# Patient Record
Sex: Female | Born: 1935
Health system: Southern US, Community
[De-identification: ages and names within clinical notes are randomized; demographics above are authoritative.]

## PROBLEM LIST (undated history)

## (undated) DIAGNOSIS — I350 Nonrheumatic aortic (valve) stenosis: Secondary | ICD-10-CM

## (undated) DIAGNOSIS — Z87442 Personal history of urinary calculi: Secondary | ICD-10-CM

## (undated) DIAGNOSIS — G309 Alzheimer's disease, unspecified: Secondary | ICD-10-CM

## (undated) DIAGNOSIS — D649 Anemia, unspecified: Secondary | ICD-10-CM

## (undated) DIAGNOSIS — R011 Cardiac murmur, unspecified: Secondary | ICD-10-CM

## (undated) DIAGNOSIS — I10 Essential (primary) hypertension: Secondary | ICD-10-CM

## (undated) DIAGNOSIS — T4145XA Adverse effect of unspecified anesthetic, initial encounter: Secondary | ICD-10-CM

## (undated) DIAGNOSIS — R609 Edema, unspecified: Secondary | ICD-10-CM

## (undated) DIAGNOSIS — D696 Thrombocytopenia, unspecified: Secondary | ICD-10-CM

## (undated) DIAGNOSIS — K759 Inflammatory liver disease, unspecified: Secondary | ICD-10-CM

## (undated) DIAGNOSIS — M1990 Unspecified osteoarthritis, unspecified site: Secondary | ICD-10-CM

## (undated) DIAGNOSIS — F0280 Dementia in other diseases classified elsewhere without behavioral disturbance: Secondary | ICD-10-CM

## (undated) DIAGNOSIS — I872 Venous insufficiency (chronic) (peripheral): Secondary | ICD-10-CM

## (undated) DIAGNOSIS — T8859XA Other complications of anesthesia, initial encounter: Secondary | ICD-10-CM

## (undated) DIAGNOSIS — M8580 Other specified disorders of bone density and structure, unspecified site: Secondary | ICD-10-CM

## (undated) HISTORY — PX: TOTAL KNEE ARTHROPLASTY: SHX125

## (undated) HISTORY — PX: JOINT REPLACEMENT: SHX530

## (undated) HISTORY — PX: FRACTURE SURGERY: SHX138

## (undated) HISTORY — PX: ABDOMINAL HYSTERECTOMY: SHX81

---

## 1999-01-30 ENCOUNTER — Other Ambulatory Visit: Admission: RE | Admit: 1999-01-30 | Discharge: 1999-01-30 | Payer: Self-pay | Admitting: *Deleted

## 1999-11-21 ENCOUNTER — Emergency Department (HOSPITAL_COMMUNITY): Admission: EM | Admit: 1999-11-21 | Discharge: 1999-11-21 | Payer: Self-pay | Admitting: Emergency Medicine

## 1999-11-21 ENCOUNTER — Encounter: Payer: Self-pay | Admitting: Emergency Medicine

## 2000-03-25 ENCOUNTER — Other Ambulatory Visit: Admission: RE | Admit: 2000-03-25 | Discharge: 2000-03-25 | Payer: Self-pay | Admitting: *Deleted

## 2001-05-06 ENCOUNTER — Emergency Department (HOSPITAL_COMMUNITY): Admission: EM | Admit: 2001-05-06 | Discharge: 2001-05-07 | Payer: Self-pay | Admitting: Emergency Medicine

## 2001-05-06 ENCOUNTER — Encounter: Payer: Self-pay | Admitting: Emergency Medicine

## 2001-05-07 ENCOUNTER — Encounter: Payer: Self-pay | Admitting: Emergency Medicine

## 2001-05-10 ENCOUNTER — Encounter: Payer: Self-pay | Admitting: Urology

## 2001-05-10 ENCOUNTER — Encounter: Admission: RE | Admit: 2001-05-10 | Discharge: 2001-05-10 | Payer: Self-pay | Admitting: Urology

## 2001-05-14 ENCOUNTER — Ambulatory Visit (HOSPITAL_COMMUNITY): Admission: RE | Admit: 2001-05-14 | Discharge: 2001-05-14 | Payer: Self-pay | Admitting: Urology

## 2001-05-20 ENCOUNTER — Encounter: Payer: Self-pay | Admitting: Urology

## 2001-05-20 ENCOUNTER — Encounter: Admission: RE | Admit: 2001-05-20 | Discharge: 2001-05-20 | Payer: Self-pay | Admitting: Urology

## 2001-06-17 ENCOUNTER — Encounter: Payer: Self-pay | Admitting: Urology

## 2001-06-17 ENCOUNTER — Encounter: Admission: RE | Admit: 2001-06-17 | Discharge: 2001-06-17 | Payer: Self-pay | Admitting: Urology

## 2001-06-18 ENCOUNTER — Encounter: Admission: RE | Admit: 2001-06-18 | Discharge: 2001-06-18 | Payer: Self-pay | Admitting: Urology

## 2001-06-18 ENCOUNTER — Encounter: Payer: Self-pay | Admitting: Urology

## 2001-11-08 ENCOUNTER — Ambulatory Visit (HOSPITAL_BASED_OUTPATIENT_CLINIC_OR_DEPARTMENT_OTHER): Admission: RE | Admit: 2001-11-08 | Discharge: 2001-11-08 | Payer: Self-pay | Admitting: Orthopedic Surgery

## 2002-04-28 HISTORY — PX: LUNG LOBECTOMY: SHX167

## 2002-11-15 ENCOUNTER — Encounter: Admission: RE | Admit: 2002-11-15 | Discharge: 2002-11-15 | Payer: Self-pay | Admitting: Geriatric Medicine

## 2002-11-15 ENCOUNTER — Encounter: Payer: Self-pay | Admitting: Geriatric Medicine

## 2002-11-22 ENCOUNTER — Encounter: Admission: RE | Admit: 2002-11-22 | Discharge: 2002-11-22 | Payer: Self-pay | Admitting: Geriatric Medicine

## 2002-11-22 ENCOUNTER — Encounter: Payer: Self-pay | Admitting: Geriatric Medicine

## 2002-11-25 ENCOUNTER — Encounter: Admission: RE | Admit: 2002-11-25 | Discharge: 2002-11-25 | Payer: Self-pay | Admitting: Internal Medicine

## 2002-11-25 ENCOUNTER — Encounter: Payer: Self-pay | Admitting: Internal Medicine

## 2002-12-18 ENCOUNTER — Ambulatory Visit (HOSPITAL_COMMUNITY): Admission: RE | Admit: 2002-12-18 | Discharge: 2002-12-18 | Payer: Self-pay | Admitting: Geriatric Medicine

## 2002-12-18 ENCOUNTER — Encounter: Payer: Self-pay | Admitting: Geriatric Medicine

## 2003-02-01 ENCOUNTER — Encounter: Payer: Self-pay | Admitting: Orthopedic Surgery

## 2003-02-02 ENCOUNTER — Ambulatory Visit (HOSPITAL_COMMUNITY): Admission: RE | Admit: 2003-02-02 | Discharge: 2003-02-02 | Payer: Self-pay | Admitting: Orthopedic Surgery

## 2003-02-02 ENCOUNTER — Encounter: Payer: Self-pay | Admitting: Orthopedic Surgery

## 2003-02-06 ENCOUNTER — Encounter: Payer: Self-pay | Admitting: Orthopedic Surgery

## 2003-02-06 ENCOUNTER — Inpatient Hospital Stay (HOSPITAL_COMMUNITY): Admission: RE | Admit: 2003-02-06 | Discharge: 2003-02-10 | Payer: Self-pay | Admitting: Orthopedic Surgery

## 2003-03-30 ENCOUNTER — Encounter: Admission: RE | Admit: 2003-03-30 | Discharge: 2003-03-30 | Payer: Self-pay | Admitting: Thoracic Surgery

## 2003-04-25 ENCOUNTER — Encounter (INDEPENDENT_AMBULATORY_CARE_PROVIDER_SITE_OTHER): Payer: Self-pay | Admitting: Specialist

## 2003-04-25 ENCOUNTER — Inpatient Hospital Stay (HOSPITAL_COMMUNITY): Admission: RE | Admit: 2003-04-25 | Discharge: 2003-04-29 | Payer: Self-pay | Admitting: Thoracic Surgery

## 2003-04-29 HISTORY — PX: VASCULAR SURGERY: SHX849

## 2003-04-29 HISTORY — PX: TOTAL SHOULDER ARTHROPLASTY: SHX126

## 2003-05-11 ENCOUNTER — Encounter: Admission: RE | Admit: 2003-05-11 | Discharge: 2003-05-11 | Payer: Self-pay | Admitting: Thoracic Surgery

## 2003-06-01 ENCOUNTER — Encounter: Admission: RE | Admit: 2003-06-01 | Discharge: 2003-06-01 | Payer: Self-pay | Admitting: Thoracic Surgery

## 2003-07-13 ENCOUNTER — Encounter: Admission: RE | Admit: 2003-07-13 | Discharge: 2003-07-13 | Payer: Self-pay | Admitting: Thoracic Surgery

## 2003-10-10 ENCOUNTER — Encounter: Admission: RE | Admit: 2003-10-10 | Discharge: 2003-10-10 | Payer: Self-pay | Admitting: Thoracic Surgery

## 2004-03-09 ENCOUNTER — Encounter: Admission: RE | Admit: 2004-03-09 | Discharge: 2004-03-09 | Payer: Self-pay | Admitting: Orthopedic Surgery

## 2004-04-24 ENCOUNTER — Inpatient Hospital Stay (HOSPITAL_COMMUNITY): Admission: RE | Admit: 2004-04-24 | Discharge: 2004-04-26 | Payer: Self-pay | Admitting: Orthopedic Surgery

## 2006-08-04 ENCOUNTER — Encounter: Admission: RE | Admit: 2006-08-04 | Discharge: 2006-08-04 | Payer: Self-pay | Admitting: Geriatric Medicine

## 2006-09-22 ENCOUNTER — Ambulatory Visit: Payer: Self-pay | Admitting: Vascular Surgery

## 2006-11-11 ENCOUNTER — Ambulatory Visit (HOSPITAL_BASED_OUTPATIENT_CLINIC_OR_DEPARTMENT_OTHER): Admission: RE | Admit: 2006-11-11 | Discharge: 2006-11-11 | Payer: Self-pay | Admitting: Orthopedic Surgery

## 2006-12-22 ENCOUNTER — Ambulatory Visit: Payer: Self-pay | Admitting: Vascular Surgery

## 2007-02-08 ENCOUNTER — Ambulatory Visit: Payer: Self-pay | Admitting: Vascular Surgery

## 2007-02-15 ENCOUNTER — Ambulatory Visit: Payer: Self-pay | Admitting: Vascular Surgery

## 2007-05-10 ENCOUNTER — Ambulatory Visit: Payer: Self-pay | Admitting: Vascular Surgery

## 2007-05-17 ENCOUNTER — Ambulatory Visit: Payer: Self-pay | Admitting: Vascular Surgery

## 2007-11-16 ENCOUNTER — Ambulatory Visit: Payer: Self-pay | Admitting: Vascular Surgery

## 2008-05-25 ENCOUNTER — Encounter: Admission: RE | Admit: 2008-05-25 | Discharge: 2008-05-25 | Payer: Self-pay | Admitting: Geriatric Medicine

## 2009-10-12 ENCOUNTER — Encounter (INDEPENDENT_AMBULATORY_CARE_PROVIDER_SITE_OTHER): Payer: Self-pay | Admitting: Orthopedic Surgery

## 2009-10-12 ENCOUNTER — Ambulatory Visit: Payer: Self-pay | Admitting: Vascular Surgery

## 2009-10-12 ENCOUNTER — Ambulatory Visit: Admission: RE | Admit: 2009-10-12 | Discharge: 2009-10-12 | Payer: Self-pay | Admitting: Orthopedic Surgery

## 2010-05-18 ENCOUNTER — Encounter: Payer: Self-pay | Admitting: Thoracic Surgery

## 2010-09-10 NOTE — Assessment & Plan Note (Signed)
OFFICE VISIT   ANZAL, BARTNICK  DOB:  04/30/1930                                       05/17/2007  [id number]#:[id number]   The patient returns 1 week post laser ablation of her left greater  saphenous vein with multiple stab phlebectomies (greater than 30) for  severe venous insufficiency of the left leg with painful varicosities.  She has had an excellent early result with very mild discomfort in the  proximal thigh over the course of the greater saphenous vein, which has  improved significantly.  She has had no pain associated with the stab  phlebectomy sites and has had no change in distal edema.  She does not  have any throbbing or aching discomfort.  She has worn her elastic  compression stocking as prescribed.  I performed a venous duplex exam  today, and her left greater saphenous vein is totally occluded from just  proximal to the saphenofemoral junction down to the mid thigh at the  entrance site.  The deep venous flow is normal with no evidence of deep  venous obstruction.   EXAM:  she has no distal edemas in the leg and the stab phlebectomy  sites are healing nicely.  I have reassured her regarding these  findings.  She will continue to wear elastic compression stockings.  Return to see me in 6 months for final followup with bilateral venous  duplex exams.   [name], M.D.  Electronically Signed   JDL/MEDQ  D:  05/17/2007  T:  05/18/2007  Job:  [id number]

## 2010-09-10 NOTE — Assessment & Plan Note (Signed)
OFFICE VISIT   GENOA, FREYRE  DOB:  [birth year]                                       02/15/2007  WUXLK#:[id number]   Patient returns one week status post laser ablation of the right greater  saphenous vein with multiple stab phlebectomies in the thigh and calf.  She also required cannulation of the greater saphenous vein in two  separate areas because of some chronic mild obstruction in the mid  thigh.  She has had an excellent early result with no discomfort  associated with stab phlebectomies.  She has had some mild aching in the  thigh related to the laser ablation of the greater saphenous vein.  There is also some moderate bruising.  The biggest problem she had was  the elastic compression stocking which caused her pain in the thigh  because of the size of her thigh.  This required her cutting the  stocking down to the knee level and wrapping it with an Ace.   PHYSICAL EXAMINATION:  On exam today, there is no distal edema.  The  stab phlebectomy wounds are all healing nicely in the calf and thigh.  There is moderate bruising in the right thigh distally.   I performed a venous duplex exam in the office today, and the right  greater saphenous vein is totally occluded from near the saphenofemoral  junction to the knee.  Widely patent deep venous system.  No evidence of  venous obstruction.  Normal flow into the deep venous system.  She was  reassured regarding these findings and will return soon to have a  similar procedure performed on the contralateral left leg.   [name], M.D.  Electronically Signed   JDL/MEDQ  D:  02/15/2007  T:  02/16/2007  Job:  [id number]

## 2010-09-10 NOTE — Procedures (Signed)
DUPLEX DEEP VENOUS EXAM - LOWER EXTREMITY   INDICATION:  Follow up bilateral greater saphenous vein ablations.   HISTORY:  Edema:  No.  Trauma/Surgery:  Right greater saphenous vein ablation, 02/08/07.  Left  greater saphenous vein ablation, 05/10/07.  Both by Dr. Hart Rochester.  Pain:  No.  PE:  No.  Previous DVT:  No.  Anticoagulants:  No.  Other:   DUPLEX EXAM:                CFV   SFV   PopV  PTV    GSV                R  L  R  L  R  L  R   L  R  L  Thrombosis    o  o  o  o  o  o  o   o  +  +  Spontaneous   +  +  +  +  +  +  +   +  D  0  Phasic        +  +  +  +  +  +  +   +  D  0  Augmentation  +  +  +  +  +  +  +   +  D  0  Compressible  +  +  +  +  +  +  +   +  P  0  Competent     +  +  +  +  +  +  +   +  0  0   Legend:  + - yes  o - no  p - partial  D - decreased   IMPRESSION:  1. No evidence of deep venous thrombosis in bilateral lower      extremities.  2. Right greater saphenous vein status post ablation, appears to be      recannulized in a dual-branch system with intermittent chronic      thrombus throughout greater saphenous vein.  3. Left greater saphenous vein, status post ablation, shows no      evidence of flow from groin to distal insertion site.  4. Right short saphenous vein shows evidence of chronic thrombus.      Left shows evidence of reflux; however, the diameter is small.       _____________________________  Quita Skye. Hart Rochester, M.D.   AS/MEDQ  D:  11/16/2007  T:  11/16/2007  Job:  [id number]

## 2010-09-10 NOTE — Assessment & Plan Note (Signed)
OFFICE VISIT   TA, FAIR  DOB:  July 29, 1930                                       12/22/2006  [id number]#:[id number]   [name] returns now 3 months' post evaluation for severe venous  insufficiency of both lower extremities.  She has gross reflux in the  saphenous femoral junction bilaterally with reflux throughout the  greater saphenous veins and has had severe burning and heaviness in both  lower extremities as well as some itching discomfort.  The elastic  compression stockings are very difficult for her to wear and she has had  no improvement in her symptoms from these.  She has not been able to  obtain relief from pain medication or elevation of the legs and these  are definitely effecting her daily living.  She has had history of  thrombophlebitis in the right leg which has now resolved.   PHYSICAL EXAMINATION:  Blood pressure 195/96, heart rate 79,  respirations are 18.  Her severe varicosities involved with this both  thigh and calf bilaterally with the left thigh being worse than the  right and the right calf being worse than the left. There are also  severe spider veins and reticular veins near the lower third of the leg  and in the ankle with 1+ edema bilaterally.  She has 3+ dorsalis pedis  pulse.   We will proceed with precertification for bilateral laser ablation of  the great saphenous veins with stat phlebectomies beginning with the  right side.   [name], M.D.  Electronically Signed   JDL/MEDQ  D:  12/22/2006  T:  12/23/2006  Job:  [id number]

## 2010-09-10 NOTE — Op Note (Signed)
NAMEMARYLYN, [name]            ACCOUNT NO.:  [id number]   MEDICAL RECORD NO.:  [id number]          PATIENT TYPE:  AMB   LOCATION:  DSC                          FACILITY:  [facility]   PHYSICIAN:  Artist Pais. Weingold, M.D.DATE OF BIRTH:  03-16-1931   DATE OF PROCEDURE:  11/11/2006  DATE OF DISCHARGE:                               OPERATIVE REPORT   PREOPERATIVE DIAGNOSIS:  Displaced intra-articular fracture distal  radius left with carpal tunnel symptoms.   POSTOPERATIVE DIAGNOSES:  Displaced intra-articular fracture distal  radius left with carpal tunnel symptoms.   PROCEDURE:  Open reduction internal fixation of above with DVR plate and  screw, and left carpal tunnel release through separate incision.   SURGEON:  Artist Pais. Mina Marble, M.D.   ASSISTANT:  None.   ANESTHESIA:  General.   TOURNIQUET TIME:  50 minutes.   COMPLICATIONS:  None.   DRAINS:  None.   OPERATIVE REPORT:  The patient taken to operating suite.  After  induction of general anesthesia, left upper extremity was prepped and  draped in usual sterile fashion.  An Esmarch was used to exsanguinate  the limb.  Tourniquet inflated to 250 mm.  At this point in time  incision was made over the palpable tendon FCR longitudinally.  Skin was  incised 6 to 8 cm.  The sheath overlying the FCR was incised.  It was  retracted to the midline.  The radial artery lateral side, the fascia  overlying this area was incised.  Dissection was carried down to the  level of pronator quadratus.  This was subperiosteally stripped off the  lower aspect of the distal radius exposing the fracture site.  The  fracture had a large radial styloid fragment and some intra-articular  comminution along the ulnar side.  Reduction was performed, held with a  reduction clamp to stabilize the large radial styloid fragment.  A  DVRL  standard plate was then fixed to the volar aspect of the distal radius  through the slotted hole.  Intraoperative  fluoroscopy revealed good  reduction with both AP lateral and oblique view.  Remaining proximal  screws were placed followed by the smooth pegs distally.  Once this was  completed, the wound was irrigated.  Seconds incision was made in the  palmar aspect of the left hand in line with long femoral carpals  starting Kaplan's cardinal line.  Skin was incised, palmar fascia  identified and split, the distal edge of the transverse carpal lumen was  then divided under vision using curved blunt scissors, canal was  inspected.  There was significant amount of blood which was irrigated  out.  No osseous lesions or ganglions were present.  It too was then  loosely closed with 3-0 Prolene subcuticular stitch.  Steri-Strips, 4x4s  fluffs and a volar splint was applied.  The patient tolerated both  procedures well and went to recovery in stable the fashion.      Artist Pais Mina Marble, M.D.  Electronically Signed     MAW/MEDQ  D:  11/11/2006  T:  11/12/2006  Job:  [id number]

## 2010-09-10 NOTE — Assessment & Plan Note (Signed)
OFFICE VISIT   [name], [name]  DOB:  January 28, 1931                                       11/16/2007  [id number]#:[id number]   The patient returns for 48-month followup regarding bilateral laser  ablation procedures and multiple stab phlebectomies for severe venous  insufficiency in both legs, right worse than left.  She had between 20  and 30 stab phlebectomies in both lower extremities.  Both legs are  asymptomatic, she states, with no aching or throbbing and dramatically  improved from her preoperative state.  She has had no distal edema and  has not been wearing elastic compression stockings since her second  procedure.  She has also had no recurrent thrombophlebitis, which she  did have preop.  She has had no claudication or cardiac symptoms and  denies any TIAs.   PHYSICAL EXAMINATION:  Blood pressure 189/88, heart rate is 70,  respirations 14.  Carotid pulses 3+.  No audible bruits.  Chest:  Clear  to auscultation.  Abdomen is soft, nontender with no masses.  She has 3+  femoral, popliteal, and dorsalis pedis pulses palpable bilaterally.  Both legs have minimal distal edema with multiple spider veins, but no  large varicosities remaining.  There is no active ulceration or  hyperpigmentation.   Venous duplex exam was performed, which revealed total closure of the  left great saphenous vein from the saphenofemoral junction to the knee.  Left deep system is widely patent.  On the right side, the deep system  is widely patent.  There has been some recanalization in a branch and  dual system in the mid thigh extending up to the saphenofemoral  junction, but there is also partial closure in this area.  She did have  a complicated ablation on the right requiring 2 areas of cannulization,  although the immediate post ablation picture revealed this to be totally  noncompressible.   Right leg does have some recanalization, but she is totally asymptomatic  and no  treatment is indicated.  She will return to see [place] on a p.r.n.  basis.   [name], M.D.  Electronically Signed   JDL/MEDQ  D:  11/16/2007  T:  11/17/2007  Job:  [id number]

## 2010-09-13 NOTE — Discharge Summary (Signed)
[name], [name]                        ACCOUNT NO.:  [id number]   MEDICAL RECORD NO.:  [id number]                   PATIENT TYPE:  INP   LOCATION:  3309                                 FACILITY:  [facility]   PHYSICIAN:  Ines Bloomer, M.D.              DATE OF BIRTH:  Oct 23, 1930   DATE OF ADMISSION:  04/25/2003  DATE OF DISCHARGE:  04/29/2003                                 DISCHARGE SUMMARY   ADMISSION DIAGNOSIS:  Right lower lobe mass.   DISCHARGE DIAGNOSES:  1. 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  2. Hypertension.  3. Remote history of hepatitis.  4. Remote childhood kidney infection with no sequelae.  5. Positive PPD in the past, per patient report.  6. Query aortic stenosis versus sclerosis.  7. Previous uterine cancer.  8. Previous left knee replacement.   HOSPITAL PROCEDURES:  Right video-assisted thoracoscopic surgery, mini-  thoracotomy, wedge resection of right lower lobe lesion, completed on  April 25, 2003, by Dr. Edwyna Shell.   CONSULTATIONS:  None.   HISTORY OF PRESENT ILLNESS:  [name] is a 77 year old female who was  referred by Dr. [name] through Dr. Donata Clay for evaluation of a lung mass.  The patient was found to have preoperative chest x-ray done for a left knee  replacement, this mass was found.  Reportedly, there was a mass in the right  lower lobe and this was confirmed by a CT scan.  The patient had been  asymptomatic from this at the time of presentation.  She felt well and had  no significant cough, sputum production, fever or chills, weight loss,  shortness of breath, dyspnea on exertion, chest pain, hemoptysis, or other  symptoms relating to her pulmonary review.  The patient was seen by Dr.  Edwyna Shell in the clinic at which time he discussed a video-thorascopic surgery  for wedge resection of this mass.  The risks, benefits, and complications,  were discussed with the  patient at that time and she agreed to proceed with  surgery.  The surgery was tentatively planned for April 25, 2003, at  [facility].   HOSPITAL COURSE:  [name] was admitted to [facility] on April 25, 2003, and underwent right video-assisted thorascopic surgery, mini-  thoracotomy, wedge resection of right lower lobe lesion, completed by Dr.  Edwyna Shell.  The patient tolerated this procedure well and was extubated on the  operating room table without difficulty.  The patient was transferred to the  post anesthesia care unit in stable condition.  Once awake, alert, and  appropriate, the patient was transferred to 3300.   The patient remained stable on the night of surgery without any issues.   On postoperative day #1, the patient was feeling fairly well.  Her pain was  well controlled.  She denied any nausea or vomiting, shortness of breath,  or  other pulmonary problems.  Her chest x-ray showed no pneumothorax and  bibasilar atelectasis with some subcutaneous air.  Her anterior chest tube  was discontinued without difficulty on that day.   Over the next two hospital days, the patient continued to do quite well.  Her second chest tube was removed on postoperative day #2 without any  difficulty.  Her post chest tube pull chest x-ray showed no pneumothorax.  The patient's invasive lines were discontinued.  Her Foley was discontinued.  She was ambulating in the hallway with the mobility team.  She was  saturating well on room air.  She was tolerating a regular diet.   Discharge planning was initiated then on postoperative day #3 or April 28, 2003.   LABORATORY DATA:  The patient's laboratory values at the time of discharge  planning were as follows:  April 27, 2003, sodium 133, potassium 3.0,  chloride 97, CO2 31, glucose 124, BUN 8, creatinine 0.6.  The patient's  liver function tests were within normal limits.  CBC from December 29, reads  as follows:   WBC 7.1, hemoglobin 10.9, hematocrit 32.8, platelet count 156.  Chest x-ray at the time of discharge planning, showed residual atelectasis  of both lower lobes and no pneumothorax.   The patient was ambulating without difficulty.  She was tolerating a regular  diet.  She had good pain control with oral pain medications.  She had a  bowel movement and was urinating without difficulty.  The patient's  incisions were healing well without erythema or drainage.   We will continue as planned with discharge on April 29, 2003, pending  morning rounds and no change in clinical status.   DISCHARGE MEDICATIONS:  1. Hydrochlorothiazide 25 mg daily.  2. Potassium tablets 20 mEq twice daily for 5 days.  3. Tylox 1-2 tablets every 4-6 hours as needed for pain.   ALLERGIES:  No known drug allergies.   DISCHARGE INSTRUCTIONS:  1. Activity:  The patient should avoid driving.  She should avoid strenuous     activity or heavy lifting.  She should walk daily.  2. Diet:  No restrictions.  3. Wound care:  The patient may shower starting April 29, 2003.  She should     wash her incisions daily with soap and water.  4. Special instructions:  The patient should notify [facility] if she has any     increased redness, swelling, or drainage     from her incisions.  5. Followup appointments:  The patient is to see Dr. Edwyna Shell in 7-10 days     with a chest x-ray.  The [facility] office will call the patient with the     appointment date and time.      Carolyn A. Eustaquio Boyden.                  Ines Bloomer, M.D.    CAF/MEDQ  D:  04/28/2003  T:  04/28/2003  Job:  [id number]   cc:   Hal T. Stoneking, M.D.  [address]  [place], [place] [address]  Fax: [phone]   John L. Rendall III, M.D.  [address] [place]  [place] [address]  Fax: [phone]

## 2010-09-13 NOTE — Discharge Summary (Signed)
[name], [name]            ACCOUNT NO.:  [id number]   MEDICAL RECORD NO.:  [id number]          PATIENT TYPE:  INP   LOCATION:  0449                         FACILITY:  [facility]   PHYSICIAN:  John L. Rendall, M.D.  DATE OF BIRTH:  1931/02/25   DATE OF ADMISSION:  04/24/2004  DATE OF DISCHARGE:  04/26/2004                                 DISCHARGE SUMMARY   ADMISSION DIAGNOSES:  1.  End-stage osteoarthritis right shoulder.  2.  Hypertension.  3.  History of right lung hamartoma removed December 2004.  4.  Herniated nucleus pulposus lumbar spine.   DISCHARGE DIAGNOSES:  1.  Right shoulder hemiarthroplasty.  2.  History of hypertension.  3.  History of right lung hamartoma.  4.  History of herniated nucleus pulposus lumbar spine.   HISTORY OF PRESENT ILLNESS:  The patient is a 77 year old white female with  a history of left total knee arthroplasty and left shoulder arthroplasty  that presents with 3- to 4-year history of progressively worsening right  shoulder pain.  The right shoulder rotator cuff is nonreparable at the  present time.  The pain is intermittent, aching, over the right deltoid with  some radiation in to the neck at times.  The pain worsens with range of  motion.  It improves with occasional cortisone injection.  The patient is  able to sleep on her right side.   ALLERGIES:  No known drug allergies.   CURRENT MEDICATIONS:  The patient denies any.   SURGICAL PROCEDURE:  On April 24, 2004 the patient was taken to the OR by  Dr. Erasmo Leventhal, assisted by Rexene Edison, P.A.-C.  Under general anesthesia  the patient underwent a right shoulder hemiarthroplasty with a CTA  arthroplasty component.  The patient tolerated the procedure well, there  were no complications, and the following components were implanted:  A  global advantage 56 x 23 mm CTA head with a size 12 145 length humeral stem.  The patient tolerated the procedure well, no complications.  Transferred  to  recovery room in good condition.   HOSPITAL COURSE:  On April 24, 2004 the patient was admitted to [facility] under the care of Dr. Jonny Ruiz Rendall.  The patient was taken to  the OR where a right shoulder hemiarthroplasty was performed.  The patient  tolerated the procedure well.  She was transferred to the recovery room and  then to the orthopedic floor in good condition with IV antibiotics and pain  medications.  The patient was able to transition over to p.o. medications  and was quite comfortable after a 2-day hospital stay.  Her wound was benign  for any signs of infection.  Her arm was neuromotor-vascularly intact.  The  incision was well approximated with staples and the patient was comfortable  so she was discharged to home in good condition with follow-up in 1 week,  with good control with p.o. pain medications.   LABORATORY DATA:  CBC on December 30:  WBC 6.9, hemoglobin 11.3, hematocrit  32.8, platelets 127.  Routine chemistries:  Sodium 136, potassium 3.5,  glucose 131 -  felt to be postoperative stress.   MEDICATIONS UPON DISCHARGE FROM ORTHOPEDICS FLOOR:  1.  Colace 100 mg p.o. b.i.d.  2.  Trinsicon one tablet p.o. t.i.d.  3.  Benadryl 25 mg p.o. q.6h. p.r.n. itching.  4.  Percocet one or two tablets q.4-6h. p.r.n.  5.  Tylenol 650 mg p.o. q.6h. p.r.n.  6.  Robaxin 500 mg p.o. q.6h. p.r.n.  7.  Restoril 15 mg p.o. q.h.s. p.r.n.   DISCHARGE INSTRUCTIONS:  1.  The patient is to use routine medications.  2.  Percocet 5 mg one or two tablets q.4-6h. for pain if needed.  3.  Activity:  Ss instructed by the therapist.  4.  Diet:  No restrictions.  5.  Wound care:  The patient is to keep the wound clean, change dressing      every-other day.  If any concerns from infections or any other problems,      the patient is to call Dr. Sable Feil office for advice.  6.  Follow-up:  The patient should call [phone] for a follow-up appointment      in 2 weeks with Dr.  [name] in his office.   CONDITION UPON DISCHARGE TO HOME:  Listed as improved and good.      RWK/MEDQ  D:  05/06/2004  T:  05/06/2004  Job:  [id number]

## 2010-09-13 NOTE — Op Note (Signed)
NAMEJALEN, [name]                        ACCOUNT NO.:  [id number]   MEDICAL RECORD NO.:  [id number]                   PATIENT TYPE:  INP   LOCATION:  2550                                 FACILITY:  [facility]   PHYSICIAN:  John L. Rendall III, M.D.           DATE OF BIRTH:  August 15, 1930   DATE OF PROCEDURE:  02/06/2003  DATE OF DISCHARGE:                                 OPERATIVE REPORT   PREOPERATIVE DIAGNOSIS:  Osteoarthritis left knee.   PROCEDURE:  Left LCS total knee replacement.   POSTOPERATIVE DIAGNOSIS:  Osteoarthritis left knee.   SURGEON:  John L. Rendall, M.D.   ASSISTANT:  Madilyn Fireman, P.A.-C.   ANESTHESIA:  General.   PATHOLOGY:  The patient has bone crumbling on the lateral femoral cortex and  bear bone on the medial femoral weightbearing surface.  The knee is inflamed  and arthroscopy in the last six weeks really was unable to help with this  pathology and the total knee was recommended at that time.   DESCRIPTION OF PROCEDURE:  Under general anesthesia, a midline incision was  made after a routine prep and drape and application of sterile tourniquet.  The patella was everted.  The femur was sized at a standard plus.  Using the  first tibial guide, a proximal tibial resection was carried out.  Using the  femoral guide, an intercondylar drill hole was placed.  Using the second  femoral guide, the anterior and posterior flare of the distal femur were  resected with a balance 10 mm flexion gap.  Intermedullary guide was then  used and a distal femoral cut was made with a 10 mm extension gap after the  second cut.  The recessing guide was then used.  Lamina spreaders were then  inserted.  Remnants of the menisci and cruciates were resected and spurs off  the back of the femoral condyle were removed with osteotome and mallet.  Attention was then turned to the proximal tibia which was exposed with  Meckel posteriorly, Homan laterally, and Rake medially.  It was sized  at a  #4.  Center peg hole was placed and then trial reduction of a #4 tibia, 10  mm rotating bearing, and standard plus femur revealed excellent fit and  alignment.  Patella was osteotomized and three peg holes placed there.  Bony  surfaces were then prepared with pressure irrigation and all components were  then cemented in place.  In the process of driving down the tibial  component, a crack occurred in the metaphysis of the medial tibia involving  about 30% of the bone by estimate.  This was held together with bone clamp  in a near anatomic position while the cement hardened.  Alpha BSM was  injected and bone graft was used so that when it was done, you could not  tell there had been a crack.  To further stabilize this area, a Synthes 7.3  screw  cannulated was placed transversely across the proximal tibia from  medial to lateral directly behind the tibial tubercle.  The screw was placed  percutaneously medially and further compressed this area. It was inserted  just prior to cement hardening.  Once this was completed and the cement  hardened, excess cement was removed.  The tourniquet was let down at one  hour.  Excess cement was removed.  Minor debridement was carried out. The  wound was copiously irrigated with saline and the antibiotic solution. It  was then closed in layers with #1  Tycron, #1 Vicryl, 2-0 Vicryl, and skin clips.  It should be noted that upon  completion of stabilization of the knee, it was stable through full range of  motion to varus valgus stress, anterior posterior drawer, and the fracture  was solidly fixed.                                               John L. Dorothyann Gibbs, M.D.    Renato Gails  D:  02/06/2003  T:  02/06/2003  Job:  [id number]

## 2010-09-13 NOTE — H&P (Signed)
[name], [name]                        ACCOUNT NO.:  [id number]   MEDICAL RECORD NO.:  [id number]                   PATIENT TYPE:  INP   LOCATION:  NA                                   FACILITY:  [facility]   PHYSICIAN:  Ines Bloomer, M.D.              DATE OF BIRTH:  [birth year]   DATE OF ADMISSION:  DATE OF DISCHARGE:                                HISTORY & PHYSICAL   HISTORY OF PRESENT ILLNESS:  This is a 77 year old female who was referred  by Dr. [name] to Dr. Donata Clay for evaluation of a lung mass.  This was  found on preoperative chest x-ray done for left knee replacement.  Reportedly there was a mass in the right lower lobe and this was confirmed  by CT scan.  The patient has been asymptomatic from this.  She feels well  and has had no significant cough, sputum productive, fever, chills,  unexplained weight loss, shortness of breath, dyspnea on exertion, chest  pain, hemoptysis or other symptoms related to her pulmonary review.  She has  a remote history of bronchitis approximately 20 years ago.  She also states  that she has worked as a Agricultural engineer and has always tested positive  for tuberculosis, presumably by PPD but chest x-ray has never confirmed any  significant lesion.  She is admitted this hospitalization per Dr. Edwyna Shell for  a video-assisted thoracoscopy and wedge resection of the mass.  Further  intervention will be deemed as necessary at the time of surgical  exploration.   PAST MEDICAL HISTORY:  1. Hypertension.  2. Remote history of hepatitis, she is uncertain of the type or etiology.  3. Remote childhood kidney infection with no sequelae.  4. Positive PPD per patient report.   PAST SURGICAL HISTORY:  Hysterectomy 15 years ago.  She said that she did  have cancer but required no further adjuvant therapies.  She has also had  left knee replacement in June of 2004 and a Cesarean section X1.   CURRENT MEDICATIONS:  1. Hydrochlorothiazide 25 mg  p.o. daily.  2. Percocet PRN.   ALLERGIES:  No known drug allergies.   REVIEW OF SYMPTOMS:  As per the history of present illness, otherwise  noncontributory.   FAMILY HISTORY:  She has a daughter with breast cancer, one sister also died  of cancer of uncertain type.   SOCIAL HISTORY:  She is married, retired.  She uses no alcohol.  She has  never smoked cigarettes.   PHYSICAL EXAMINATION:  VITAL SIGNS:  Blood pressure 150/100, pulse 88,  respirations 16.  GENERAL APPEARANCE:  She is somewhat anxious, 77 year old female in no acute  distress.  HEENT:  Normocephalic, atraumatic, pupils equal, round, reactive to light.  Extraocular movements intact. Pharynx is clear.  She has partial upper and  lower plates.  No lesions, no exudates, no erythema.  NECK:  Supple with no jugular venous distention.  She does have a faint  right carotid bruit versus transmitted murmur.  No lymphadenopathy.  CHEST:  Clear breath sounds without wheezes, rhonchi, rales.  Lungs are  symmetrical on inspiration.  CARDIAC:  Regular rate and rhythm, 2/6 harsh systolic aortic murmur.  No  rubs, gallops.  ABDOMEN:  Soft, obese, nontender, no obvious masses.  GENITOURINARY/RECTAL: Examinations are deferred.  EXTREMITIES:  No cyanosis, clubbing or edema.  She does have varicose veins.  NEUROLOGICAL:  Examination is nonfocal.  Her gait is slightly unsteady due  to recent knee surgery causing a slight limp.   ASSESSMENT:  Right sided lung lesion.   OTHER DIAGNOSES:  1. Query aortic stenosis versus sclerosis.  2. Varicose veins.  3. Remote history of hepatitis.  4. Hypertension.  5. Remote childhood kidney infection.  6. Positive PPD testing.  7. Previous uterine cancer.  8. Previous left knee replacement.   PLAN:  Thoracoscopic intervention with possible resection.      [name], P.A.-C.                    Ines Bloomer, M.D.    Sherryll Burger  D:  04/20/2003  T:  04/21/2003  Job:  [id number]   cc:    Hal T. Stoneking, M.D.  [address]  [place], [place] [address]  Fax: [phone]   John L. Rendall III, M.D.  [address] [place]  [place] [address]  Fax: [phone]

## 2010-09-13 NOTE — Op Note (Signed)
[facility]  Patient:    [name], [name] Visit Number: [id number] MRN: [id number]          Service Type: DSU Location: DAY Attending Physician:  Londell Moh Dictated by:   Jamison Neighbor, M.D. Proc. Date: 05/14/01 Admit Date:  05/14/2001 Discharge Date: 05/14/2001                             Operative Report  PREOPERATIVE DIAGNOSIS:  Right flank pain with possible right ureteral calculus.  POSTOPERATIVE DIAGNOSIS:  Passed right ureteral calculus.  PROCEDURE:  Cystoscopy, right retrograde, right rigid and flexible ureteroscopy, and right double-J catheter insertion.  SURGEON:  Jamison Neighbor, M.D.  ANESTHESIA:  General.  COMPLICATIONS:  None.  DRAIN:  6 French x 26 cm double-J catheter.  BRIEF HISTORY:  This 77 year old female developed right-sided abdominal pain and was thought to have a possible kidney stone.  The patient complained of pain in the right flank and right portion of the abdomen.  A plain film showed multiple calcifications in the bony pelvis that were thought to most likely represent phlebolith, although it was thought that there was a small stone in the distal right ureter.  In addition, there was a calcification at the L5 level thought to be a phlebolith but possibly a stone.  There was also an additional calcification thought to be a possible lower pole kidney stone. The patients symptoms were consistent with a ureteral calculus.  It was felt that this would have a chance to pass.  The patients pain became so severe that she felt that she needed immediate intervention.  The decision was made to place a stent today and to perform diagnostic retrogrades and ureteroscopy to determine if the patient has an obstructing stone.  The patient understands the risks and benefits of the procedure and gave full and informed consent.  DESCRIPTION OF PROCEDURE:  After the successful induction of general anesthesia, the  patient was placed in the dorsal lithotomy position and prepped with Betadine and draped in the usual sterile fashion.  The position of her legs was modified because she has had a recent injury, and the right leg has been in a brace.  This was placed in an essentially straight position without bending.  The cystoscope was inserted, and the bladder was carefully inspected.  It was free of any tumor or stones.  Both ureteral orifices were normal in configuration and location.  The right ureteral orifice was cannulated, and a retrograde study was performed.  This showed a normal caliber ureter without obvious filling defect.  A guidewire was passed up to the kidney.  A rigid ureteroscope was passed along the course of the ureter all the way up to the kidney with no stone or obstruction seen.  The scope was withdrawn, and no obstruction was identified.  Because there was a question of a possible stone within the kidney, the ureteral access sheath was inserted, and a flexible ureteroscope was passed into the kidney, and this was used to inspect all of the caliceal, and no calcifications could be seen.  The ureter was inspected one final time as the ureteroscope and access sheath were withdrawn.  A 6 French x 26 cm double-J catheter was passed up to the kidney, allowed to coil normally in the pelvis.  This was attached to a string and will be removed later on next week.  The patient was sent home with a  prescription of Lorcet Plus to take as needed for pain, Pyridium Plus to take as needed for burning or spasm, and a Septra DS 1 daily until the stent is removed.  The patient will be advised that her pain may be due to a passed kidney stone since there was a question of a UVJ stone on her films from May 10, 2001; however, if she still has pain with the stent in place, and the pain persists after the stent has been removed, she will need evaluation to determine if there may be another etiology  for her pain, namely musculoskeletal pain or GI-type pain. Dictated by:   Jamison Neighbor, M.D. Attending Physician:  Londell Moh DD:  05/14/01 TD:  05/16/01 Job: [id number]

## 2010-09-13 NOTE — Op Note (Signed)
[name], [name]            ACCOUNT NO.:  [id number]   MEDICAL RECORD NO.:  [id number]          PATIENT TYPE:  INP   LOCATION:  0003                         FACILITY:  [facility]   PHYSICIAN:  John L. Rendall III, M.D.DATE OF BIRTH:  1931-01-20   DATE OF PROCEDURE:  04/24/2004  DATE OF DISCHARGE:                                 OPERATIVE REPORT   PREOPERATIVE DIAGNOSES:  Osteoarthritis with complete avulsion  supraspinatus, infraspinatus right shoulder.   POSTOPERATIVE DIAGNOSES:  Osteoarthritis with complete avulsion  supraspinatus, infraspinatus right shoulder.   PROCEDURE:  CTA hemiarthroplasty right shoulder.   SURGEON:  John L. Rendall, M.D.   ASSISTANT:  Richardean Canal, P.A.   ANESTHESIA:  General.   PATHOLOGY:  The patient has an arthritic humeral head with a complete  avulsion of the supraspinatus and humeral head abutting the acromion. Some  infraspinatus avulsion as well.   DESCRIPTION OF PROCEDURE:  Under general anesthesia, the patient was placed  in a nearly supine position, head and shoulders elevated to 15 degrees, bump  under the left shoulder and the arm was prepared with DuraPrep and draped as  a sterile field. A single armboard was used to support the arm out to the  side in a 45 degree abducted position. The skin incision was marked from  just above the coracoid to the humeral insertion of the deltoid. Dissection  was carried through skin and subcutaneous tissues, deltopectoral interval  was opened and the cephalic vein retracted laterally. It did not bleed at  first but then in the middle of the case, had to be cauterized.  The  clavipectoral fascia was taken down, the subscapularis was exposed. It was  then taken down with electrocautery and tagged with sutures. It was  mobilized somewhat and the humeral head was then dislocated anteriorly using  the template, it was osteotomized. With the proximal humerus elevated, the  shaft was reamed with 6, 8, 10  and 12 reamers, the 12 gave it good fit in  the distal 1/3.  Rasps were then used and a 12 seemed to fit very well. A  trial of humeral heads was then done after osteotomizing the greater  tuberosity. The best fit was obtained with the 56 x 23 mm head and this  appeared nice and stable within the shoulder. At this point, the trial was  removed and the medial humeral neck was drilled in four places for #1 Tycron  sutures to suture the subscapularis directly to bone. The permanent  component was then inserted, 12 mm diameter, length 145.  Upon seating it,  it tended to piston slightly. It was then removed and bone graft was  obtained from the under surface of the removed humeral head and this was  packed down the hole.  However, this still was not enough and a 5 mL graft  was obtained and that was placed down the hole and then the humeral stem and  head were then impacted and gave an excellent fit alignment with good  stability.  It should be noted that osteotomy of the humeral neck occurred  in a 30  degree external rotated position thereby seating the humeral head  nearly perfectly.  Upon final seating of the permanent components, it rested  very well near the proximal humerus and care was taken to remove all  osteophytes of the old humeral head around the  capsule. The subscapularis was then sutured to the humeral neck medially.  Deltopectoral interval was closed with #0 Vicryl, subcu with 2-0 Vicryl and  skin with clips. Operative time approximately 70 minutes. The patient  tolerated the procedure well and returned to recovery in good condition.      JLR/MEDQ  D:  04/24/2004  T:  04/24/2004  Job:  [id number]

## 2010-09-13 NOTE — H&P (Signed)
[name], [name]                        ACCOUNT NO.:  [id number]   MEDICAL RECORD NO.:  [id number]                   PATIENT TYPE:  INP   LOCATION:  NA                                   FACILITY:  [facility]   PHYSICIAN:  John L. Rendall, M.D.               DATE OF BIRTH:  Oct 09, 1930   DATE OF ADMISSION:  02/06/2003  DATE OF DISCHARGE:                                HISTORY & PHYSICAL   CHIEF COMPLAINT:  Left knee pain since June.   HISTORY OF PRESENT ILLNESS:  This 77 year old white female patient presented  to Dr. [name] initially with pretty much sudden onset left knee pain.  She  was diagnosed initially with osteoarthritis, the phlebitis, and then back to  osteoarthritis and eventually had an MRI which showed she had some  degenerative meniscal tears and arthritis. She was supposed to go for a trip  to [place] in October and elected to have a knee arthroscopy on December 28, 2002.  Since that time, the pain has been getting progressively worse.   At this point, the pain is intermittent but diffuse about the joint.  It is  described as a throbbing sensation with no radiation.  The pain increases  with walking or attempting to bend her knee and decreases with Tylenol and  pain medications.  There is no popping, catching, grinding, locking, or  giving way, but the knee does swell at times.  She said something seems to  have broken off in the last few days because she has not been able to bend  it too much since Saturday.  She has been using a walker since her knee  arthroscopy in September.  She did get a cortisone injection in the knee in  the past, and that provided no relief.   ALLERGIES:  No known drug allergies.   CURRENT MEDICATIONS:  1. Hydrochlorothiazide 25 mg 1 tablet p.o. q.a.m.  2 . Multivitamins 1 tablet p.o. q.a.m.  1. Chondroitin and glucosamine 1 tablet p.o. q.a.m.  2. Tylenol 325 mg 1 to 2 p.o. q.4h. p.r.n. for pain.   PAST MEDICAL HISTORY:  1.  Hypertension diagnosed recently when she had some back pain.  2. Herniated disk in her lumbar spine May 2004, treated with epidural     steroid.   She denies any history of diabetes mellitus, thyroid disease, hiatal hernia,  reflux, peptic ulcer disease, heart disease, asthma, or any other chronic  medical condition.   PAST SURGICAL HISTORY:  1. Cesarean section.  2. Hysterectomy.  3. Left knee arthroscopy December 28, 2002 by Carlisle Beers. Rendall, M.D.  4. Abdominal lysis of adhesions approximately 30 years ago.   She denies any complications from the above-mentioned procedures.   SOCIAL HISTORY:  She denies any history of cigarette smoking, alcohol use,  or drug use.  She is married and has two daughters.  She and her husband  live in  a one-story house with their dachshund.  It has two steps into the  main entrance.  She is a retired Lawyer and retired about two years ago.   PRIMARY CARE PHYSICIAN:  Hal T. Pete Glatter, M.D., phone number [phone].   FAMILY HISTORY:  Her mother died at the age of 84 with old age. She says she  did have many medical problems, but she does not know what they were.  Her  father died at age of 71 with a stroke.  She had one brother who died at the  age of 29 with rheumatoid arthritis, peripheral vascular disease and heart  failure.  She had a sister who died at age 28 with stomach cancer.  Her  daughter is 2 and is now survivor of breast cancer, and she has another  daughter, age 64, who is alive and healthy.   REVIEW OF SYSTEMS:  She does have partial plate dentures on both upper and  lower jaw line.  She does wear glasses.  She did have hepatitis at age 47  but no problem since that time, having some constipation with the pain  medicines, and she treats that with a vegetable laxative.  She does have  nocturia once during the night.  She has lost 42 pounds since February of  this year by being on the Atkins diet.  She does not have a living will nor  a  power of attorney.  All other systems are negative and noncontributory.   PHYSICAL EXAMINATION:  GENERAL:  Well developed, well nourished, overweight  white female in no acute distress.  She walks very slowly with the use of a  walker and a significant left-sided limp.  She is accompanied by her  husband.  Mood and affect are appropriate.  VITAL SIGNS:  Height 5 feet 6 inches, weight 215 pounds.  BMI is 34.  Temperature 96.3 degrees F, pulse 96, respirations 16, blood pressure  154/92.  HEENT:  Normocephalic and atraumatic without frontal or maxillary sinus  tenderness to palpation.  Glasses in place.  Conjunctivae pink, sclerae  anicteric.  PERRLA.  EOMs intact.  No visible external ear deformity.  Hearing grossly intact.  A moderate amount of cerumen in each ear canal, but  tympanic membranes are pearly grey with good light reflex.  Nose and nasal  septum midline.  Nasal mucosa pink and moist without exudates or polyps  noted.  Buccal mucosa pink and moist.  Good dentition, dentures in place.  Pharynx without erythema or exudate.  Tongue and uvula midline.  Tongue  without fasciculations, and uvula rises equally with phonation.  NECK:  No masses or lesions noted.  Trachea midline.  No palpable  lymphadenopathy nor thyromegaly.  Carotids +2 bilaterally without bruits.  Full range of motion and nontender to palpation along the cervical spine.  CARDIOVASCULAR:  Heart rate and rhythm regular.  S1 and S2 present without  rubs, clicks, or murmurs noted.  RESPIRATORY:  Respirations even and unlabored.  Breath sounds clear to  auscultation bilaterally without rales or wheezes noted.  ABDOMEN:  Bowel sounds present x 4 quadrants.  Soft, nontender to palpation  without hepatosplenomegaly or CVA tenderness.  Femoral pulses +2  bilaterally.  BREASTS/GU/RECTAL/PELVIC:  These exams deferred at this time. MUSCULOSKELETAL:  She has no obvious deformity bilateral upper extremities  with full range of  motion of these extremities without pain.  Radial pulses  +2 bilaterally.  She has full  painless range of motion of both hips,  ankles, and toes.  DP and PT pulses are +2.  She does have +1 to 2 pitting  edema of both lower extremities.  She does have very prominent venous  structure of both lower extremities.  Right knee has full extension and  flexion to 115 degrees with minimal crepitus.  No pain with palpation along  the joint line.  No effusion.  Stable to varus and valgus stress and  negative for anterior drawer.  No calf pain with palpation and negative  Homan's sign.  Left knee she keeps resting at about a 30 to 35 degree angle.  She will not let it bend more than that, and she will not straighten it all  the way.  She has tenderness to palpation on both the medial and lateral  joint line.  Small effusion in the knee.  Unable to check varus or valgus  stressing due to splinting and guarding.  No calf pain with palpation.  Negative Homan's.  Leg otherwise neurovascularly intact.  NEUROLOGIC:  Alert and oriented x 3.  Cranial nerves II-XII grossly intact.  Strength 5/5 bilateral upper and lower extremities.  Rapid alternating  movements intact.  Deep tendon reflexes 2+ bilateral upper and right lower  extremity.  Left lower extremity unable to be assessed other than patellar  due to her inability to flex her knee.   RADIOLOGICAL FINDINGS:  X-ray taken in June 2004 of that left knee show bone-  against-bone contact in the medial compartment of the left knee.   MRI in August showed complex tears above the medial and lateral meniscus and  bare bone in both compartments.  Arthroscopy had shown lots of debris in the  joint.   IMPRESSION:  1. End-stage osteoarthritis medial compartment left knee.  2. Hypertension.  3. History of herniated disk, lumbar spine.   PLAN:  Ms. Kateri Plummer will be admitted to [facility] on February 06, 2003, where she will undergo a left total knee  arthroplasty by Dr. Jonny Ruiz L.  Rendall.  She will undergo all the routine preoperative laboratory tests and  studies prior to this procedure.  Dr. Pete Glatter or [facility] will  be notified if we have any medical issues that need to be followed.      Legrand Pitts Duffy, P.A.                      John L. [name], M.D.    KED/MEDQ  D:  01/30/2003  T:  01/30/2003  Job:  [id number]

## 2010-09-13 NOTE — Op Note (Signed)
[facility]  Patient:    BURKLEY, DECH Visit Number: [id number] MRN: [id number]          Service Type: DSU Location: [facility] Attending Physician:  Carolan Shiver Ii Dictated by:   Carlisle Beers. Dorothyann Gibbs, M.D. Proc. Date: 11/08/01 Admit Date:  11/08/2001 Discharge Date: 11/08/2001                             Operative Report  PREOPERATIVE DIAGNOSIS:  Chronic impingement syndrome, right shoulder.  POSTOPERATIVE DIAGNOSIS:  Rotator cuff tear with chronic impingement syndrome, right shoulder.  OPERATION PERFORMED:  Arthroscopic glenohumeral debridement with debridement of labrum and partial thickness tear of biceps tendon followed by debridement of rotator cuff tear.  Subacromial decompression with acromioplasty and takedown of acromioclavicular joint spurs.  SURGEON:  John L. Dorothyann Gibbs, M.D.  ANESTHESIA:  General.  DESCRIPTION OF PROCEDURE:  Under general anesthesia, the patient was placed in the right lateral decubitus position.  The arm was suspended by the fishing pole shoulder holder in a 30 degree abducted, 30 degree forward flexed position.  Surgical landmarks were marked out with a marking pen.  An 18 gauge needle was used to find the anterior lateral acromion and the glenohumeral joint posteriorly.  First the glenohumeral joint was entered posteriorly, Wissinger rod technique and anterior entry portal was made.  The glenohumeral joint showed mild arthritic damage between humeral head and middle of glenoid. Significant fraying of the glenoid labrum was debrided with the intra-articular shaver.  A partial thickness biceps tear was debrided. Significant fraying of a retracted approximately 2.5 cm rotator cuff tear was found and this was debrided removing the fraying.  Once this was completed, the glenohumeral joint portion was ended and the subacromial was begun.  The undersurface of the acromion was found to be frayed.  There was  significant bursal thickening.  This was debrided.  With the marking of the anterolateral acromion as a guide, the [employer] wand was used to remove periosteum from the undersurface of the acromion, then a 6 mm Linvatec bur was used to perform the acromioplasty.  There was a significant spur near the acromioclavicular joint.  This was taken down considerably but in passing the [employer] want about this, a significant small arteriole was found to cut loose which was quite a nuisance for the next 10 to 15 minutes.  Once this came under control again, completion of the spur takedown was finished.  The decompression was considered complete.  A curved rasp was used to further smooth rough edges along the area that was burred.  Once this was completed, puncture holes were closed with 4-0 nylon.  The shoulder was infiltrated with Marcaine with morphine with epinephrine and a sterile dressing and arm sling were applied. The patient was transferred to the recovery room in good condition. Dictated by:   Carlisle Beers. Dorothyann Gibbs, M.D. Attending Physician:  Carolan Shiver Ii DD:  11/08/01 TD:  11/09/01 Job: [id number]

## 2010-09-13 NOTE — Op Note (Signed)
NAMESADA, MAZZONI                        ACCOUNT NO.:  [id number]   MEDICAL RECORD NO.:  [id number]                   PATIENT TYPE:  INP   LOCATION:  NA                                   FACILITY:  [facility]   PHYSICIAN:  Ines Bloomer, M.D.              DATE OF BIRTH:  14-Apr-1931   DATE OF PROCEDURE:  04/25/2003  DATE OF DISCHARGE:                                 OPERATIVE REPORT   PREOPERATIVE DIAGNOSIS:  Right lower lobe lesion.   PROCEDURE:  Bronchial hamartoma, right lower lobe.   OPERATION PERFORMED:  Right video-assisted thoracoscopic surgery, min-  thoracotomy, wedge resection of right lower lobe lesion.   SURGEON:  Ines Bloomer, M.D.   FIRST ASSISTANT:  Carolyn A. Eustaquio Boyden.   General anesthesia.   After percutaneous insertion of all monitoring lines, patient turned to the  right lateral thoracotomy position, was prepped and draped in the usual  sterile manner.  Two trocar sites were made in the anterior and posterior  axillary line at the seventh intercostal space.  Two trocars were inserted,  a 30 degree scope was inserted.  The lesion was seen in the medial basilar  segment of the right lower lobe.  A 4 cm incision was made over the sixth  intercostal space.  The serratus was split and a small Tuffier was inserted  in the intercostal space.  The lesion was grasped with a Davol lung cap and  resected with the EZ-45 stapler with several applications.  No bleeding was  seen.  Two chest tubes were brought through the trocar sites and tied in  place with 0 silk.  The lung was re-expanded.  The chest was closed with two  paracostals of #1 Vicryl in the muscle layer.  A Marcaine block was done in  the usual fashion.  On-Q catheters were inserted in the usual fashion.  The  subcutaneous tissue was closed with 2-0 Vicryl and the subcuticular with 3-0  Vicryl.  Dermabond for the skin.  The patient returned to the recovery room  in stable condition.                                      Ines Bloomer, M.D.    DPB/MEDQ  D:  04/25/2003  T:  04/25/2003  Job:  [id number]   cc:   Jonny Ruiz L. Rendall III, M.D.  [address] [place]  [place] [address]  Fax: [phone]

## 2010-09-13 NOTE — H&P (Signed)
[name], [name]            ACCOUNT NO.:  [id number]   MEDICAL RECORD NO.:  [id number]          PATIENT TYPE:  INP   LOCATION:  NA                           FACILITY:  [facility]   PHYSICIAN:  John L. [name], M.D.  DATE OF BIRTH:  10/02/1930   DATE OF ADMISSION:  DATE OF DISCHARGE:                                HISTORY & PHYSICAL   CHIEF COMPLAINT:  Right shoulder pain for the last 3-4 years.   HISTORY OF PRESENT ILLNESS:  This 77 year old white female patient presented  to Dr. Priscille [name] with a 3-4 year history of gradual onset progressive right  shoulder pain.  She has had no known injury or prior surgery to her shoulder  but did have a documented rotator cuff tear on MRI approximately 2 years  ago.  She was going to have it repaired, but then the shoulder got better,  and she just put it off and put it off until at her last few visits, a new  MRI was obtained which showed a completely absent rotator cuff.  She has  subsequently had problems with her left shoulder and had a left shoulder  scope by Dr. Priscille [name] on November 08, 2001.   This right shoulder started up another month ago after she had been moving  some heavy furniture.  The pain now is an intermittent aching sensation over  the right deltoid with radiation at times up into her neck.  Pain is worse  with range of motion of that shoulder, and she cannot lie on that side at  night.  Pain is relieved with cortisone injection which she received just a  couple of weeks ago.  She has no paraesthesias associated with the pain.   ALLERGIES:  No known drug allergies.   CURRENT MEDICATIONS:  None.   PAST MEDICAL HISTORY:  1.  Hypertension, controlled with diet.  2.  History of herniated disk, lumbar spine, treated in May 2004 with      epidural steroids.   She denies any history of diabetes mellitus, thyroid disease, hiatal hernia,  peptic ulcer disease, reflux, heart disease, asthma, or any other chronic  medical condition  other than noted previously.   PAST SURGICAL HISTORY:  1.  Cesarean section.  2.  Hysterectomy, Dr. Roberto [name] in 1990.  3.  Left total knee arthroscopy, December 28, 2002, by Dr. Jonny Ruiz L. [name].  4.  Abdominal lysis of adhesions approximately 30 years ago.  5.  Left total knee arthroplasty, February 06, 2003, by Dr. Jonny Ruiz L. [name].  6.  Manipulation left total knee arthroplasty by Dr. Jonny Ruiz L. [name],      May 24, 2003.  7.  Right mini thoracotomy and wedge resection of right lower lobe lesion of      her lung.  It was a hamartoma, April 25, 2003, by Dr. Karle [name].  8.  Left shoulder arthroscopy and debridement of rotator cuff tear, November 08, 2001, by Dr. Jonny Ruiz L. [name].   She denies any complications from the above-mentioned procedures.   SOCIAL HISTORY:  She denies any history of cigarette  smoking, alcohol use,  or drug use.  She is married and has two daughters.  She and her husband  live in a one-story house with their dachshund.  Has two steps into the main  entrance.  She is a retired Lawyer.  Her medical doctor is Dr. Merlene [name],  and his phone number is [phone].   FAMILY HISTORY:  Mother died at age of 88 of old age.  She had many medical  issues, but she does not know what they were.  Father died at age of 73 with  a stroke.  Had one brother who died at age of 9 with rheumatoid arthritis,  peripheral vascular disease, and heart failure.  Sister who passed away at  age 3 with stomach cancer.  She has one daughter who is 49 and a survivor  of breast cancer and one age, 33, is alive and well.   REVIEW OF SYSTEMS:  She does have partial plate dentures, upper and lower  jaw line.  She wear glasses.  She had hepatitis, age 33, but no problems  since then.  She does have some problems with constipation due to pain  medications at times.  That is treated with a vegetable laxative.  She does  get up and have nocturia once during the night.  She was diagnosed  recently  with a heart murmur which is believed to be due to aortic stenosis which is  mild.  It was evaluated by Dr. Pete [name].  She has been told she has to take  antibiotics before any dental procedure because of this.  She does not have  a living will nor a power of attorney.  All other systems are negative and  noncontributory.   PHYSICAL EXAMINATION:  GENERAL:  Well-developed, well-nourished, overweight,  white female in no acute distress.  Walks without a limp.  Mood and affect  are appropriate.  Talks easily with the examiner.  Height 5 feet 6 inches.  Weight 248 pounds.  BMI is 39.  VITAL SIGNS:  Temp. 96.2 degrees F, pulse 68, respirations 18, and BP  190/102.  HEENT:  Normocephalic, atraumatic without frontal or maxillary sinus  tenderness to palpation.  Conjunctivae pink, sclerae anicteric.  PERRLA.  EOMs intact.  No visible external ear deformities.  Hearing grossly intact.  Tympanic membranes pearly-gray bilaterally with good light reflex.  Nose and  nasal septum midline.  No polyps or lesions noted.  Buccal mucosa pink and  moist.  Good dentition.  Pharynx without erythema or exudates.  Tongue and  uvula midline.  Tongue without fasciculations, and uvula rises easily with  phonation.  NECK:  No visible masses or lesions noted.  Trachea midline.  No palpable  lymphadenopathy nor thyromegaly.  Carotids +2 bilaterally without bruits.  Full range of motion and nontender to palpation along the cervical spine.  CARDIOVASCULAR:  Heart rate and rhythm regular.  S1 and S2 are present with  a grade 3/6 systolic murmur heard best over the right second intercostal  space, right sternal border.  RESPIRATORY:  Respirations even and unlabored.  Breath sounds clear to  auscultation bilaterally without rales or wheezes noted.  ABDOMEN:  Rounded abdominal contour with well-healed incision lines.  There  is one right under her right breast.  Bowel sounds present x 4 quadrants. Soft,  nontender to palpation without hepatosplenomegaly nor CVA tenderness.  Femoral pulses +1 bilaterally.  Nontender to palpation over the entire  vertebral column.  BREASTS/GU/RECTAL/PELVIC:  These exams deferred at  this time.  MUSCULOSKELETAL:  She has full range of motion of her bilateral hips,  ankles, and toes bilaterally.  DP and PT pulses are +2.  Right knee has full  extension and flexion about 100-110 degrees.  No pain with palpation along  the joint lines.  Stable to varus and valgus stress, negative anterior  drawer.  Left knee incision is intact.  She has full extension and flexion  about 100 degrees with no crepitus.  No pain with palpation along the joint  line.  Stable to varus and valgus stress, negative anterior drawer.  DP and  PT pulses are +2 bilaterally.  No calf pain with palpation.  Negative Homans  sign and +1 nonpitting edema both lower extremities.  Full range of motion  of her elbows, wrists, and fingers bilaterally.  Radial pulses are +2  bilaterally.  LEFT SHOULDER:  Minimal crepitus with range of motion of the shoulder.  She  has forward flexion of about 160 degrees, abduction about 120, good internal  and external rotation, and no pain with palpation or range of motion of that  left shoulder.  Impingement testing on that shoulder does not cause pain,  and she is strong.  RIGHT SHOULDER:  No pain with palpation over the [facility] joint or bicipital  tendon.  She has forward flexion only to about 120 degrees, abduction only  to 90.  Probably 15-30 degrees of internal rotation of that right shoulder  and maybe 5-10 degrees of external rotation.  Internal or external rotation  do cause pain.  Impingement testing on the right:  She is weak, and it does  cause pain.  NEUROLOGIC:  Alert and oriented x 3.  Cranial nerves 2-12 are grossly  intact.  Strength 5/5 bilateral upper and lower extremities.  Rapid  alternating movements intact.  Deep tendon reflexes 2+ bilateral upper  and  lower extremities.   RADIOLOGIC FINDINGS:  X-rays taken of her right shoulder in October 2005  reveal massive rotator cuff tear evidenced by narrowing of the space between  the humeral head and the acromion to just 2-3 mm.  There is congruity of the  humeral head with the acromion, and the glenoid does not appear deformed.   IMPRESSION:  1.  Osteoarthritis, right shoulder with massive rotator cuff tear.  2.  Obesity.  3.  Hypertension.  4.  History of herniated lumbar disk.  5.  History of left knee replacement.  6.  Aortic stenosis/heart murmur.   PLAN:  Ms. Sherley will be admitted to [facility] on April 24, 2004, where she will undergo a right shoulder hemiarthroplasty by Dr. Jonny Ruiz  L. [name].  She will undergo all the routine preoperative laboratory tests  and studies prior to this procedure.  She will check her blood pressure over  the next several days and if it remains elevated, we will contact Dr. Pete [name] to get back on medicines.  If we have any medical issues while she  is hospitalized, we will consult Dr. Pete [name].     Legrand Pitts   KED/MEDQ  D:  04/16/2004  T:  04/16/2004  Job:  [id number]

## 2010-09-13 NOTE — Discharge Summary (Signed)
NAMESHAVONE, [name]                        ACCOUNT NO.:  [id number]   MEDICAL RECORD NO.:  [id number]                   PATIENT TYPE:  INP   LOCATION:  5007                                 FACILITY:  [facility]   PHYSICIAN:  John L. Rendall, M.D.               DATE OF BIRTH:  February 11, 1931   DATE OF ADMISSION:  02/06/2003  DATE OF DISCHARGE:  02/10/2003                                 DISCHARGE SUMMARY   ADMISSION DIAGNOSES:  1. End-stage osteoarthritis, left knee.  2. Hypertension.  3. History of herniated disk of lumbar spine.   DISCHARGE DIAGNOSES:  1. Let total knee arthroplasty.  2. Hypokalemia.  3. Postoperative blood loss anemia.  4. History of hypertension.   HISTORY OF PRESENT ILLNESS:  The patient is a 77 year old white female with  a history of left knee arthroscopy in September 2001 who presents with  sudden onset of grossly worsening left knee pain.  The pain is described as  intermittent diffuse pain about the joint with no radiation, increases with  walking and range of motion, improves with Tylenol and pain medications.  She does have occasional swelling.  She is using a walker since September  2004.   ALLERGIES:  No known drug allergies.   CURRENT MEDICATIONS:  1. Hydrochlorothiazide 25 mg p.o. daily.  2. Multivitamins 1 tablet p.o. daily.  3. Glucosamine chondroitin.  4. Tylenol p.r.n.   SURGICAL PROCEDURE:  On February 06, 2003, the patient was taken to the OR by  Jonny Ruiz L. Rendall, M.D., assisted by Richardean Canal, P.A.-C.  Under general  anesthesia, the patient underwent a left total knee arthroplasty with an LCS  knee replacement.  The patient tolerated the procedure well.  There were no  complications.  She was transferred to the recovery room and then to the  orthopedic floor in good condition.   CONSULTATIONS:  The following routine consultations were requested.  Physical therapy, occupational therapy, rehab.   A medical consult was requested for the  patient's medical issues and related  hypokalemia.   HOSPITAL COURSE:  On February 06, 2003, the patient was admitted to [facility] under the care of John L. Rendall, M.D.  The patient was taken  to the OR where a left total knee arthroplasty was performed.  The patient  tolerated the procedure well.  The patient did have an intraoperative  nondisplaced fracture of the medial tibia which was repaired with a screw  and BSM grafting.  The patient tolerated the procedure well.  She was  transferred to the recovery room and then to the orthopedic floor in good  condition.   The patient then incurred a total of four days postoperative care on the  orthopedic floor during which the patient developed some significant  hypokalemia.  Initially on postop day #1, her potassium level was 3.6.  By  the next day, it had dropped to 2.7.  Initially  there was some slight  resistance to p.o. potassium at 20 mEq p.o. t.i.d., but it gradually  improved on its own to being 3.5 on the day of discharge.  A medical consult  was also requested for evaluation of hypokalemia and its slight resistance  to replacement with no other significant recommendations other than p.o.  replacement and holding hydrochlorothiazide.   The patient had no other significant medical events during her  hospitalization.  She tolerated physical therapy well.  Her vital signs  remained stable.  Her wound remained benign for any signs of infection.  Her  leg remained neural, motor,  vascularly intact, and it was felt that the  patient was medically and orthopedically ready for discharge.  So, she was  discharged to home on postop day #3 in good condition.   LABORATORY DATA:  CBC:  On October 14, WBC 5.6, hemoglobin 8.8, hematocrit  25.8, platelets 126.  The patient denied any complaints, and no  recommendations were made or blood transfusions but felt to be allowed to  correct itself with just the multivitamin supplement.    On October 15, routine chemistries showed sodium 133, up from a low of 132;  potassium 3.5, up from a low of 2.6; glucose 124, down from a high of 132  felt to be related to postoperative stress.  BUN 5, creatinine 0.6.  Routine  urinalysis on admission was normal.   DISCHARGE MEDICATIONS:  1. Colace 100 mg p.o. b.i.d.  2. Trinsicon 1 tablet p.o. t.i.d.  3. Laxative and enema of choice p.r.n.  4. Reglan 10 mg p.o. q.8h. p.r.n.  5. Percocet 1 or 2 tablets every 4 to 6 hours p.r.n.  6. Tylenol 650 mg p.o. q.4h. p.r.n.  7. Robaxin 500 mg p.o. q.6h. p.r.n.  8. Restoril 15 mg p.o. q.h.s. p.r.n.  9. Hydrochlorothiazide 12.5 mg on hold.  10.      Multivitamins 1 tablet p.o. daily.  11.      Arixtra 2.5 mg subcutaneously q.24h. for a total of 7 days.  12.      Cepacol lozenges at bedside.  13.      OxyContin CR 10 mg p.o. q.12h.  14.      Potassium chloride 20 mEq p.o. b.i.d.  15.      Altace 2.5 mg p.o. daily.   DISCHARGE INSTRUCTIONS:  1. The patient is to resume her multivitamins and glucosamine chondroitin     home medications.  2. The patient is to stop the hydrochlorothiazide and Tylenol.  3. The patient is to take Altace 2.5 mg tablet once a day for blood     pressure.  4. Trinsicon 1 tablet a day for 30 days.  5. Potassium chloride 1 tablet 20 mEq p.o. 3 times a day.  6. Percocet 5 mg 1 or 2 tablets every 4 to 6 hours for pain if needed.  7. OxyContin CR 10 mg 1 tablet every 12 hours for pain.  8. Arixtra 2.5 mg injection for a total of 4 more days.  9. Activity:  The patient is to be weightbearing as tolerated with the use     of a walker.  10.      Diet: No restrictions.  11.      Wound care:  The patient is to check wound daily for any signs of     infection.  If the patient has any questions or concerns, the patient is     to call Dr. [name] for advice.  SPECIAL INSTRUCTIONS:  CPM 0 to 9 degrees 6 to 8 hours a day, increased by 10 degrees daily.   FOLLOW UP:  The  patient should have a followup appointment with Dr. [name]  in 10 days.  The patient is to call for an appointment.  She is also to call  her primary care physician for a followup appointment in the next one to two  days for evaluation of her blood pressure, potassium and hemoglobin check.   CONDITION ON DISCHARGE:  The patient's condition on discharge to home is  listed as improved and good.      Jamelle Rushing, P.A.                      John L. [name], M.D.    RWK/MEDQ  D:  03/10/2003  T:  03/10/2003  Job:  [id number]   cc:   Hal T. Stoneking, M.D.  [address] [place], [place] [address]  Fax: [phone]

## 2010-09-27 ENCOUNTER — Other Ambulatory Visit: Payer: Self-pay | Admitting: Orthopedic Surgery

## 2010-09-27 ENCOUNTER — Ambulatory Visit (HOSPITAL_COMMUNITY)
Admission: RE | Admit: 2010-09-27 | Discharge: 2010-09-27 | Disposition: A | Discharge status: Home / Self Care | Payer: Medicare Other | Source: Ambulatory Visit | Attending: Orthopedic Surgery | Admitting: Orthopedic Surgery

## 2010-09-27 ENCOUNTER — Other Ambulatory Visit (HOSPITAL_COMMUNITY): Payer: Self-pay | Admitting: Orthopedic Surgery

## 2010-09-27 ENCOUNTER — Encounter (HOSPITAL_COMMUNITY): Payer: Medicare Other

## 2010-09-27 DIAGNOSIS — Z01811 Encounter for preprocedural respiratory examination: Secondary | ICD-10-CM

## 2010-09-27 DIAGNOSIS — Z01812 Encounter for preprocedural laboratory examination: Secondary | ICD-10-CM | POA: Insufficient documentation

## 2010-09-27 DIAGNOSIS — Z01818 Encounter for other preprocedural examination: Secondary | ICD-10-CM | POA: Insufficient documentation

## 2010-09-27 LAB — CBC
HCT: 40.2 % (ref 36.0–46.0)
Hemoglobin: 13.1 g/dL (ref 12.0–15.0)
MCH: 27 pg (ref 26.0–34.0)
MCHC: 32.6 g/dL (ref 30.0–36.0)
MCV: 82.7 fL (ref 78.0–100.0)
Platelets: 138 10*3/uL — ABNORMAL LOW (ref 150–400)
RBC: 4.86 MIL/uL (ref 3.87–5.11)
RDW: 14.6 % (ref 11.5–15.5)
WBC: 3.9 10*3/uL — ABNORMAL LOW (ref 4.0–10.5)

## 2010-09-27 LAB — COMPREHENSIVE METABOLIC PANEL
ALT: 15 U/L (ref 0–35)
AST: 22 U/L (ref 0–37)
Albumin: 4 g/dL (ref 3.5–5.2)
Alkaline Phosphatase: 100 U/L (ref 39–117)
BUN: 16 mg/dL (ref 6–23)
CO2: 27 mEq/L (ref 19–32)
Calcium: 9.9 mg/dL (ref 8.4–10.5)
Chloride: 100 mEq/L (ref 96–112)
Creatinine, Ser: 0.57 mg/dL (ref 0.4–1.2)
GFR calc Af Amer: >60 mL/min (ref >60)
GFR calc non Af Amer: >60 mL/min (ref >60)
Glucose, Bld: 98 mg/dL (ref 70–99)
Potassium: 4.6 mEq/L (ref 3.5–5.1)
Sodium: 136 mEq/L (ref 135–145)
Total Bilirubin: 0.4 mg/dL (ref 0.3–1.2)
Total Protein: 7.8 g/dL (ref 6.0–8.3)

## 2010-09-27 LAB — DIFFERENTIAL
Basophils Absolute: 0 10*3/uL (ref 0.0–0.1)
Basophils Relative: 0 % (ref 0–1)
Eosinophils Absolute: 0 10*3/uL (ref 0.0–0.7)
Eosinophils Relative: 1 % (ref 0–5)
Lymphocytes Relative: 37 % (ref 12–46)
Lymphs Abs: 1.4 10*3/uL (ref 0.7–4.0)
Monocytes Absolute: 0.4 10*3/uL (ref 0.1–1.0)
Monocytes Relative: 10 % (ref 3–12)
Neutro Abs: 2 10*3/uL (ref 1.7–7.7)
Neutrophils Relative %: 52 % (ref 43–77)

## 2010-09-27 LAB — ABO/RH: ABO/RH(D): AB POS

## 2010-09-27 LAB — URINALYSIS, ROUTINE W REFLEX MICROSCOPIC
Bilirubin Urine: NEGATIVE
Glucose, UA: NEGATIVE mg/dL
Hgb urine dipstick: NEGATIVE
Ketones, ur: NEGATIVE mg/dL
Nitrite: NEGATIVE
Protein, ur: NEGATIVE mg/dL
Specific Gravity, Urine: 1.023 (ref 1.005–1.030)
Urobilinogen, UA: 0.2 mg/dL (ref 0.0–1.0)
pH: 5 (ref 5.0–8.0)

## 2010-09-27 LAB — SURGICAL PCR SCREEN
MRSA, PCR: NEGATIVE
Staphylococcus aureus: NEGATIVE

## 2010-09-28 LAB — URINE CULTURE
Colony Count: NO GROWTH
Culture: NO GROWTH
Special Requests: NEGATIVE

## 2010-10-03 ENCOUNTER — Inpatient Hospital Stay (HOSPITAL_COMMUNITY)
Admission: RE | Admit: 2010-10-03 | Discharge: 2010-10-07 | DRG: 470 | Disposition: A | Discharge status: Skilled Nursing Facility | Payer: Medicare Other | Source: Ambulatory Visit | Attending: Orthopedic Surgery | Admitting: Orthopedic Surgery

## 2010-10-03 DIAGNOSIS — M1710 Unilateral primary osteoarthritis, unspecified knee: Principal | ICD-10-CM | POA: Diagnosis present

## 2010-10-03 DIAGNOSIS — M5137 Other intervertebral disc degeneration, lumbosacral region: Secondary | ICD-10-CM | POA: Diagnosis present

## 2010-10-03 DIAGNOSIS — D696 Thrombocytopenia, unspecified: Secondary | ICD-10-CM | POA: Diagnosis not present

## 2010-10-03 DIAGNOSIS — I10 Essential (primary) hypertension: Secondary | ICD-10-CM | POA: Diagnosis present

## 2010-10-03 DIAGNOSIS — M51379 Other intervertebral disc degeneration, lumbosacral region without mention of lumbar back pain or lower extremity pain: Secondary | ICD-10-CM | POA: Diagnosis present

## 2010-10-03 DIAGNOSIS — D62 Acute posthemorrhagic anemia: Secondary | ICD-10-CM | POA: Diagnosis not present

## 2010-10-03 DIAGNOSIS — Z96659 Presence of unspecified artificial knee joint: Secondary | ICD-10-CM

## 2010-10-03 DIAGNOSIS — E669 Obesity, unspecified: Secondary | ICD-10-CM | POA: Diagnosis present

## 2010-10-03 DIAGNOSIS — Z01810 Encounter for preprocedural cardiovascular examination: Secondary | ICD-10-CM

## 2010-10-03 DIAGNOSIS — K219 Gastro-esophageal reflux disease without esophagitis: Secondary | ICD-10-CM | POA: Diagnosis present

## 2010-10-03 DIAGNOSIS — K5900 Constipation, unspecified: Secondary | ICD-10-CM | POA: Diagnosis not present

## 2010-10-04 LAB — BASIC METABOLIC PANEL
BUN: 14 mg/dL (ref 6–23)
CO2: 27 mEq/L (ref 19–32)
Calcium: 8.3 mg/dL — ABNORMAL LOW (ref 8.4–10.5)
Chloride: 102 mEq/L (ref 96–112)
Creatinine, Ser: 0.47 mg/dL (ref 0.4–1.2)
GFR calc Af Amer: >60 mL/min (ref >60)

## 2010-10-04 LAB — CBC
Hemoglobin: 10.2 g/dL — ABNORMAL LOW (ref 12.0–15.0)
MCH: 27.3 pg (ref 26.0–34.0)
MCHC: 32.7 g/dL (ref 30.0–36.0)
MCV: 83.6 fL (ref 78.0–100.0)
Platelets: 118 10*3/uL — ABNORMAL LOW (ref 150–400)

## 2010-10-05 LAB — CBC
HCT: 25.5 % — ABNORMAL LOW (ref 36.0–46.0)
Hemoglobin: 8.4 g/dL — ABNORMAL LOW (ref 12.0–15.0)
MCH: 27.3 pg (ref 26.0–34.0)
RBC: 3.08 MIL/uL — ABNORMAL LOW (ref 3.87–5.11)

## 2010-10-05 LAB — BASIC METABOLIC PANEL
CO2: 26 mEq/L (ref 19–32)
Calcium: 8.3 mg/dL — ABNORMAL LOW (ref 8.4–10.5)
Glucose, Bld: 136 mg/dL — ABNORMAL HIGH (ref 70–99)
Sodium: 135 mEq/L (ref 135–145)

## 2010-10-06 LAB — CBC
Hemoglobin: 9 g/dL — ABNORMAL LOW (ref 12.0–15.0)
MCH: 28 pg (ref 26.0–34.0)
MCV: 82.9 fL (ref 78.0–100.0)
RBC: 3.21 MIL/uL — ABNORMAL LOW (ref 3.87–5.11)

## 2010-10-06 LAB — COMPREHENSIVE METABOLIC PANEL
CO2: 25 mEq/L (ref 19–32)
Calcium: 8.3 mg/dL — ABNORMAL LOW (ref 8.4–10.5)
Creatinine, Ser: 0.62 mg/dL (ref 0.4–1.2)
GFR calc Af Amer: >60 mL/min (ref >60)
GFR calc non Af Amer: >60 mL/min (ref >60)
Glucose, Bld: 102 mg/dL — ABNORMAL HIGH (ref 70–99)

## 2010-10-06 LAB — CROSSMATCH
ABO/RH(D): AB POS
Antibody Screen: NEGATIVE

## 2010-10-09 NOTE — Op Note (Signed)
NAMEDORETHIA, JEANMARIE NO.:  [id number]  MEDICAL RECORD NO.:  [id number]  LOCATION:  1611                         FACILITY:  [facility]  PHYSICIAN:  Kadisha Goodine L. Rendall, M.D.  DATE OF BIRTH:  Jul 18, 1930  DATE OF PROCEDURE:  10/03/2010 DATE OF DISCHARGE:                              OPERATIVE REPORT   PREOPERATIVE DIAGNOSIS:  Osteoarthritis, right knee.  SURGICAL PROCEDURE:  Right LCS total knee arthroplasty with computer navigation assistance.  POSTOPERATIVE DIAGNOSIS:  Osteoarthritis, right knee.  SURGEON:  Yaxiel Minnie L. Rendall, MD  ASSISTANT:  Legrand Pitts. Duffy, P.A.C. - present and participating in entire procedure.  ANESTHESIA:  General with femoral nerve block.  PATHOLOGY:  Tricompartmental arthritis with pain steadily worsening despite occasional cortisone injection and antiinflammatories.  PROCEDURE:  Under general anesthesia with femoral nerve block, the right leg was prepared with DuraPrep and draped as a sterile field.  A sterile tourniquet was placed in the proximal thigh.  The leg was wrapped out with the Esmarch and the tourniquet was used at 350 mm.  Midline incision was made.  The patella was everted.  The femur was sized at a standard plus.  A debridement was done in preparation for computer mapping.  Once this was completed, Schanz pins were placed in the superior medial tibia through extra punctures and the distal femur.  The arrays were set up.  The mapping was then done finding the femoral head. The malleoli and mapping of the proximal tibia and distal femur to within 1 mm of reproducible accuracy.  Once this was completed, the proximal tibia was osteotomized.  It should be noted that at this point in the case it became obvious that the bone was so osteoporotic that it literally was crumbling and any pressure applied with an osteotome to elevate the wafer excised would totally dent the tibia.  After this bone was taken out in pieces, decision was  made to go with the deeper MBT mobile bearing revision tray to a give extra support.  The tensioner was then used.  The knee ligaments were balanced within 1 degree of anatomic alignment.  The flexion gap was then measured using the first femoral guide with the computer.  The anterior and posterior flares of the distal femur were resected.  Using the second guide, the distal femoral cut was made.  Lamina spreader was then inserted and remnants of menisci and cruciates and spurs off the back of the condyle were resected.  Once this was completed, the tibia was sized as a #3.  The trial of an MBT tray 12.5 bearing and standard plus femur were then all trialed as trial components.  The knee had a tendency to go into slight hyperextension and upsized the bearing to a 15, this kept the knee in anatomic alignment within 1.5 degrees of correct longitudinal alignment and the knee was not tending to hyperextend at this point.  Permanent components were then obtained.  Bony surfaces were prepared with pulse irrigation. All components were then cemented in place and we had the MBT revision tray size 4 cemented.  We had the SmartSet HV bone cement, the LCS complete patella and tibial insert rotating platform.  The primary  femoral cemented standard plus.  With all components in place, cement hardened.  The tourniquet was let down at an hour and 5 minutes.  Once the cement hardened, excess was removed.  Multiple small vessels were cauterized.  The knee was then closed in layers over a medium Hemovac drain using #1 Tycron, #1 Vicryl, 2-0 Vicryl and skin clips.  Operative time, approximately an hour and 25 minutes.  The patient tolerated the procedure well and returned to recovery in good condition.  Blood loss estimated 250 cc.     Tawan Degroote L. [name], M.D.     Renato Gails  D:  10/03/2010  T:  10/03/2010  Job:  [id number]  Electronically Signed by Erasmo Leventhal M.D. on 10/09/2010 11:22:29 AM

## 2010-10-09 NOTE — Discharge Summary (Signed)
[name], WADLEY NO.:  [id number]  MEDICAL RECORD NO.:  [id number]  LOCATION:  1611                         FACILITY:  [facility]  PHYSICIAN:  Green Quincy L. Rendall, M.D.  DATE OF BIRTH:  1930-06-01  DATE OF ADMISSION:  10/03/2010 DATE OF DISCHARGE:  10/07/2010                              DISCHARGE SUMMARY   ADMISSION DIAGNOSES: 1. End-stage osteoarthritis of right knee status post left total knee     arthroplasty. 2. Hypertension. 3. Heart murmur. 4. Obesity. 5. History of degenerative disk disease, lumbar spine.  DISCHARGE DIAGNOSES: 1. End-stage osteoarthritis of right knee status post right total knee     arthroplasty. 2. Acute blood loss anemia secondary to surgery. 3. Constipation. 4. History of left total knee arthroplasty. 5. Hypertension. 6. Heart murmur. 7. Obesity. 8. History of degenerative disk disease, lumbar spine.  SURGICAL PROCEDURES:  On October 03, 2010, Ms. Stockhausen underwent a right total knee arthroplasty with computer navigation by Dr. Jonny Ruiz L. Rendall assisted by Arnoldo Morale PA-C.  She had a primary femoral component cemented size standard plus right placed with an LCS complete metal back patella cemented size standard plus.  An MBT revision tibial tray size 4 cemented with an LCS complete tibial insert rotating platform 15-mm thickness standard plus.  COMPLICATIONS:  None.  CONSULTANTS: 1. Physical therapy and case management consult on October 04, 2010. 2. Occupational therapy consult on October 05, 2010.  HISTORY OF PRESENT ILLNESS:  This 77 year old white female patient presented to Dr. [name] with history of a left total knee in October of 2004 and a 4- to 4-month history of gradual onset of progressive right knee pain without injury or surgery.  Right knee pain is a constant medium to sharp sensation diffuse about the knee with radiation to the the ankle.  It increases with prolonged sitting and decreases with rest and Aleve.  Pain  keeps her up at night.  Celebrex and Aleve provides minimal relief.  She has failed conservative treatment and because of that she is presenting for right knee replacement.  HOSPITAL COURSE:  Ms. Attridge tolerated her surgical procedure well without immediate postoperative complications.  She was transferred to the orthopedic floor.  On postop day #1, she was having some problems with nausea due to the PCA.  That was DC'ed.  She was afebrile, vitals were stable.  Hemoglobin 10.2, hematocrit 31.2.  Leg was neurovascularly intact.  She was weaned off the oxygen.  She does have a history of what sounds like early morning reflux symptoms, so I started her on some Prilosec to help with that.  On postop day #2, she remained afebrile but her hemoglobin had dropped to 8.4 with hematocrit of 25.5.  Potassium was a bit low.  She was supplemented with potassium and transfused 2 units of packed red blood cells.  It was noted she did have low platelet count.  Postop day #3 she was feeling much improved after the transfusion. Potassium improved to 3.7.  Platelets were stable after 2 units of packed red blood cells but still low.  She did have some tenderness in her calves but no change from normal.  She was continued on therapy and plans  were made for transfer to [facility] on the 11th.  On October 07, 2010, she is doing well, ambulating long distance in the fall.  Pain well controlled with p.o. meds.  No new labs today Tolerating CPM.  She is having some constipation that was treated with laxative.  It is felt she is ready for transfer to the skilled facility today and she will be transferred there later today.  DISCHARGE INSTRUCTIONS:  DIET:  She is to resume her regular prehospitalization diet.  MEDICATIONS:  Please see the home patient med discharge instruction sheet for complete documentation of medicines but we did start her on Celebrex, Robaxin, Percocet, Protonix and Xarelto.  She is to  be on Xarelto for 2 weeks after surgery.  ACTIVITY:  She can be out of bed weightbearing as tolerated with a walker on the right leg.  She is to have physical therapy per rehab protocols.  She is to have home CPM 0 to 90 degrees 6 to 8 hours a day for about 2 weeks after surgery.  Please see the white total joint discharge sheet for further activity instructions.  WOUND CARE:  Mepilex dressing can remain in place for another 2 to 3 days.  Once there is fair amount of drainage, it can be removed. Incision painted with Betadine once a day and dry gauze.  Please do not place any tape on the incision on the leg.  Her dressing should be kept in place with a TED stocking.  Please notify Dr. [name] of temperature greater than 101.5, chills, pain unrelieved by pain meds or foul- smelling drainage from the wound.  FOLLOW-UP:  She needs to follow up with Dr. [name] in our office on Thursday October 17, 2010, and needs to call [phone] for that appointment.  LABORATORY DATA:  Chest x-ray done on October 08, 2010, showed no acute findings.  Hemoglobin and hematocrit ranged from 10.2 and 31.2 on the 8th to 9 and 26.6 on the 10th.  White count remained within normal limits.  Platelets went from 118 on the 8th to 82 on the 10th.  Sodium went from 134 on the 8th to 138 on the 10th.  Potassium went from 3.7 on the 8th to 3.4 on the 9th to 3.7 on the 10th.  Glucose ranged from 139 on the 8th to 102 on the 10th.  Alk phos was high at 122 on the 10th with an AST of 63 and ALT of 40.  Her total protein was low at 5.3, albumin 2.5 and calcium was stable at 8.3 throughout her hospitalization.  All other laboratory studies were within normal limits.     Legrand Pitts Duffy, P.A.   ______________________________ Carlisle Beers. [name], M.D.    KED/MEDQ  D:  10/07/2010  T:  10/07/2010  Job:  [id number]  Electronically Signed by Otilio Jefferson. on 10/09/2010 10:35:36 AM Electronically Signed by Erasmo Leventhal M.D.  on 10/09/2010 11:22:31 AM

## 2011-02-10 LAB — BASIC METABOLIC PANEL
BUN: 15
CO2: 29
Calcium: 9.2
Chloride: 106
Creatinine, Ser: 0.62
GFR calc Af Amer: >60
GFR calc non Af Amer: >60
Glucose, Bld: 110 — ABNORMAL HIGH
Potassium: 4.3
Sodium: 138

## 2011-02-10 LAB — POCT HEMOGLOBIN-HEMACUE
Hemoglobin: 12.3
Operator id: 12362

## 2011-04-14 ENCOUNTER — Other Ambulatory Visit: Payer: Self-pay | Admitting: Orthopaedic Surgery

## 2011-04-14 DIAGNOSIS — M25512 Pain in left shoulder: Secondary | ICD-10-CM

## 2011-04-19 ENCOUNTER — Ambulatory Visit
Admission: RE | Admit: 2011-04-19 | Discharge: 2011-04-19 | Disposition: A | Discharge status: Home / Self Care | Payer: Medicare Other | Source: Ambulatory Visit | Attending: Orthopaedic Surgery | Admitting: Orthopaedic Surgery

## 2011-04-19 DIAGNOSIS — M25512 Pain in left shoulder: Secondary | ICD-10-CM

## 2011-05-13 DIAGNOSIS — S0180XA Unspecified open wound of other part of head, initial encounter: Secondary | ICD-10-CM | POA: Diagnosis not present

## 2011-05-21 DIAGNOSIS — M19019 Primary osteoarthritis, unspecified shoulder: Secondary | ICD-10-CM | POA: Diagnosis not present

## 2011-05-22 ENCOUNTER — Encounter (HOSPITAL_COMMUNITY): Payer: Self-pay | Admitting: Pharmacy Technician

## 2011-05-22 ENCOUNTER — Encounter (HOSPITAL_COMMUNITY)
Admission: RE | Admit: 2011-05-22 | Discharge: 2011-05-22 | Disposition: A | Discharge status: Home / Self Care | Payer: Medicare Other | Source: Ambulatory Visit | Attending: Orthopaedic Surgery | Admitting: Orthopaedic Surgery

## 2011-05-22 ENCOUNTER — Encounter (HOSPITAL_COMMUNITY): Payer: Self-pay

## 2011-05-22 HISTORY — DX: Nonrheumatic aortic (valve) stenosis: I35.0

## 2011-05-22 HISTORY — DX: Essential (primary) hypertension: I10

## 2011-05-22 HISTORY — DX: Adverse effect of unspecified anesthetic, initial encounter: T41.45XA

## 2011-05-22 HISTORY — DX: Thrombocytopenia, unspecified: D69.6

## 2011-05-22 HISTORY — DX: Other specified disorders of bone density and structure, unspecified site: M85.80

## 2011-05-22 HISTORY — DX: Inflammatory liver disease, unspecified: K75.9

## 2011-05-22 HISTORY — DX: Personal history of urinary calculi: Z87.442

## 2011-05-22 HISTORY — DX: Venous insufficiency (chronic) (peripheral): I87.2

## 2011-05-22 HISTORY — DX: Other complications of anesthesia, initial encounter: T88.59XA

## 2011-05-22 HISTORY — DX: Unspecified osteoarthritis, unspecified site: M19.90

## 2011-05-22 LAB — COMPREHENSIVE METABOLIC PANEL
CO2: 26 mEq/L (ref 19–32)
Calcium: 10 mg/dL (ref 8.4–10.5)
Creatinine, Ser: 0.73 mg/dL (ref 0.50–1.10)
GFR calc Af Amer: >90 mL/min (ref >90)
GFR calc non Af Amer: 80 mL/min — ABNORMAL LOW (ref >90)
Glucose, Bld: 104 mg/dL — ABNORMAL HIGH (ref 70–99)

## 2011-05-22 LAB — CBC
Hemoglobin: 14.4 g/dL (ref 12.0–15.0)
MCH: 28.6 pg (ref 26.0–34.0)
MCV: 83.7 fL (ref 78.0–100.0)
RBC: 5.04 MIL/uL (ref 3.87–5.11)

## 2011-05-22 LAB — URINALYSIS, ROUTINE W REFLEX MICROSCOPIC
Bilirubin Urine: NEGATIVE
Hgb urine dipstick: NEGATIVE
Protein, ur: NEGATIVE mg/dL
Specific Gravity, Urine: 1.02 (ref 1.005–1.030)
Urobilinogen, UA: 0.2 mg/dL (ref 0.0–1.0)

## 2011-05-22 LAB — DIFFERENTIAL
Basophils Absolute: 0 10*3/uL (ref 0.0–0.1)
Lymphocytes Relative: 37 % (ref 12–46)
Neutro Abs: 2.1 10*3/uL (ref 1.7–7.7)

## 2011-05-22 LAB — TYPE AND SCREEN
ABO/RH(D): AB POS
Antibody Screen: NEGATIVE

## 2011-05-22 LAB — URINE MICROSCOPIC-ADD ON

## 2011-05-22 LAB — PROTIME-INR: Prothrombin Time: 14.3 seconds (ref 11.6–15.2)

## 2011-05-22 NOTE — Pre-Procedure Instructions (Signed)
20 [name]  05/22/2011   Your procedure is scheduled on:  Tues, Feb 5  Report to [facility] at 0900 AM.  Call this number if you have problems the morning of surgery: [phone]   Remember:   Do not eat food:After Midnight.  May have clear liquids: up to 4 Hours before arrival.  Clear liquids include soda, tea, black coffee, apple or grape juice, broth.  Take these medicines the morning of surgery with A SIP OF WATER: *Norvasc,(blood pressure pill)**   Do not wear jewelry, make-up or nail polish.  Do not wear lotions, powders, or perfumes. You may wear deodorant.  Do not shave 48 hours prior to surgery.  Do not bring valuables to the hospital.  Contacts, dentures or bridgework may not be worn into surgery.  Leave suitcase in the car. After surgery it may be brought to your room.  For patients admitted to the hospital, checkout time is 11:00 AM the day of discharge.   Patients discharged the day of surgery will not be allowed to drive home.  Name and phone number of your driver: *n/a**  Special Instructions: Incentive Spirometry - Practice and bring it with you on the day of surgery. and CHG Shower Use Special Wash: 1/2 bottle night before surgery and 1/2 bottle morning of surgery.   Please read over the following fact sheets that you were given: Pain Booklet, Coughing and Deep Breathing, Blood Transfusion Information, MRSA Information and Surgical Site Infection Prevention

## 2011-05-24 LAB — URINE CULTURE: Colony Count: 40000

## 2011-05-26 DIAGNOSIS — I359 Nonrheumatic aortic valve disorder, unspecified: Secondary | ICD-10-CM | POA: Diagnosis not present

## 2011-05-31 NOTE — H&P (Signed)
COMPLAINT:  Painful left shoulder.  HPI:  [name] is seen today for evaluation of her left shoulder.  She is a 78 year old white female who has been having pain and discomfort in the left shoulder for many years. She had undergone a subacromial decompression of the left shoulder on 11/08/01. She states at that time it had been helpful; however, she has now developed worsening pain in the left shoulder to the point where she had thought that she would be a candidate for a reconstructive procedure.  She has marked crepitus with range of motion and is also starting to lose her motion.  She has had a CTA hemiarthroplasty of the shoulder on the right with very good results.  She comes in today for reevaluation of her shoulder.  She has had an MRI scan revealing a complete supraspinatus tendon tear with retraction of 5-6 cm of the tendon with severe atrophy of the muscle.  Infraspinatus and subscapularis show tendinopathy, but are intact.  She does have a large osteophyte off the humeral head and the glenoid bone is remodeled.  She does have a very bulky acromioclavicular joint with degenerative changes.  There is extensive subacromial spurring with a type 2 acromion also.  She is seen today for evaluation.  CURRENT MEDICATIONS:  Norvasc 2.5 mg daily, HCTZ 12.5 mg daily, vitamin D 1,000 IU daily, Centrum Silver daily, and Vision Formula daily.  ALLERGIES:  None known.  PAST SURGICAL HISTORY:  Left total knee arthroplasty in 2012, right total knee arthroplasty in 2005, right CTA shoulder replacement in the past, and childbirth in 1956 and 1964.  ROS:  Fourteen point review of systems has been denied except for history of upper dentures and partial lower denture. She does have history of nocturia 1-2 times per evening.  She also has right ankle swelling at times.  Upon further questioning, she states she has been told she did have a heart murmur previously; however, she denies any problems with it.  FAMILY  HISTORY:  Noncontributory.  SOCIAL HISTORY:  She is a 78 year old white female who is married.  She denies the use of tobacco.  She does drink every Friday, that of one apple martini.  EXAM:  Examination today reveals a 78 year old white female who is well developed, well nourished, alert, pleasant, and cooperative.  She is in moderate distress secondary to left shoulder pain.    HT:  5'6"  WT:  220 pounds.  TEMP:  96.6 degrees   BP:  148/93   Heart rate:  98     R:  18  HEENT:   Head normocephalic.  Ears, nose, and throat benign.  Neck supple.  No bruits.     Chest:   Good expansion.  Lungs clear to auscultation. Heart:   Regular rate and rhythm.  Grade III systolic ejection murmur of the entire precordium.  No rubs or gallops could be identified.  Pulse 1+ bilateral and symmetric.  She does have trace pitting edema bilaterally. CNS: Oriented x 3.  Cranial nerves 2-12 grossly intact. Abdomen:   Obese, soft, non tender, no masses palpable.  Normal bowel sounds present.   Genitalia:    Not indicated. Rectal: Not indicated.  Breast: Not indicated. Musculoskeletal:  She has about 135 degrees of abduction and forward flexion passively.  Actively she has about 80 degrees of abduction and forward flexion.  With the arm at 80 degrees, she has 60 degrees of external rotation and 45 degrees of internal rotation passively.  She  still has fairly good strength in the left shoulder.  Biceps tendon is not particularly painful.  There is crepitus with range of motion of the left shoulder.  X-RAYS:  Radiographic studies of the left shoulder reveal significant acromioclavicular arthritis.  There is also a type 2-3 acromion.  She has marked narrowing of the humeral acromial space.  Degenerative changes are noted at the glenohumeral space.  Loose body is noted.  IMPRESSION:   1. Osteoarthritis, left shoulder, glenohumeral as well as acromioclavicular. 2. Complete supraspinatus rotator cuff tear with retraction  and atrophy. 3. Moderate aortic stenosis.  RECOMMENDATIONS:      1. At this time, we feel that she would be a candidate for left CTA hemiarthroplasty of the shoulder.  She may also need to consider acromioclavicular debridement.  Continued 2. I have obtained a note from Dr. Pete Glatter who felt that she was cleared medically.  He felt routine anticoagulation was not necessary and he states she has well-controlled hypertension.  On his examination heart sounds were regular rhythm and rate and no murmur noted.  I called him today and asked him if her heart murmur may have been something recent or did she have this in the past.  He states he has had a history of previous moderate aortic stenosis.   .  Therefore, we will await his evaluation and cardiology. State clearance from Cardiology for surgery. 3. Also in her past medical history in which she has not been forthcoming, she does have history of nephrolithiasis as well as hamartoma right upper lobe which was removed in 2004.  She does have history of hyperglycemia as well as osteopenia.  She has also had history of thrombocytopenia and leukopenia in September of 2011.  Her allergies are to lisinopril which gives her a cough and Micardis which gives her dizziness.  We will need to upgrade these.   4. If we do get clearance from cardiology, we will then proceed with left CTA hemiarthroplasty.  Procedure risks and benefits have been fully explained to the patient.  She is understanding.  We will plan on doing this if her echocardiogram is unchanged.   5. Face-to-face total time with the patient was 50 minutes.  There was 5 minutes of consultation with Dr. Laverle Hobby office.  Oris Drone Massie Mees, PA-C  05/31/2011  1:57 PM

## 2011-06-02 MED ORDER — CEFAZOLIN SODIUM-DEXTROSE 2-3 GM-% IV SOLR
2.0000 g | INTRAVENOUS | Status: AC
  Administered 2011-06-03: 2 g via INTRAVENOUS
  Filled 2011-06-02: qty 50

## 2011-06-03 ENCOUNTER — Encounter (HOSPITAL_COMMUNITY)
Admission: RE | Disposition: A | Discharge status: Home / Self Care | Payer: Self-pay | Source: Ambulatory Visit | Attending: Orthopaedic Surgery

## 2011-06-03 ENCOUNTER — Inpatient Hospital Stay (HOSPITAL_COMMUNITY)
Admission: RE | Admit: 2011-06-03 | Discharge: 2011-06-05 | DRG: 483 | Disposition: A | Discharge status: Home / Self Care | Payer: Medicare Other | Source: Ambulatory Visit | Attending: Orthopaedic Surgery | Admitting: Orthopaedic Surgery

## 2011-06-03 ENCOUNTER — Ambulatory Visit (HOSPITAL_COMMUNITY): Payer: Medicare Other | Admitting: Anesthesiology

## 2011-06-03 ENCOUNTER — Encounter (HOSPITAL_COMMUNITY): Payer: Self-pay

## 2011-06-03 ENCOUNTER — Encounter (HOSPITAL_COMMUNITY): Payer: Self-pay | Admitting: Anesthesiology

## 2011-06-03 DIAGNOSIS — Z96619 Presence of unspecified artificial shoulder joint: Secondary | ICD-10-CM

## 2011-06-03 DIAGNOSIS — I350 Nonrheumatic aortic (valve) stenosis: Secondary | ICD-10-CM | POA: Diagnosis present

## 2011-06-03 DIAGNOSIS — M19019 Primary osteoarthritis, unspecified shoulder: Principal | ICD-10-CM | POA: Diagnosis present

## 2011-06-03 DIAGNOSIS — Z96659 Presence of unspecified artificial knee joint: Secondary | ICD-10-CM

## 2011-06-03 DIAGNOSIS — D62 Acute posthemorrhagic anemia: Secondary | ICD-10-CM | POA: Diagnosis not present

## 2011-06-03 DIAGNOSIS — I359 Nonrheumatic aortic valve disorder, unspecified: Secondary | ICD-10-CM | POA: Diagnosis present

## 2011-06-03 DIAGNOSIS — Z902 Acquired absence of lung [part of]: Secondary | ICD-10-CM

## 2011-06-03 DIAGNOSIS — M25519 Pain in unspecified shoulder: Secondary | ICD-10-CM | POA: Diagnosis not present

## 2011-06-03 DIAGNOSIS — Z6835 Body mass index (BMI) 35.0-35.9, adult: Secondary | ICD-10-CM

## 2011-06-03 DIAGNOSIS — I10 Essential (primary) hypertension: Secondary | ICD-10-CM | POA: Diagnosis present

## 2011-06-03 DIAGNOSIS — Z90710 Acquired absence of both cervix and uterus: Secondary | ICD-10-CM

## 2011-06-03 DIAGNOSIS — IMO0001 Reserved for inherently not codable concepts without codable children: Secondary | ICD-10-CM | POA: Diagnosis present

## 2011-06-03 DIAGNOSIS — G8918 Other acute postprocedural pain: Secondary | ICD-10-CM | POA: Diagnosis not present

## 2011-06-03 DIAGNOSIS — E669 Obesity, unspecified: Secondary | ICD-10-CM | POA: Diagnosis not present

## 2011-06-03 DIAGNOSIS — Z8619 Personal history of other infectious and parasitic diseases: Secondary | ICD-10-CM

## 2011-06-03 DIAGNOSIS — S43429A Sprain of unspecified rotator cuff capsule, initial encounter: Secondary | ICD-10-CM | POA: Diagnosis not present

## 2011-06-03 HISTORY — PX: SHOULDER HEMI-ARTHROPLASTY: SHX5049

## 2011-06-03 LAB — CBC
Hemoglobin: 11.9 g/dL — ABNORMAL LOW (ref 12.0–15.0)
MCH: 28.1 pg (ref 26.0–34.0)
Platelets: 108 10*3/uL — ABNORMAL LOW (ref 150–400)
RBC: 4.23 MIL/uL (ref 3.87–5.11)
WBC: 3.8 10*3/uL — ABNORMAL LOW (ref 4.0–10.5)

## 2011-06-03 SURGERY — HEMIARTHROPLASTY, SHOULDER
Anesthesia: General | Site: Shoulder | Laterality: Left | Wound class: Clean

## 2011-06-03 MED ORDER — MIDAZOLAM HCL 2 MG/2ML IJ SOLN
INTRAMUSCULAR | Status: AC
  Filled 2011-06-03: qty 2

## 2011-06-03 MED ORDER — NEOSTIGMINE METHYLSULFATE 1 MG/ML IJ SOLN
INTRAMUSCULAR | Status: DC | PRN
  Administered 2011-06-03: 3 mg via INTRAVENOUS

## 2011-06-03 MED ORDER — PROPOFOL 10 MG/ML IV EMUL
INTRAVENOUS | Status: DC | PRN
  Administered 2011-06-03: 200 mg via INTRAVENOUS

## 2011-06-03 MED ORDER — METOCLOPRAMIDE HCL 5 MG/ML IJ SOLN
5.0000 mg | Freq: Three times a day (TID) | INTRAMUSCULAR | Status: DC | PRN
  Filled 2011-06-03: qty 2

## 2011-06-03 MED ORDER — BISACODYL 10 MG RE SUPP: 10.0000 mg | Freq: Every day | RECTAL | Status: DC | PRN

## 2011-06-03 MED ORDER — ACETAMINOPHEN 10 MG/ML IV SOLN: 1000.0000 mg | Freq: Once | INTRAVENOUS | Status: DC

## 2011-06-03 MED ORDER — CHLORHEXIDINE GLUCONATE 4 % EX LIQD: 60.0000 mL | Freq: Once | CUTANEOUS | Status: DC

## 2011-06-03 MED ORDER — CEFAZOLIN SODIUM-DEXTROSE 2-3 GM-% IV SOLR
2.0000 g | Freq: Four times a day (QID) | INTRAVENOUS | Status: AC
  Administered 2011-06-03 – 2011-06-04 (×3): 2 g via INTRAVENOUS
  Filled 2011-06-03 (×3): qty 50

## 2011-06-03 MED ORDER — FENTANYL CITRATE 0.05 MG/ML IJ SOLN
INTRAMUSCULAR | Status: AC
  Filled 2011-06-03: qty 2

## 2011-06-03 MED ORDER — DROPERIDOL 2.5 MG/ML IJ SOLN: 0.6250 mg | INTRAMUSCULAR | Status: DC | PRN

## 2011-06-03 MED ORDER — HYDROMORPHONE HCL PF 1 MG/ML IJ SOLN
0.5000 mg | INTRAMUSCULAR | Status: DC | PRN
  Administered 2011-06-04: 1 mg via INTRAVENOUS
  Filled 2011-06-03: qty 1

## 2011-06-03 MED ORDER — ONDANSETRON HCL 4 MG/2ML IJ SOLN
4.0000 mg | Freq: Four times a day (QID) | INTRAMUSCULAR | Status: DC | PRN
  Administered 2011-06-04 (×2): 4 mg via INTRAVENOUS
  Filled 2011-06-03 (×2): qty 2

## 2011-06-03 MED ORDER — CHLORHEXIDINE GLUCONATE 4 % EX LIQD: 60.0000 mL | Freq: Every day | CUTANEOUS | Status: DC

## 2011-06-03 MED ORDER — SODIUM CHLORIDE 0.9 % IV SOLN
INTRAVENOUS | Status: DC
  Administered 2011-06-03: 18:00:00 via INTRAVENOUS

## 2011-06-03 MED ORDER — ONDANSETRON HCL 4 MG/2ML IJ SOLN
INTRAMUSCULAR | Status: DC | PRN
  Administered 2011-06-03: 4 mg via INTRAVENOUS

## 2011-06-03 MED ORDER — ROPIVACAINE HCL 5 MG/ML IJ SOLN
INTRAMUSCULAR | Status: DC | PRN
  Administered 2011-06-03: 30 mL via EPIDURAL

## 2011-06-03 MED ORDER — HYDROMORPHONE HCL PF 1 MG/ML IJ SOLN: 0.2500 mg | INTRAMUSCULAR | Status: DC | PRN

## 2011-06-03 MED ORDER — FENTANYL CITRATE 0.05 MG/ML IJ SOLN
50.0000 ug | INTRAMUSCULAR | Status: AC | PRN
  Administered 2011-06-03 (×2): 50 ug via INTRAVENOUS

## 2011-06-03 MED ORDER — ROCURONIUM BROMIDE 100 MG/10ML IV SOLN
INTRAVENOUS | Status: DC | PRN
  Administered 2011-06-03: 50 mg via INTRAVENOUS

## 2011-06-03 MED ORDER — LACTATED RINGERS IV SOLN
INTRAVENOUS | Status: DC
  Administered 2011-06-03: 11:00:00 via INTRAVENOUS

## 2011-06-03 MED ORDER — GLYCOPYRROLATE 0.2 MG/ML IJ SOLN
INTRAMUSCULAR | Status: DC | PRN
  Administered 2011-06-03: .5 mg via INTRAVENOUS

## 2011-06-03 MED ORDER — METHOCARBAMOL 100 MG/ML IJ SOLN
500.0000 mg | Freq: Four times a day (QID) | INTRAVENOUS | Status: DC | PRN
  Filled 2011-06-03: qty 5

## 2011-06-03 MED ORDER — LACTATED RINGERS IV SOLN
INTRAVENOUS | Status: DC | PRN
  Administered 2011-06-03 (×2): via INTRAVENOUS

## 2011-06-03 MED ORDER — DOCUSATE SODIUM 100 MG PO CAPS
100.0000 mg | ORAL_CAPSULE | Freq: Two times a day (BID) | ORAL | Status: DC
  Administered 2011-06-03 – 2011-06-05 (×4): 100 mg via ORAL
  Filled 2011-06-03 (×4): qty 1

## 2011-06-03 MED ORDER — MIDAZOLAM HCL 2 MG/2ML IJ SOLN
1.0000 mg | INTRAMUSCULAR | Status: AC | PRN
  Administered 2011-06-03 (×2): 1 mg via INTRAVENOUS

## 2011-06-03 MED ORDER — PHENYLEPHRINE HCL 10 MG/ML IJ SOLN
10.0000 mg | INTRAVENOUS | Status: DC | PRN
  Administered 2011-06-03: 1 ug/min via INTRAVENOUS

## 2011-06-03 MED ORDER — METOCLOPRAMIDE HCL 10 MG PO TABS: 5.0000 mg | ORAL_TABLET | Freq: Three times a day (TID) | ORAL | Status: DC | PRN

## 2011-06-03 MED ORDER — ONDANSETRON HCL 4 MG PO TABS: 4.0000 mg | ORAL_TABLET | Freq: Four times a day (QID) | ORAL | Status: DC | PRN

## 2011-06-03 MED ORDER — KETOROLAC TROMETHAMINE 15 MG/ML IJ SOLN
15.0000 mg | Freq: Four times a day (QID) | INTRAMUSCULAR | Status: DC
  Administered 2011-06-03: 15 mg via INTRAVENOUS

## 2011-06-03 MED ORDER — SODIUM CHLORIDE 0.9 % IV SOLN: INTRAVENOUS | Status: DC

## 2011-06-03 MED ORDER — FENTANYL CITRATE 0.05 MG/ML IJ SOLN
INTRAMUSCULAR | Status: DC | PRN
  Administered 2011-06-03: 150 ug via INTRAVENOUS

## 2011-06-03 MED ORDER — ACETAMINOPHEN 10 MG/ML IV SOLN
1000.0000 mg | Freq: Four times a day (QID) | INTRAVENOUS | Status: AC
  Administered 2011-06-03 – 2011-06-04 (×4): 1000 mg via INTRAVENOUS
  Filled 2011-06-03 (×4): qty 100

## 2011-06-03 MED ORDER — AMLODIPINE BESYLATE 2.5 MG PO TABS
2.5000 mg | ORAL_TABLET | Freq: Every day | ORAL | Status: DC
  Administered 2011-06-03 – 2011-06-05 (×3): 2.5 mg via ORAL
  Filled 2011-06-03 (×3): qty 1

## 2011-06-03 MED ORDER — METHOCARBAMOL 500 MG PO TABS
500.0000 mg | ORAL_TABLET | Freq: Four times a day (QID) | ORAL | Status: DC | PRN
  Administered 2011-06-04 – 2011-06-05 (×4): 500 mg via ORAL
  Filled 2011-06-03 (×5): qty 1

## 2011-06-03 MED ORDER — SODIUM CHLORIDE 0.9 % IR SOLN
Status: DC | PRN
  Administered 2011-06-03: 1000 mL

## 2011-06-03 MED ORDER — OXYCODONE HCL 5 MG PO TABS
5.0000 mg | ORAL_TABLET | ORAL | Status: DC | PRN
  Administered 2011-06-04 – 2011-06-05 (×6): 10 mg via ORAL
  Filled 2011-06-03 (×3): qty 2
  Filled 2011-06-03: qty 1
  Filled 2011-06-03 (×4): qty 2

## 2011-06-03 SURGICAL SUPPLY — 49 items
BLADE SAW SGTL 83.5X18.5 (BLADE) ×2 IMPLANT
BOWL SMART MIX CTS (DISPOSABLE) IMPLANT
CLEANER TIP ELECTROSURG 2X2 (MISCELLANEOUS) ×2 IMPLANT
CLOTH BEACON ORANGE TIMEOUT ST (SAFETY) ×2 IMPLANT
COVER BACK TABLE 24X17X13 BIG (DRAPES) ×2 IMPLANT
COVER SURGICAL LIGHT HANDLE (MISCELLANEOUS) ×2 IMPLANT
DRAPE INCISE IOBAN 66X45 STRL (DRAPES) IMPLANT
DRAPE PROXIMA HALF (DRAPES) ×2 IMPLANT
DRSG ADAPTIC 3X8 NADH LF (GAUZE/BANDAGES/DRESSINGS) ×2 IMPLANT
DRSG MEPILEX BORDER 4X8 (GAUZE/BANDAGES/DRESSINGS) ×2 IMPLANT
DRSG PAD ABDOMINAL 8X10 ST (GAUZE/BANDAGES/DRESSINGS) ×4 IMPLANT
DURAPREP 26ML APPLICATOR (WOUND CARE) ×2 IMPLANT
ELECT CAUTERY BLADE 6.4 (BLADE) ×2 IMPLANT
ELECT NEEDLE TIP 2.8 STRL (NEEDLE) ×2 IMPLANT
ELECT REM PT RETURN 9FT ADLT (ELECTROSURGICAL) ×2
ELECTRODE REM PT RTRN 9FT ADLT (ELECTROSURGICAL) ×1 IMPLANT
FACESHIELD LNG OPTICON STERILE (SAFETY) ×4 IMPLANT
GLOVE BIOGEL PI IND STRL 8 (GLOVE) ×1 IMPLANT
GLOVE BIOGEL PI IND STRL 8.5 (GLOVE) ×1 IMPLANT
GLOVE BIOGEL PI INDICATOR 8 (GLOVE) ×1
GLOVE BIOGEL PI INDICATOR 8.5 (GLOVE) ×1
GLOVE ECLIPSE 8.0 STRL XLNG CF (GLOVE) ×2 IMPLANT
GLOVE SURG ORTHO 8.5 STRL (GLOVE) ×2 IMPLANT
GOWN PREVENTION PLUS XLARGE (GOWN DISPOSABLE) ×2 IMPLANT
GOWN STRL NON-REIN LRG LVL3 (GOWN DISPOSABLE) ×4 IMPLANT
HANDPIECE INTERPULSE COAX TIP (DISPOSABLE)
KIT BASIN OR (CUSTOM PROCEDURE TRAY) ×2 IMPLANT
KIT ROOM TURNOVER OR (KITS) ×2 IMPLANT
MANIFOLD NEPTUNE II (INSTRUMENTS) ×2 IMPLANT
NEEDLE 22X1 1/2 (OR ONLY) (NEEDLE) IMPLANT
NS IRRIG 1000ML POUR BTL (IV SOLUTION) ×2 IMPLANT
PACK SHOULDER (CUSTOM PROCEDURE TRAY) ×2 IMPLANT
PAD ARMBOARD 7.5X6 YLW CONV (MISCELLANEOUS) ×4 IMPLANT
SET HNDPC FAN SPRY TIP SCT (DISPOSABLE) IMPLANT
SLING ARM IMMOBILIZER LRG (SOFTGOODS) ×2 IMPLANT
SLING ARM IMMOBILIZER MED (SOFTGOODS) ×2 IMPLANT
SPONGE LAP 4X18 X RAY DECT (DISPOSABLE) ×8 IMPLANT
SPONGE SCRUB IODOPHOR (GAUZE/BANDAGES/DRESSINGS) ×2 IMPLANT
STAPLER VISISTAT 35W (STAPLE) ×2 IMPLANT
SUCTION FRAZIER TIP 10 FR DISP (SUCTIONS) ×2 IMPLANT
SUT ETHIBOND NAB CT1 #1 30IN (SUTURE) ×10 IMPLANT
SUT MNCRL AB 3-0 PS2 18 (SUTURE) ×2 IMPLANT
SUT VIC AB 0 CT1 27 (SUTURE) ×2
SUT VIC AB 0 CT1 27XBRD ANBCTR (SUTURE) ×2 IMPLANT
SUT VIC AB 2-0 FS1 27 (SUTURE) ×4 IMPLANT
SYR CONTROL 10ML LL (SYRINGE) IMPLANT
TOWER CARTRIDGE SMART MIX (DISPOSABLE) IMPLANT
TRAY FOLEY CATH 14FR (SET/KITS/TRAYS/PACK) IMPLANT
WATER STERILE IRR 1000ML POUR (IV SOLUTION) ×2 IMPLANT

## 2011-06-03 NOTE — Anesthesia Postprocedure Evaluation (Signed)
Anesthesia Post Note  Patient: [name]  Procedure(s) Performed:  SHOULDER HEMI-ARTHROPLASTY - Left Hemi Shoulder Hemi-Arthroplasty   Anesthesia type: general  Patient location: PACU  Post pain: Pain level controlled  Post assessment: Patient's Cardiovascular Status Stable  Last Vitals:  Filed Vitals:   06/03/11 1530  BP: 103/57  Pulse: 72  Temp:   Resp: 18    Post vital signs: Reviewed and stable  Level of consciousness: sedated  Complications: No apparent anesthesia complications

## 2011-06-03 NOTE — Brief Op Note (Addendum)
06/03/2011  2:09 PM  PATIENT:  Valentina Lucks  78 y.o. female  PRE-OPERATIVE DIAGNOSIS:  Rotator cuff arthropathy left shoulder      POST-OPERATIVE DIAGNOSIS: same  PROCEDURE:  Procedure(s): SHOULDER HEMI-ARTHROPLASTY with CTA head SURGEON:  Surgeon(s): Valeria Batman, MD  PHYSICIAN ASSISTANT: Jacqualine Code, PAC  ANESTHESIA:   regional and general  EBL:  Total I/O In: 1000 [I.V.:1000] Out: 600 [Urine:400; Blood:200]  BLOOD ADMINISTERED:none  DRAINS: none   LOCAL MEDICATIONS USED:  MARCAINE 5CC  SPECIMEN:  No Specimen  DISPOSITION OF SPECIMEN:  N/A  COUNTS:  YES  TOURNIQUET:  * No tourniquets in log *  DICTATION: .dictatednumber [id number] PLAN OF CARE: Admit to inpatient   PATIENT DISPOSITION:  PACU - hemodynamically stable.   Delay start of Pharmacological VTE agent (>24hrs) due to surgical blood loss or risk of bleeding:  {YES/NO/NOT APPLICABLE:20182

## 2011-06-03 NOTE — Preoperative (Signed)
Beta Blockers   Reason not to administer Beta Blockers:Not Applicable 

## 2011-06-03 NOTE — Transfer of Care (Signed)
Immediate Anesthesia Transfer of Care Note  Patient: [name]  Procedure(s) Performed:  SHOULDER HEMI-ARTHROPLASTY - Left Hemi Shoulder Hemi-Arthroplasty   Patient Location: PACU  Anesthesia Type: General  Level of Consciousness: awake, alert  and oriented  Airway & Oxygen Therapy: Patient Spontanous Breathing and Patient connected to nasal cannula oxygen  Post-op Assessment: Report given to PACU RN and Post -op Vital signs reviewed and stable  Post vital signs: Reviewed and stable  Complications: No apparent anesthesia complications

## 2011-06-03 NOTE — Anesthesia Preprocedure Evaluation (Signed)
Anesthesia Evaluation  Patient identified by MRN, date of birth, ID band Patient awake    Reviewed: Allergy & Precautions, H&P , NPO status , Patient's Chart, lab work & pertinent test results, reviewed documented beta blocker date and time   Airway Mallampati: II TM Distance: >3 FB Neck ROM: Full    Dental  (+) Edentulous Upper and Teeth Intact   Pulmonary neg pulmonary ROS,  clear to auscultation  Pulmonary exam normal       Cardiovascular hypertension, Pt. on medications + Valvular Problems/Murmurs AS Regular + Systolic murmurs    Neuro/Psych Negative Neurological ROS  Negative Psych ROS   GI/Hepatic negative GI ROS, Neg liver ROS,   Endo/Other  Morbid obesity  Renal/GU negative Renal ROS     Musculoskeletal   Abdominal   Peds  Hematology   Anesthesia Other Findings   Reproductive/Obstetrics                           Anesthesia Physical Anesthesia Plan  ASA: III  Anesthesia Plan: General   Post-op Pain Management:    Induction: Intravenous  Airway Management Planned: Oral ETT  Additional Equipment:   Intra-op Plan:   Post-operative Plan: Extubation in OR  Informed Consent: I have reviewed the patients History and Physical, chart, labs and discussed the procedure including the risks, benefits and alternatives for the proposed anesthesia with the patient or authorized representative who has indicated his/her understanding and acceptance.   Dental advisory given  Plan Discussed with: CRNA, Anesthesiologist and Surgeon  Anesthesia Plan Comments:         Anesthesia Quick Evaluation

## 2011-06-03 NOTE — Anesthesia Procedure Notes (Addendum)
Anesthesia Regional Block:  Interscalene brachial plexus block  Pre-Anesthetic Checklist: ,, timeout performed, Correct Patient, Correct Site, Correct Laterality, Correct Procedure,, site marked, risks and benefits discussed, Surgical consent,  Pre-op evaluation,  At surgeon's request and post-op pain management  Laterality: Right  Prep: chloraprep       Needles:  Injection technique: Single-shot  Needle Type: Echogenic Stimulator Needle     Needle Length: 5cm 5 cm Needle Gauge: 22 and 22 G    Additional Needles:  Procedures: ultrasound guided and nerve stimulator Interscalene brachial plexus block  Nerve Stimulator or Paresthesia:  Response: bicep contraction, 0.45 mA,   Additional Responses:   Narrative:  Start time: 06/03/2011 10:47 AM End time: 06/03/2011 10:58 AM Injection made incrementally with aspirations every 5 mL.  Performed by: Personally  Anesthesiologist: J. Adonis Huguenin, MD  Additional Notes: Functioning IV was confirmed and monitors applied.  A 50mm 22ga echogenic arrow stimulator was used. Sterile prep and drape,hand hygiene and sterile gloves were used.Ultrasound guidance: relevent anatomy identified, needle position confirmed, local anesthetic spread visualized around nerve(s)., vascular puncture avoided.  Image printed for medical record.  Negative aspiration and negative test dose prior to incremental administration of local anesthetic. The patient tolerated the procedure well.  Interscalene brachial plexus block Procedure Name: Intubation Date/Time: 06/03/2011 12:06 PM Performed by: Caryn Bee Pre-anesthesia Checklist: Patient identified, Emergency Drugs available, Suction available, Patient being monitored and Timeout performed Patient Re-evaluated:Patient Re-evaluated prior to inductionOxygen Delivery Method: Circle System Utilized Preoxygenation: Pre-oxygenation with 100% oxygen Intubation Type: IV induction Ventilation: Mask ventilation without  difficulty Laryngoscope Size: Mac and 3 Grade View: Grade I Tube type: Oral Tube size: 7.5 mm Number of attempts: 1 Airway Equipment and Method: stylet Placement Confirmation: ETT inserted through vocal cords under direct vision and positive ETCO2 Secured at: 21 cm Tube secured with: Tape Dental Injury: Teeth and Oropharynx as per pre-operative assessment

## 2011-06-03 NOTE — H&P (Signed)
There has been no change in health status since  the current H&P.I have examined the patient and discussed the surgery. No contraindications to the planned procedure exist.    There has been no change in health status since  the current H&P.I have examined the patient and discussed the surgery. No contraindications to the planned procedure exist.

## 2011-06-04 ENCOUNTER — Encounter (HOSPITAL_COMMUNITY): Payer: Self-pay | Admitting: Orthopaedic Surgery

## 2011-06-04 DIAGNOSIS — IMO0001 Reserved for inherently not codable concepts without codable children: Secondary | ICD-10-CM | POA: Diagnosis present

## 2011-06-04 DIAGNOSIS — M19019 Primary osteoarthritis, unspecified shoulder: Secondary | ICD-10-CM | POA: Diagnosis present

## 2011-06-04 DIAGNOSIS — I10 Essential (primary) hypertension: Secondary | ICD-10-CM | POA: Diagnosis present

## 2011-06-04 DIAGNOSIS — I350 Nonrheumatic aortic (valve) stenosis: Secondary | ICD-10-CM | POA: Diagnosis present

## 2011-06-04 LAB — BASIC METABOLIC PANEL
CO2: 24 mEq/L (ref 19–32)
Calcium: 8.7 mg/dL (ref 8.4–10.5)
Chloride: 105 mEq/L (ref 96–112)
Glucose, Bld: 113 mg/dL — ABNORMAL HIGH (ref 70–99)
Potassium: 4 mEq/L (ref 3.5–5.1)
Sodium: 137 mEq/L (ref 135–145)

## 2011-06-04 MED ORDER — ZOLPIDEM TARTRATE 5 MG PO TABS
5.0000 mg | ORAL_TABLET | Freq: Every evening | ORAL | Status: DC | PRN
  Administered 2011-06-04: 5 mg via ORAL
  Filled 2011-06-04: qty 1

## 2011-06-04 MED ORDER — OXYCODONE HCL 5 MG PO TABS: 5.0000 mg | ORAL_TABLET | ORAL | Status: AC | PRN

## 2011-06-04 NOTE — Progress Notes (Signed)
OT Cancellation Note  Treatment cancelled today due to medical issues with patient which prohibited therapy. PT with c/o n/v. Will attempt again later today. [facility] Leanndra Pember, OTR/L  [phone] 06/04/2011 06/04/2011, 12:21 PM

## 2011-06-04 NOTE — Progress Notes (Signed)
Occupational Therapy Evaluation Patient Details Name: [name] MRN: [id number] DOB: [birth year] Today's Date: 06/04/2011  Problem List: There is no problem list on file for this patient.   Past Medical History:  Past Medical History  Diagnosis Date  . Complication of anesthesia     trouble waking up  . Hypertension     on treatment  . Arthritis   . Hepatitis     as a child" Liver infection", yellow jauncice  . Venous insufficiency   . History of nephrolithiasis     as child  . Aortic stenosis   . Thrombocytopenia, unspecified   . Benign tumor of lung     removed  . Osteopenia    Past Surgical History:  Past Surgical History  Procedure Date  . Joint replacement [id number]    rt and lft  . Total shoulder arthroplasty 2005    right  . Abdominal hysterectomy   . Fracture surgery     rt wrist , left hand  . Vascular surgery 2005    varicose vein   . Total knee arthroplasty     bilateral  . Lung lobectomy 2004    rt upper lobe  . Shoulder hemi-arthroplasty 06/03/2011    Procedure: SHOULDER HEMI-ARTHROPLASTY;  Surgeon: Valeria Batman, MD;  Location: [facility] OR;  Service: Orthopedics;  Laterality: Left;  Left Hemi Shoulder Hemi-Arthroplasty     OT Assessment/Plan/Recommendation OT Assessment Clinical Impression Statement: 78 yo female s/p TSA. Spoke with PA on phone to clarify movement. Per PA, OK to do pendulums, FF L shoulder within pain tolerance and abduction within pain tolerance to @ 30. No ER or Ext allowed. Pt will benefit from skilled OT to educate pt on HEP and ADL retraining using 1 hand and to emphasize positioning and edema control.and to achieve below established goals. Pt plans to D/C home with husband and is set up to receive [facility] sevices. No DME needs. Pt given written HEP instructions regarding shoulder protocal. OT Recommendation/Assessment: Patient will need skilled OT in the acute care venue OT Problem List: Decreased strength;Decreased range of  motion;Decreased activity tolerance;Decreased coordination;Decreased knowledge of precautions;Pain;Impaired UE functional use;Increased edema OT Therapy Diagnosis : Generalized weakness OT Plan OT Frequency: Min 2X/week OT Recommendation Follow Up Recommendations: Home health OT Equipment Recommended: None recommended by OT Individuals Consulted Consulted and Agree with Results and Recommendations: Patient OT Goals Acute Rehab OT Goals OT Goal Formulation: With patient Time For Goal Achievement: 7 days ADL Goals Pt Will Perform Upper Body Bathing: with supervision;Standing at sink;Unsupported ADL Goal: Upper Body Bathing - Progress: Goal set today Pt Will Perform Upper Body Dressing: with supervision;with caregiver independent in assisting;Sit to stand from chair;with cueing (comment type and amount) ADL Goal: Upper Body Dressing - Progress: Goal set today Arm Goals Pt Will Tolerate PROM: Left upper extremity;1 set;10 reps;to maintain range of motion (self PROM) Arm Goal: PROM - Progress: Goal set today Additional Arm Goal #1: Pt will complete pendulum ex as demonstrated using written handout with supervision. Arm Goal: Additional Goal #1 - Progress: Goal set today  OT Evaluation Precautions/Restrictions  Precautions Precautions: Shoulder Type of Shoulder Precautions: pendulums OK. FF within pain tolerance - PROM. abduction to @30 . No shoulder ext or ER allowed. Sling withthe exception of B/D or when positioned in chair. Pillow behind shoulder in recliner/bed. Precaution Booklet Issued: Yes (comment) Required Braces or Orthoses: Yes (sling) Restrictions Weight Bearing Restrictions: Yes LUE Weight Bearing: Non weight bearing Prior Functioning Home Living Lives With:  Spouse Receives Help From: Family Type of Home: House Home Layout: One level Home Access: Stairs to enter Secretary/administrator of Steps: 2 Bathroom Shower/Tub: Health visitor: Handicapped  height Home Adaptive Equipment: Built-in shower seat Prior Function Level of Independence: Independent with basic ADLs;Independent with homemaking with ambulation;Independent with gait;Independent with transfers Able to Take Stairs?: Yes Driving: Yes Vocation: Retired ADL ADL Eating/Feeding: Simulated;Modified independent Where Assessed - Eating/Feeding: Chair Grooming: Simulated;Supervision/safety Where Assessed - Grooming: Sitting, chair Upper Body Bathing: Simulated;Minimal assistance Where Assessed - Upper Body Bathing: Sitting, chair Lower Body Bathing: Simulated;Minimal assistance Where Assessed - Lower Body Bathing: Sit to stand from chair;Unsupported Upper Body Dressing: Simulated;Moderate assistance Where Assessed - Upper Body Dressing: Sitting, chair;Supported Lower Body Dressing: Simulated;Minimal assistance Where Assessed - Lower Body Dressing: Sit to stand from chair Toilet Transfer: Performed;Supervision/safety Toilet Transfer Method: Proofreader: Comfort height toilet Toileting - Clothing Manipulation: Performed;Independent Where Assessed - Toileting Clothing Manipulation: Standing Toileting - Hygiene: Performed;Independent Where Assessed - Toileting Hygiene: Standing Tub/Shower Transfer: Not assessed Ambulation Related to ADLs: mod I ADL Comments: Requires assistance at this time for ADL. Will benefit from another OT session in acute setting. Vision/Perception  Vision - History Baseline Vision: No visual deficits Cognition Cognition Arousal/Alertness: Awake/alert Overall Cognitive Status: Appears within functional limits for tasks assessed Orientation Level: Oriented X4 Sensation/Coordination Sensation Light Touch: Appears Intact Proprioception: Appears Intact Coordination Gross Motor Movements are Fluid and Coordinated: No Fine Motor Movements are Fluid and Coordinated: Yes Coordination and Movement Description: restricted due to  surgery and sling Extremity Assessment RUE Assessment RUE Assessment: Within Functional Limits LUE Assessment LUE Assessment: Exceptions to [facility] Mobility  Bed Mobility Bed Mobility: Yes (min A) Transfers Transfers: Yes (Mod I) Exercises Shoulder Exercises Pendulum Exercise: PROM;AAROM;5 reps;10 reps;Standing Shoulder Flexion: PROM;5 reps;Supine (PROM within pain tolerance) Shoulder Extension:  (NONE) Shoulder External Rotation:  (NONE ALLOWED) Elbow Flexion: AAROM;AROM;10 reps;Seated Elbow Extension: AAROM;AROM;10 reps;Seated Wrist Flexion: AROM;AAROM;10 reps;Seated Digit Composite Flexion: AROM;AAROM;Seated;10 reps Composite Extension: AROM;AAROM;10 reps;Seated Neck Flexion: AROM;Seated Neck Extension: AROM;10 reps Neck Lateral Flexion - Right: AROM;10 reps;Seated Neck Lateral Flexion - Left: AROM;10 reps;Seated Gentle pendulums in standing End of Session OT - End of Session Equipment Utilized During Treatment: Gait belt Activity Tolerance: Patient tolerated treatment well Patient left: in chair;with call bell in reach Nurse Communication: Mobility status for transfers General Behavior During Session: [facility] for tasks performed Cognition: [facility] for tasks performed   [facility] 06/04/2011, 6:19 PM  [facility], OTR/L  [phone] 06/04/2011

## 2011-06-04 NOTE — Op Note (Signed)
NAMEKELSEIGH, DIVER NO.:  [id number]  MEDICAL RECORD NO.:  [id number]  LOCATION:  5035                         FACILITY:  [facility]  PHYSICIAN:  Claude Manges. Pama Roskos, M.D.DATE OF BIRTH:  12/02/1930  DATE OF PROCEDURE: DATE OF DISCHARGE:                              OPERATIVE REPORT   PREOPERATIVE DIAGNOSIS:  Rotator cuff arthropathy, left shoulder.  POSTOPERATIVE DIAGNOSIS:  Rotator cuff arthropathy, left shoulder.  PROCEDURE:  Hemiarthroplasty, left shoulder with CTA head.  SURGEON:  Claude Manges. Cleophas Dunker, MD  ASSISTANT:  Oris Drone. Petrarca, PA-C  ANESTHESIA:  General with supplemental interscalene nerve block.  COMPLICATIONS:  None.  COMPONENTS:  DePuy Global Advantage size 12 standard stem, a Global Advantage CTA head 52-mm outer diameter with 18-mm neck length. Components were press-fit.  PROCEDURE:  Ms. Quizon was met in the holding area, identified the left shoulder as the appropriate operative site.  She was then transported to room #1 and placed under general anesthesia.  She did receive a preoperative interscalene nerve block.  The patient was then placed in the slightly sitting position with the left arm free.  The left arm was then prepped from the base of the neck circumferentially to about the wrist with Betadine scrub and then DuraPrep, sterile draping was performed.  Marking pen was used to outline a deltopectoral groove incision.  Using a 10 blade knife, the incision was made from about the coracoid distally about 4 inches, via sharp dissection carried down to subcutaneous tissue.  The patient was large.  There was abundant adipose tissue.  Bleeders were Bovie coagulated.  Self-retaining retractors were inserted.  With further blunt dissection, the axillary vein was identified and carefully retracted laterally with the deltoid muscle. The deltopectoral groove was then established manually and self- retaining retractors were placed more deeply.   The clavipectoral fascia was identified.  It appeared to be adherent to the conjoint tendon. This was manually freed.  I tracked and felt like that I could palpate the axillary nerve as well as the musculocutaneous nerve.  With gentle retraction, the conjoint tendon was retracted medially.  With the arm externally rotated, the subscapularis tendon was identified near its attachment to the greater tuberosity, it was longitudinally incised. The biceps tendon was barely visualized, appeared to be intact, and was left alone.  The joint was entered.  There was abundant clear yellow joint effusion, there was a moderate amount of synovitis that was resected.  The capsule was somewhat adherent to the subscap and this was debrided.  The patient had had a prior MRI scan revealing chronic tear of the supraspinatus with about 6 cm retraction, the muscle was atrophic.  Because of her arthritis and related to the fact that she had a rotator cuff deficiency, we elected to proceed with a CTA head hemiarthroplasty. The head was then easily exposed into the wound.  There were large osteophytes circumferentially.  These were removed superiorly, medially and laterally, and subsequently inferiorly.  A drill hole was then made in the center of the head superiorly and then hand reaming was performed to 12 mm, at which point we had good endosteal purchase.  The external cutting guide was then applied to  obtain the appropriate angle of osteotomy of the humeral head.  The oscillating saw was then applied to the cutting jig, and the head was osteotomized, it measured 52 mm outer diameter, it also correlated to a #18 neck.  At that point, a retractor was then placed around the humeral head so that I could visualize the glenoid.  There was chronic tearing of the labrum.  This was debrided.  There was a large loose body superiorly that I also resected and any further redundant or thickened capsule was also  resected.  I could visualize the remainder of the humeral head and removed any further osteophytes.  Rasping was then performed sequentially to a #12 and it fit beautifully on the humeral head in the appropriate angle of the cut.  We then applied a cutting jig for the CTA head and using the oscillating saw I made a superior cut again checking the biceps tendon which appeared to be intact.  The trial 52-mm outer diameter CTA head with an 18-mm neck was then applied to the trial rasp.  The entire construct was reduced, thought we had the appropriate retroversion.  I could sublux the head 50% posteriorly and with external and internal rotation, the head was perfectly stable.  The trial components were then removed.  The joint was copiously irrigated with saline solution.  Drill holes were then made into the humeral neck to we attached the subscapularis.  A 0 Ethibond suture was placed through the drill holes.  The final humeral component was sensed impacted into the humeral shaft. I used a bone from the humeral head as a bone graft to stabilize the component.  It fit beautifully.  I then further irrigated the wound.  I did not see any loose bodies in the joint.  The CT head was then applied and impacted and was perfectly stable.  It really had a nice fit.  The entire construct was then reduced, thought we had a very stable construct again with appropriate laxity posteriorly and anteriorly.  The subscapularis was then reattached to the humeral neck with the 0 Ethibond suture.  I was able to visualize the torn supraspinatus and any frayed edges that might impinge were debrided.  The wound was again irrigated with saline solution.  The deltopectoral groove closed with a running 0 Vicryl, the subcu with 2-0 Vicryl and 3-0 Monocryl, skin closed with skin clips. Sterile bulky dressing was applied followed by a sling.  The patient tolerated the procedure without  complications.     Claude Manges. Cleophas Dunker, M.D.     PWW/MEDQ  D:  06/03/2011  T:  06/04/2011  Job:  [id number]

## 2011-06-04 NOTE — Progress Notes (Signed)
Patient ID: [name], female   DOB: [birth year], 78 y.o.   MRN: [id number] PATIENT ID:      [name]  MRN:     [id number] DOB/AGE:    12-18-1930 / 78 y.o.    PROGRESS NOTE Subjective:  negative for Chest Pain  negative for Shortness of Breath  positive for Nausea/Vomiting -better this am  negative for Calf Pain  negative for Bowel Movement   Tolerating Diet: liquids this am without nausea         Patient reports pain as moderate.    Objective: Vital signs in last 24 hours:  Patient Vitals for the past 24 hrs:  BP Temp Temp src Pulse Resp SpO2  06/04/11 0555 103/47 mmHg 98.3 F (36.8 C) - 77  16  95 %  06/04/11 0205 125/58 mmHg 98.2 F (36.8 C) - 73  16  97 %  06/03/11 2235 124/59 mmHg 97.9 F (36.6 C) Oral 88  18  100 %  06/03/11 1552 135/62 mmHg 97.6 F (36.4 C) - 67  16  99 %  06/03/11 1530 103/57 mmHg - - 72  18  99 %  06/03/11 1515 108/55 mmHg 97 F (36.1 C) - 73  16  99 %  06/03/11 1500 104/54 mmHg - - 78  17  98 %  06/03/11 1445 101/52 mmHg - - 81  17  99 %  06/03/11 1435 96/59 mmHg - - 80  24  94 %  06/03/11 1433 - 97.1 F (36.2 C) - - - -  06/03/11 1040 - - - 69  - 100 %  06/03/11 0909 164/86 mmHg 97.5 F (36.4 C) Oral 78  18  100 %      Intake/Output from previous day:   02/05 0701 - 02/06 0700 In: 2110 [P.O.:360; I.V.:1700] Out: 1225 [Urine:1025]   Intake/Output this shift:       Intake/Output      02/05 0701 - 02/06 0700 02/06 0701 - 02/07 0700   P.O. 360    I.V. 1700    IV Piggyback 50    Total Intake 2110    Urine 1025    Blood 200    Total Output 1225    Net +885            LABORATORY DATA:  Basename 06/03/11 1539  WBC 3.8*  HGB 11.9*  HCT 35.8*  PLT 108*    Basename 06/04/11 0550  NA 137  K 4.0  CL 105  CO2 24  BUN 20  CREATININE 0.63  GLUCOSE 113*  CALCIUM 8.7   Lab Results  Component Value Date   INR 1.09 05/22/2011   INR 1.12 09/27/2010    Examination:  General appearance: alert, moderate distress and  moderately obese  Wound Exam: clean, dry, intact   Drainage:  None: wound tissue dry  Motor Exam Opposition, Pinch, Wrist Dorsiflexion, EDC, FDP, EPL and Cross Finger Intact  Sensory Exam Radial, Ulnar and Median normal  Assessment:    1 Day Post-Op  Procedure(s) (LRB): SHOULDER HEMI-ARTHROPLASTY (Left)  ADDITIONAL DIAGNOSIS:  Active Problems:  * No active hospital problems. *   Acute Blood Loss Anemia   Plan: Occupational Therapy as ordered Weight Bearing as Tolerated (WBAT)  DVT Prophylaxis:  Aspirin  DISCHARGE PLAN: Home  DISCHARGE NEEDS: HHPT         [name] 06/04/2011, 8:06 AM

## 2011-06-04 NOTE — Discharge Summary (Signed)
PATIENT ID:      [name]  MRN:     [id number] DOB/AGE:    [birth year] / 78 y.o.     DISCHARGE SUMMARY  ADMISSION DATE:    06/03/2011 DISCHARGE DATE:   06/05/2011   ADMISSION DIAGNOSIS: OA Left Shoulder     DISCHARGE DIAGNOSIS:  OA Left Shoulder     ADDITIONAL DIAGNOSIS: Active Problems:  Osteoarthritis of shoulder  Hypertension  Aortic stenosis, moderate  Obesity, Class II, BMI 35-39.9, with comorbidity  Past Medical History  Diagnosis Date  . Complication of anesthesia     trouble waking up  . Hypertension     on treatment  . Arthritis   . Hepatitis     as a child" Liver infection", yellow jauncice  . Venous insufficiency   . History of nephrolithiasis     as child  . Aortic stenosis   . Thrombocytopenia, unspecified   . Benign tumor of lung     removed  . Osteopenia     PROCEDURE: Procedure(s): LEFT CTA  SHOULDER HEMI-ARTHROPLASTY on 06/03/2011  CONSULTS:  NONE   HISTORY: [name] is seen today for evaluation of her left shoulder. She is a 78 year old white female who has been having pain and discomfort in the left shoulder for many years. She had undergone a subacromial decompression of the left shoulder on 11/08/01. She states at that time it had been helpful; however, she has now developed worsening pain in the left shoulder to the point where she had thought that she would be a candidate for a reconstructive procedure. She has marked crepitus with range of motion and is also starting to lose her motion. She has had a CTA hemiarthroplasty of the shoulder on the right with very good results.   HOSPITAL COURSE:  [name] is a 77 y.o. admitted on 06/03/2011 and found to have a diagnosis of OA Left Shoulder .  After appropriate laboratory studies were obtained  they were taken to the operating room on 06/03/2011 and underwent Procedure(s): LEFT CTA SHOULDER HEMI-ARTHROPLASTY.   They were given perioperative antibiotics:  Anti-infectives     Start      Dose/Rate Route Frequency Ordered Stop   06/03/11 1800   ceFAZolin (ANCEF) IVPB 2 g/50 mL premix     Comments: Last dose given at 1215 in OR      2 g 100 mL/hr over 30 Minutes Intravenous Every 6 hours 06/03/11 1645 06/04/11 0537   06/02/11 1445   ceFAZolin (ANCEF) IVPB 2 g/50 mL premix        2 g 100 mL/hr over 30 Minutes Intravenous 60 min pre-op 06/02/11 1436 06/03/11 1215        . Blood products given:none  POD #1,  Had some nausea after eating breakfast.  Foley d/c'd.  IV saline locked.  OT as per protocol.  POD #2,  No major complaints  The remainder of the hospital course was dedicated to ambulation and OT.   The patient was discharged on 2 Days Post-Op in  Stable condition.   DIAGNOSTIC STUDIES: Recent vital signs:  Patient Vitals for the past 24 hrs:  BP Temp Temp src Pulse Resp SpO2  06/05/11 0539 123/51 mmHg 97.7 F (36.5 C) - 96  18  97 %  06/04/11 2100 120/62 mmHg 98 F (36.7 C) - 93  18  97 %  06/04/11 1300 110/60 mmHg 98.1 F (36.7 C) Oral 80  18  95 %       Recent  laboratory studies:  [facility] 06/03/11 1539  WBC 3.8*  HGB 11.9*  HCT 35.8*  PLT 108*    Basename 06/05/11 0650 06/04/11 0550  NA 135 137  K 3.9 4.0  CL 101 105  CO2 26 24  BUN 11 20  CREATININE 0.61 0.63  GLUCOSE 181* 113*  CALCIUM 8.9 8.7   Lab Results  Component Value Date   INR 1.09 05/22/2011   INR 1.12 09/27/2010     Recent Radiographic Studies :  No results found.  DISCHARGE INSTRUCTIONS: Discharge Orders    Future Orders Please Complete By Expires   Diet general      Call MD / Call 911      Comments:   If you experience chest pain or shortness of breath, CALL 911 and be transported to the hospital emergency room.  If you develope a fever above 101 F, pus (white drainage) or increased drainage or redness at the wound, or calf pain, call your surgeon's office.   Constipation Prevention      Comments:   Drink plenty of fluids.  Prune juice may be helpful.  You may use  a stool softener, such as Colace (over the counter) 100 mg twice a day.  Use MiraLax (over the counter) for constipation as needed.   Increase activity slowly as tolerated      Patient may shower      Comments:   You may shower without a dressing once there is no drainage.  Do not wash over the wound.  If drainage remains, cover wound with plastic wrap and then shower.   Driving restrictions      Comments:   No driving for 6 weeks   Lifting restrictions      Comments:   No lifting for 6 weeks   Change dressing      Comments:   Change dressing on Saturday, then change the dressing daily with sterile 4 x 4 inch gauze dressing and apply TED hose.  You may clean the incision with alcohol prior to redressing.   Discharge instructions      Comments:   Use sling for protection and comfort.  Perform exercises taught by the therapist.  Follow precautions taught by therapist.      DISCHARGE MEDICATIONS:   Medication List  As of 06/05/2011 11:21 AM   TAKE these medications         amLODipine 2.5 MG tablet   Commonly known as: NORVASC   Take 2.5 mg by mouth daily.      cholecalciferol 1000 UNITS tablet   Commonly known as: VITAMIN D   Take 1,000 Units by mouth daily.      ICAPS AREDS FORMULA PO   Take 1 capsule by mouth daily.      mulitivitamin with minerals Tabs   Take 1 tablet by mouth daily.      oxyCODONE 5 MG immediate release tablet   Commonly known as: Oxy IR/ROXICODONE   Take 1-2 tablets (5-10 mg total) by mouth every 4 (four) hours as needed for pain.            FOLLOW UP VISIT:   Follow-up Information    Follow up with [facility], Claude Manges, MD. (as scheduled preop)    Contact information:   [address] [place] [address] [phone]          DISPOSITION:  Home   CONDITION:  Stable   [name] 06/05/2011, 11:21 AM

## 2011-06-05 ENCOUNTER — Inpatient Hospital Stay (HOSPITAL_COMMUNITY): Payer: Medicare Other

## 2011-06-05 LAB — BASIC METABOLIC PANEL
Chloride: 101 mEq/L (ref 96–112)
GFR calc non Af Amer: 85 mL/min — ABNORMAL LOW (ref >90)
Glucose, Bld: 181 mg/dL — ABNORMAL HIGH (ref 70–99)
Potassium: 3.9 mEq/L (ref 3.5–5.1)
Sodium: 135 mEq/L (ref 135–145)

## 2011-06-05 MED ORDER — OXYCODONE HCL 5 MG PO TABS
5.0000 mg | ORAL_TABLET | Freq: Once | ORAL | Status: AC
  Administered 2011-06-05: 5 mg via ORAL

## 2011-06-05 NOTE — Progress Notes (Signed)
Occupational Therapy Treatment Patient Details Name: Danay Mckellar MRN: [id number] DOB: 19-Apr-1931 Today's Date: 06/05/2011  OT Assessment/Plan OT Assessment/Plan Comments on Treatment Session: Pt c/o great pain early this am but did very well with exercises during this session and states she is ready to go home. OT Plan: Discharge plan remains appropriate OT Frequency: Min 2X/week Follow Up Recommendations: Home health OT Equipment Recommended: None recommended by OT OT Goals Acute Rehab OT Goals OT Goal Formulation: With patient Time For Goal Achievement: 7 days ADL Goals Pt Will Perform Upper Body Bathing: Unsupported Pt Will Perform Upper Body Dressing: with min assist (only needed assist with sling.) ADL Goal: Upper Body Dressing - Progress: Progressing toward goals Arm Goals Pt Will Tolerate PROM: Left upper extremity;1 set;10 reps;to maintain range of motion Arm Goal: PROM - Progress: Met Additional Arm Goal #1: Pt will complete pendulum ex as demonstrated using written handout with supervision. Arm Goal: Additional Goal #1 - Progress: Met  OT Treatment Precautions/Restrictions  Precautions Precautions: Shoulder Type of Shoulder Precautions: pendulums OK. FF within pain tolerance - PROM. abduction to @30 . No shoulder ext or ER allowed. Sling withthe exception of B/D or when positioned in chair. Pillow behind shoulder in recliner/bed. Precaution Booklet Issued: Yes (comment) Required Braces or Orthoses: Yes Restrictions Weight Bearing Restrictions: Yes LUE Weight Bearing: Non weight bearing   ADL ADL Grooming: Performed;Teeth care;Supervision/safety Where Assessed - Grooming: Standing at sink Upper Body Dressing: Performed;Minimal assistance;Other (comment) (donned and doffed sling with min assist to donn only) Where Assessed - Upper Body Dressing: Sitting, chair Toilet Transfer: Performed;Supervision/safety Toilet Transfer Method: Network engineer: Comfort height toilet Toileting - Clothing Manipulation: Performed;Independent Where Assessed - Toileting Clothing Manipulation: Standing Toileting - Hygiene: Performed;Independent Where Assessed - Toileting Hygiene: Standing ADL Comments: Pt continues to need occasional assist for adls but states husband will assist her at home. Mobility  Transfers Transfers: Yes Sit to Stand: 7: Independent Stand to Sit: 7: Independent Exercises Shoulder Exercises Pendulum Exercise: PROM;AAROM;10 reps;Standing Shoulder Flexion: PROM;10 reps;Standing Elbow Flexion: AROM;10 reps;Standing Elbow Extension: AROM;10 reps;Standing Wrist Flexion: AROM;10 reps;Standing Wrist Extension: AROM;10 reps;Standing Digit Composite Flexion: AROM;10 reps;Seated Composite Extension: AROM;10 reps;Seated  End of Session OT - End of Session Activity Tolerance: Patient tolerated treatment well Patient left: in chair;with call bell in reach Nurse Communication: Mobility status for transfers General Behavior During Session: [facility] for tasks performed Cognition: [facility] for tasks performed  Hope Budds [phone] 06/05/2011, 10:43 AM

## 2011-06-05 NOTE — Progress Notes (Addendum)
Patient ID: [name], female   DOB: 1930-08-23, 78 y.o.   MRN: [id number] PATIENT ID:      [name]  MRN:     [id number] DOB/AGE:    October 29, 1930 / 78 y.o.    PROGRESS NOTE Subjective:  negative for Chest Pain  negative for Shortness of Breath  negative for Nausea/Vomiting   negative for Calf Pain  negative for Bowel Movement   Tolerating Diet: yes         Patient reports pain as 3 on 0-10 scale.    Having pain this morning.  Has not been taking pain meds because she needs to eat.    Objective: Vital signs in last 24 hours:  Patient Vitals for the past 24 hrs:  BP Temp Temp src Pulse Resp SpO2  06/05/11 0539 123/51 mmHg 97.7 F (36.5 C) - 96  18  97 %  06/04/11 2100 120/62 mmHg 98 F (36.7 C) - 93  18  97 %  06/04/11 1300 110/60 mmHg 98.1 F (36.7 C) Oral 80  18  95 %      Intake/Output from previous day:       Intake/Output this shift:       Intake/Output      02/06 0701 - 02/07 0700 02/07 0701 - 02/08 0700   P.O.     I.V.     IV Piggyback     Total Intake     Urine     Blood     Total Output     Net          Urine Occurrence 1 x       LABORATORY DATA:  Basename 06/03/11 1539  WBC 3.8*  HGB 11.9*  HCT 35.8*  PLT 108*    Basename 06/05/11 0650 06/04/11 0550  NA 135 137  K 3.9 4.0  CL 101 105  CO2 26 24  BUN 11 20  CREATININE 0.61 0.63  GLUCOSE 181* 113*  CALCIUM 8.9 8.7   Lab Results  Component Value Date   INR 1.09 05/22/2011   INR 1.12 09/27/2010    Examination:  General appearance: alert, cooperative and moderate distress Resp: clear to auscultation bilaterally Cardio: regular rate and rhythm GI: normal findings: bowel sounds normal  Wound Exam: clean, dry, intact   Drainage:  None: wound tissue dry  Motor Exam Opposition, Pinch, Wrist Dorsiflexion, EDC, FDP and EPL Intact  Sensory Exam Radial, Ulnar and Median normal  Vascular: Radial pulse intact  Assessment:    2 Days Post-Op  Procedure(s) (LRB): SHOULDER  HEMI-ARTHROPLASTY (Left)  ADDITIONAL DIAGNOSIS:  Active Problems:  Osteoarthritis of shoulder  Hypertension  Aortic stenosis, moderate  Obesity, Class II, BMI 35-39.9, with comorbidity  Pain worsened secondary to not taking pain meds    Plan: Occupational Therapy as ordered   DVT Prophylaxis:  Foot Pumps and TED hose  DISCHARGE PLAN: Home  DISCHARGE NEEDS: HHPT     Will obtain x-ray Left Shoulder this AM  Pending d/c    PETRARCA,BRIAN 06/05/2011, 9:29 AM    Patient's pain improved 100% ACCORDING TO NURSE AND SHE WANTS TO BE DISCHARGED HOME  Will D/C home  Oris Drone. Santiago Bumpers, PA-C 06/05/2011 11:24 AM  X-ray d/c'd   Oris Drone. Petrarca, PA-C 06/05/2011 11:24 AM

## 2011-06-06 DIAGNOSIS — R269 Unspecified abnormalities of gait and mobility: Secondary | ICD-10-CM | POA: Diagnosis not present

## 2011-06-06 DIAGNOSIS — S43429A Sprain of unspecified rotator cuff capsule, initial encounter: Secondary | ICD-10-CM | POA: Diagnosis not present

## 2011-06-06 DIAGNOSIS — Z471 Aftercare following joint replacement surgery: Secondary | ICD-10-CM | POA: Diagnosis not present

## 2011-06-06 DIAGNOSIS — Z96619 Presence of unspecified artificial shoulder joint: Secondary | ICD-10-CM | POA: Diagnosis not present

## 2011-06-06 DIAGNOSIS — Z5189 Encounter for other specified aftercare: Secondary | ICD-10-CM | POA: Diagnosis not present

## 2011-06-06 DIAGNOSIS — N200 Calculus of kidney: Secondary | ICD-10-CM | POA: Diagnosis not present

## 2011-06-06 LAB — POCT I-STAT 4, (NA,K, GLUC, HGB,HCT)
Glucose, Bld: 111 mg/dL — ABNORMAL HIGH (ref 70–99)
HCT: 35 % — ABNORMAL LOW (ref 36.0–46.0)
Potassium: 3.6 mEq/L (ref 3.5–5.1)

## 2011-06-09 DIAGNOSIS — R269 Unspecified abnormalities of gait and mobility: Secondary | ICD-10-CM | POA: Diagnosis not present

## 2011-06-09 DIAGNOSIS — N200 Calculus of kidney: Secondary | ICD-10-CM | POA: Diagnosis not present

## 2011-06-09 DIAGNOSIS — Z96619 Presence of unspecified artificial shoulder joint: Secondary | ICD-10-CM | POA: Diagnosis not present

## 2011-06-09 DIAGNOSIS — Z5189 Encounter for other specified aftercare: Secondary | ICD-10-CM | POA: Diagnosis not present

## 2011-06-09 DIAGNOSIS — Z471 Aftercare following joint replacement surgery: Secondary | ICD-10-CM | POA: Diagnosis not present

## 2011-06-11 DIAGNOSIS — Z5189 Encounter for other specified aftercare: Secondary | ICD-10-CM | POA: Diagnosis not present

## 2011-06-11 DIAGNOSIS — Z471 Aftercare following joint replacement surgery: Secondary | ICD-10-CM | POA: Diagnosis not present

## 2011-06-11 DIAGNOSIS — R269 Unspecified abnormalities of gait and mobility: Secondary | ICD-10-CM | POA: Diagnosis not present

## 2011-06-11 DIAGNOSIS — Z96619 Presence of unspecified artificial shoulder joint: Secondary | ICD-10-CM | POA: Diagnosis not present

## 2011-06-11 DIAGNOSIS — N200 Calculus of kidney: Secondary | ICD-10-CM | POA: Diagnosis not present

## 2011-06-13 DIAGNOSIS — Z96619 Presence of unspecified artificial shoulder joint: Secondary | ICD-10-CM | POA: Diagnosis not present

## 2011-06-13 DIAGNOSIS — Z471 Aftercare following joint replacement surgery: Secondary | ICD-10-CM | POA: Diagnosis not present

## 2011-06-13 DIAGNOSIS — Z5189 Encounter for other specified aftercare: Secondary | ICD-10-CM | POA: Diagnosis not present

## 2011-06-13 DIAGNOSIS — N200 Calculus of kidney: Secondary | ICD-10-CM | POA: Diagnosis not present

## 2011-06-13 DIAGNOSIS — R269 Unspecified abnormalities of gait and mobility: Secondary | ICD-10-CM | POA: Diagnosis not present

## 2011-06-16 DIAGNOSIS — N200 Calculus of kidney: Secondary | ICD-10-CM | POA: Diagnosis not present

## 2011-06-16 DIAGNOSIS — R269 Unspecified abnormalities of gait and mobility: Secondary | ICD-10-CM | POA: Diagnosis not present

## 2011-06-16 DIAGNOSIS — Z96619 Presence of unspecified artificial shoulder joint: Secondary | ICD-10-CM | POA: Diagnosis not present

## 2011-06-16 DIAGNOSIS — Z5189 Encounter for other specified aftercare: Secondary | ICD-10-CM | POA: Diagnosis not present

## 2011-06-16 DIAGNOSIS — Z471 Aftercare following joint replacement surgery: Secondary | ICD-10-CM | POA: Diagnosis not present

## 2011-06-18 DIAGNOSIS — Z96619 Presence of unspecified artificial shoulder joint: Secondary | ICD-10-CM | POA: Diagnosis not present

## 2011-06-18 DIAGNOSIS — R269 Unspecified abnormalities of gait and mobility: Secondary | ICD-10-CM | POA: Diagnosis not present

## 2011-06-18 DIAGNOSIS — Z471 Aftercare following joint replacement surgery: Secondary | ICD-10-CM | POA: Diagnosis not present

## 2011-06-18 DIAGNOSIS — N200 Calculus of kidney: Secondary | ICD-10-CM | POA: Diagnosis not present

## 2011-06-18 DIAGNOSIS — Z5189 Encounter for other specified aftercare: Secondary | ICD-10-CM | POA: Diagnosis not present

## 2011-06-19 DIAGNOSIS — Z96619 Presence of unspecified artificial shoulder joint: Secondary | ICD-10-CM | POA: Diagnosis not present

## 2011-06-19 DIAGNOSIS — R269 Unspecified abnormalities of gait and mobility: Secondary | ICD-10-CM | POA: Diagnosis not present

## 2011-06-19 DIAGNOSIS — Z471 Aftercare following joint replacement surgery: Secondary | ICD-10-CM | POA: Diagnosis not present

## 2011-06-19 DIAGNOSIS — N200 Calculus of kidney: Secondary | ICD-10-CM | POA: Diagnosis not present

## 2011-06-19 DIAGNOSIS — Z5189 Encounter for other specified aftercare: Secondary | ICD-10-CM | POA: Diagnosis not present

## 2011-06-23 DIAGNOSIS — N200 Calculus of kidney: Secondary | ICD-10-CM | POA: Diagnosis not present

## 2011-06-23 DIAGNOSIS — R269 Unspecified abnormalities of gait and mobility: Secondary | ICD-10-CM | POA: Diagnosis not present

## 2011-06-23 DIAGNOSIS — Z96619 Presence of unspecified artificial shoulder joint: Secondary | ICD-10-CM | POA: Diagnosis not present

## 2011-06-23 DIAGNOSIS — Z5189 Encounter for other specified aftercare: Secondary | ICD-10-CM | POA: Diagnosis not present

## 2011-06-23 DIAGNOSIS — Z471 Aftercare following joint replacement surgery: Secondary | ICD-10-CM | POA: Diagnosis not present

## 2011-06-24 DIAGNOSIS — I359 Nonrheumatic aortic valve disorder, unspecified: Secondary | ICD-10-CM | POA: Diagnosis not present

## 2011-06-24 DIAGNOSIS — I10 Essential (primary) hypertension: Secondary | ICD-10-CM | POA: Diagnosis not present

## 2011-06-24 DIAGNOSIS — D696 Thrombocytopenia, unspecified: Secondary | ICD-10-CM | POA: Diagnosis not present

## 2011-06-25 DIAGNOSIS — Z471 Aftercare following joint replacement surgery: Secondary | ICD-10-CM | POA: Diagnosis not present

## 2011-06-25 DIAGNOSIS — R269 Unspecified abnormalities of gait and mobility: Secondary | ICD-10-CM | POA: Diagnosis not present

## 2011-06-25 DIAGNOSIS — Z96619 Presence of unspecified artificial shoulder joint: Secondary | ICD-10-CM | POA: Diagnosis not present

## 2011-06-25 DIAGNOSIS — N200 Calculus of kidney: Secondary | ICD-10-CM | POA: Diagnosis not present

## 2011-06-25 DIAGNOSIS — Z5189 Encounter for other specified aftercare: Secondary | ICD-10-CM | POA: Diagnosis not present

## 2011-06-30 DIAGNOSIS — Z471 Aftercare following joint replacement surgery: Secondary | ICD-10-CM | POA: Diagnosis not present

## 2011-06-30 DIAGNOSIS — Z5189 Encounter for other specified aftercare: Secondary | ICD-10-CM | POA: Diagnosis not present

## 2011-06-30 DIAGNOSIS — N200 Calculus of kidney: Secondary | ICD-10-CM | POA: Diagnosis not present

## 2011-06-30 DIAGNOSIS — Z96619 Presence of unspecified artificial shoulder joint: Secondary | ICD-10-CM | POA: Diagnosis not present

## 2011-06-30 DIAGNOSIS — R269 Unspecified abnormalities of gait and mobility: Secondary | ICD-10-CM | POA: Diagnosis not present

## 2011-07-02 DIAGNOSIS — Z471 Aftercare following joint replacement surgery: Secondary | ICD-10-CM | POA: Diagnosis not present

## 2011-07-02 DIAGNOSIS — R269 Unspecified abnormalities of gait and mobility: Secondary | ICD-10-CM | POA: Diagnosis not present

## 2011-07-02 DIAGNOSIS — Z5189 Encounter for other specified aftercare: Secondary | ICD-10-CM | POA: Diagnosis not present

## 2011-07-02 DIAGNOSIS — N200 Calculus of kidney: Secondary | ICD-10-CM | POA: Diagnosis not present

## 2011-07-02 DIAGNOSIS — Z96619 Presence of unspecified artificial shoulder joint: Secondary | ICD-10-CM | POA: Diagnosis not present

## 2011-07-04 DIAGNOSIS — S43429A Sprain of unspecified rotator cuff capsule, initial encounter: Secondary | ICD-10-CM | POA: Diagnosis not present

## 2011-08-15 DIAGNOSIS — M25579 Pain in unspecified ankle and joints of unspecified foot: Secondary | ICD-10-CM | POA: Diagnosis not present

## 2011-09-26 DIAGNOSIS — M25569 Pain in unspecified knee: Secondary | ICD-10-CM | POA: Diagnosis not present

## 2011-12-23 DIAGNOSIS — H00019 Hordeolum externum unspecified eye, unspecified eyelid: Secondary | ICD-10-CM | POA: Diagnosis not present

## 2012-01-05 DIAGNOSIS — H00019 Hordeolum externum unspecified eye, unspecified eyelid: Secondary | ICD-10-CM | POA: Diagnosis not present

## 2012-01-05 DIAGNOSIS — H5710 Ocular pain, unspecified eye: Secondary | ICD-10-CM | POA: Diagnosis not present

## 2012-01-20 DIAGNOSIS — Z0000 Encounter for general adult medical examination without abnormal findings: Secondary | ICD-10-CM | POA: Diagnosis not present

## 2012-01-20 DIAGNOSIS — Z1331 Encounter for screening for depression: Secondary | ICD-10-CM | POA: Diagnosis not present

## 2012-01-20 DIAGNOSIS — D649 Anemia, unspecified: Secondary | ICD-10-CM | POA: Diagnosis not present

## 2012-01-20 DIAGNOSIS — I10 Essential (primary) hypertension: Secondary | ICD-10-CM | POA: Diagnosis not present

## 2012-01-20 DIAGNOSIS — R609 Edema, unspecified: Secondary | ICD-10-CM | POA: Diagnosis not present

## 2012-01-20 DIAGNOSIS — Z79899 Other long term (current) drug therapy: Secondary | ICD-10-CM | POA: Diagnosis not present

## 2012-01-20 DIAGNOSIS — Z23 Encounter for immunization: Secondary | ICD-10-CM | POA: Diagnosis not present

## 2012-02-16 DIAGNOSIS — M1710 Unilateral primary osteoarthritis, unspecified knee: Secondary | ICD-10-CM | POA: Diagnosis not present

## 2012-03-22 ENCOUNTER — Telehealth: Payer: Self-pay | Admitting: Internal Medicine

## 2012-03-22 DIAGNOSIS — D649 Anemia, unspecified: Secondary | ICD-10-CM | POA: Diagnosis not present

## 2012-03-22 NOTE — Telephone Encounter (Signed)
S/W PT IN REF TO NP APPT. ON 04/05/12@1 :30 REFERRING DR Pete Glatter DX-RECURRENT THROMBOCYTOPENIA MAILED NP PACKET

## 2012-03-23 ENCOUNTER — Telehealth: Payer: Self-pay | Admitting: Internal Medicine

## 2012-03-23 NOTE — Telephone Encounter (Signed)
C/D 03/23/12 for appt.04/05/12

## 2012-04-05 ENCOUNTER — Ambulatory Visit (HOSPITAL_BASED_OUTPATIENT_CLINIC_OR_DEPARTMENT_OTHER): Payer: Medicare Other | Admitting: Internal Medicine

## 2012-04-05 ENCOUNTER — Ambulatory Visit: Payer: Medicare Other

## 2012-04-05 ENCOUNTER — Other Ambulatory Visit (HOSPITAL_BASED_OUTPATIENT_CLINIC_OR_DEPARTMENT_OTHER): Payer: Medicare Other | Admitting: Lab

## 2012-04-05 ENCOUNTER — Encounter: Payer: Self-pay | Admitting: Internal Medicine

## 2012-04-05 VITALS — BP 138/72 | HR 79 | Temp 96.7°F | Resp 20 | Ht 64.5 in | Wt 230.0 lb

## 2012-04-05 DIAGNOSIS — D696 Thrombocytopenia, unspecified: Secondary | ICD-10-CM | POA: Diagnosis not present

## 2012-04-05 LAB — CBC WITH DIFFERENTIAL/PLATELET
BASO%: 0.2 % (ref 0.0–2.0)
LYMPH%: 35.4 % (ref 14.0–49.7)
MCHC: 33.6 g/dL (ref 31.5–36.0)
MCV: 83.5 fL (ref 79.5–101.0)
MONO#: 0.5 10*3/uL (ref 0.1–0.9)
MONO%: 9.4 % (ref 0.0–14.0)
Platelets: 143 10*3/uL — ABNORMAL LOW (ref 145–400)
RBC: 4.85 10*6/uL (ref 3.70–5.45)
RDW: 14.6 % — ABNORMAL HIGH (ref 11.2–14.5)
WBC: 4.8 10*3/uL (ref 3.9–10.3)
nRBC: 0 % (ref 0–0)

## 2012-04-05 LAB — COMPREHENSIVE METABOLIC PANEL (CC13)
ALT: 17 U/L (ref 0–55)
AST: 22 U/L (ref 5–34)
Albumin: 3.9 g/dL (ref 3.5–5.0)
CO2: 26 mEq/L (ref 22–29)
Calcium: 9.2 mg/dL (ref 8.4–10.4)
Chloride: 104 mEq/L (ref 98–107)
Potassium: 3.9 mEq/L (ref 3.5–5.1)

## 2012-04-05 LAB — LACTATE DEHYDROGENASE (CC13): LDH: 276 U/L — ABNORMAL HIGH (ref 125–245)

## 2012-04-05 NOTE — Patient Instructions (Signed)
You have low platelets count but you are currently asymptomatic. This is most likely consistent with ITP. I recommended for you observation for now. Followup with me on as-needed basis.

## 2012-04-05 NOTE — Progress Notes (Signed)
Checked in new pt with no financial concerns. °

## 2012-04-05 NOTE — Progress Notes (Signed)
[facility] [phone]   [phone]  CONSULT NOTE  REASON FOR CONSULTATION:  78 years old white female with history of thrombocytopenia  HPI [name] is a 78 y.o. female with past medical history significant for hypertension, osteopenia. She was seen recently by her primary care physician Dr. Merlene [name] for routine physical examination and refill of her blood pressure medications. CBC on 03/22/2012 showed low platelets count of 113,000. Previous CBC from his office showed platelets count has been on was in the lower range. On 04/16/2006 her platelets count were 114,000, on 06/11/2010 platelets count were 118,000, on 06/24/2011 platelets count were normal at 182,000, and on 01/20/2012 platelets count was 130,000. The patient is feeling fine with no specific complaints. She denied having any significant weight loss or night sweats. She denied having any bleeding, bruises or ecchymosis. She does not use any over-the-counter medication except for multivitamins. She occasionally take ibuprofen if needed but not on a regular basis.  The patient was referred to me today for evaluation and recommendation regarding her persistent thrombocytopenia. She denied having any significant chest pain, shortness breath, cough or hemoptysis. She has no palpable lymphadenopathy. Her family history significant for a sister with stomach cancer at age 4. The patient is married and has 2 children. She is currently retired and used to work as a Lawyer. She has no history of smoking but drinks alcohol occasionally and no history of drug abuse. @SFHPI @  Past Medical History  Diagnosis Date  . Complication of anesthesia     trouble waking up  . Hypertension     on treatment  . Arthritis   . Hepatitis     as a child" Liver infection", yellow jauncice  . Venous insufficiency   . History of nephrolithiasis     as child  . Aortic stenosis   . Thrombocytopenia,  unspecified   . Benign tumor of lung     removed  . Osteopenia     Past Surgical History  Procedure Date  . Joint replacement [id number]    rt and lft  . Total shoulder arthroplasty 2005    right  . Abdominal hysterectomy   . Fracture surgery     rt wrist , left hand  . Vascular surgery 2005    varicose vein   . Total knee arthroplasty     bilateral  . Lung lobectomy 2004    rt upper lobe  . Shoulder hemi-arthroplasty 06/03/2011    Procedure: SHOULDER HEMI-ARTHROPLASTY;  Surgeon: Valeria Batman, MD;  Location: [facility] OR;  Service: Orthopedics;  Laterality: Left;  Left Hemi Shoulder Hemi-Arthroplasty     Family History  Problem Relation Age of Onset  . Anesthesia problems Neg Hx   . Hypotension Neg Hx   . Malignant hyperthermia Neg Hx   . Pseudochol deficiency Neg Hx     Social History History  Substance Use Topics  . Smoking status: Never Smoker   . Smokeless tobacco: Never Used  . Alcohol Use: 0.5 oz/week    1 drink(s) per week    Allergies  Allergen Reactions  . Lisinopril Cough  . Micardis (Telmisartan) Other (See Comments)    dizziness    Current Outpatient Prescriptions  Medication Sig Dispense Refill  . amLODipine (NORVASC) 2.5 MG tablet Take 2.5 mg by mouth daily.      . cholecalciferol (VITAMIN D) 1000 UNITS tablet Take 1,000 Units by mouth daily.      . Multiple Vitamin (  MULITIVITAMIN WITH MINERALS) TABS Take 1 tablet by mouth daily.      . Multiple Vitamins-Minerals (ICAPS AREDS FORMULA PO) Take 1 capsule by mouth daily.        Review of Systems  A comprehensive review of systems was negative.  Physical Exam  YNW:GNFAO, healthy, no distress, well nourished and well developed SKIN: skin color, texture, turgor are normal HEAD: Normocephalic, No masses, lesions, tenderness or abnormalities EYES: normal EARS: External ears normal OROPHARYNX:no exudate and no erythema  NECK: supple, no adenopathy LYMPH:  no palpable lymphadenopathy, no  hepatosplenomegaly BREAST:not examined LUNGS: clear to auscultation  HEART: regular rate & rhythm and no murmurs ABDOMEN:abdomen soft, non-tender, obese, normal bowel sounds and no masses or organomegaly BACK: Back symmetric, no curvature. EXTREMITIES:no edema, no skin discoloration, no clubbing  NEURO: alert & oriented x 3 with fluent speech, no focal motor/sensory deficits  PERFORMANCE STATUS: ECOG 0  LABORATORY DATA: Lab Results  Component Value Date   WBC 4.8 04/05/2012   HGB 13.6 04/05/2012   HCT 40.5 04/05/2012   MCV 83.5 04/05/2012   PLT 143* 04/05/2012      Chemistry      Component Value Date/Time   NA 139 04/05/2012 1333   NA 135 06/05/2011 0650   K 3.9 04/05/2012 1333   K 3.9 06/05/2011 0650   CL 104 04/05/2012 1333   CL 101 06/05/2011 0650   CO2 26 04/05/2012 1333   CO2 26 06/05/2011 0650   BUN 20.0 04/05/2012 1333   BUN 11 06/05/2011 0650   CREATININE 0.8 04/05/2012 1333   CREATININE 0.61 06/05/2011 0650      Component Value Date/Time   CALCIUM 9.2 04/05/2012 1333   CALCIUM 8.9 06/05/2011 0650   ALKPHOS 108 04/05/2012 1333   ALKPHOS 105 05/22/2011 1235   AST 22 04/05/2012 1333   AST 19 05/22/2011 1235   ALT 17 04/05/2012 1333   ALT 13 05/22/2011 1235   BILITOT 0.54 04/05/2012 1333   BILITOT 0.4 05/22/2011 1235       RADIOGRAPHIC STUDIES: No results found.  ASSESSMENT: This is a very pleasant 78 years old white female with mild thrombocytopenia most likely idiopathic thrombocytopenic purpura. Her platelets count has always been above 100,000 recently. The patient is currently asymptomatic and has no bleeding issues, bruises or ecchymosis. Her platelets count today are 143,000 (normal range 145,000-400,000).   PLAN: I have a lengthy discussion with the patient and her friend today about her current condition. I recommended for the patient to continue on observation with her primary care physician. I would consider the patient for treatment with steroids or IVIG only if her platelets  count less than 50,000 or she has any significant bleeding issues. The patient agreed to the current plan. I would see the patient on as-needed basis.  All questions were answered. The patient knows to call the clinic with any problems, questions or concerns. We can certainly see the patient much sooner if necessary.  Thank you so much for allowing me to participate in the care of [name]. I will continue to follow up the patient with you and assist in her care.  I spent 25 minutes counseling the patient face to face. The total time spent in the appointment was 50 minutes.  Gaytha Raybourn K. 04/05/2012, 3:05 PM

## 2012-07-07 DIAGNOSIS — I359 Nonrheumatic aortic valve disorder, unspecified: Secondary | ICD-10-CM | POA: Diagnosis not present

## 2012-07-07 DIAGNOSIS — I10 Essential (primary) hypertension: Secondary | ICD-10-CM | POA: Diagnosis not present

## 2012-07-07 DIAGNOSIS — R42 Dizziness and giddiness: Secondary | ICD-10-CM | POA: Diagnosis not present

## 2012-07-07 DIAGNOSIS — I872 Venous insufficiency (chronic) (peripheral): Secondary | ICD-10-CM | POA: Diagnosis not present

## 2012-07-16 DIAGNOSIS — I359 Nonrheumatic aortic valve disorder, unspecified: Secondary | ICD-10-CM | POA: Diagnosis not present

## 2012-07-23 DIAGNOSIS — H3530 Unspecified macular degeneration: Secondary | ICD-10-CM | POA: Diagnosis not present

## 2012-08-09 DIAGNOSIS — I10 Essential (primary) hypertension: Secondary | ICD-10-CM | POA: Diagnosis not present

## 2012-08-09 DIAGNOSIS — S298XXA Other specified injuries of thorax, initial encounter: Secondary | ICD-10-CM | POA: Diagnosis not present

## 2012-08-09 DIAGNOSIS — R42 Dizziness and giddiness: Secondary | ICD-10-CM | POA: Diagnosis not present

## 2012-08-09 DIAGNOSIS — R269 Unspecified abnormalities of gait and mobility: Secondary | ICD-10-CM | POA: Diagnosis not present

## 2012-08-11 DIAGNOSIS — R42 Dizziness and giddiness: Secondary | ICD-10-CM | POA: Diagnosis not present

## 2012-10-20 DIAGNOSIS — I359 Nonrheumatic aortic valve disorder, unspecified: Secondary | ICD-10-CM | POA: Diagnosis not present

## 2012-10-20 DIAGNOSIS — I10 Essential (primary) hypertension: Secondary | ICD-10-CM | POA: Diagnosis not present

## 2012-11-22 DIAGNOSIS — Z79899 Other long term (current) drug therapy: Secondary | ICD-10-CM | POA: Diagnosis not present

## 2012-11-22 DIAGNOSIS — R52 Pain, unspecified: Secondary | ICD-10-CM | POA: Diagnosis not present

## 2012-11-22 DIAGNOSIS — M1710 Unilateral primary osteoarthritis, unspecified knee: Secondary | ICD-10-CM | POA: Diagnosis not present

## 2012-11-22 DIAGNOSIS — M25569 Pain in unspecified knee: Secondary | ICD-10-CM | POA: Diagnosis not present

## 2012-11-29 DIAGNOSIS — M25569 Pain in unspecified knee: Secondary | ICD-10-CM | POA: Diagnosis not present

## 2012-11-30 ENCOUNTER — Other Ambulatory Visit (HOSPITAL_COMMUNITY): Payer: Self-pay | Admitting: Orthopaedic Surgery

## 2012-11-30 DIAGNOSIS — M25562 Pain in left knee: Secondary | ICD-10-CM

## 2012-12-09 ENCOUNTER — Encounter (HOSPITAL_COMMUNITY)
Admission: RE | Admit: 2012-12-09 | Discharge: 2012-12-09 | Disposition: A | Discharge status: Home / Self Care | Payer: Medicare Other | Source: Ambulatory Visit | Attending: Orthopaedic Surgery | Admitting: Orthopaedic Surgery

## 2012-12-09 DIAGNOSIS — Z96659 Presence of unspecified artificial knee joint: Secondary | ICD-10-CM | POA: Insufficient documentation

## 2012-12-09 DIAGNOSIS — M25562 Pain in left knee: Secondary | ICD-10-CM

## 2012-12-09 DIAGNOSIS — M25569 Pain in unspecified knee: Secondary | ICD-10-CM | POA: Insufficient documentation

## 2012-12-09 MED ORDER — TECHNETIUM TC 99M MEDRONATE IV KIT
25.0000 | PACK | Freq: Once | INTRAVENOUS | Status: AC | PRN
  Administered 2012-12-09: 25 via INTRAVENOUS

## 2012-12-14 DIAGNOSIS — M1710 Unilateral primary osteoarthritis, unspecified knee: Secondary | ICD-10-CM | POA: Diagnosis not present

## 2012-12-14 DIAGNOSIS — M25569 Pain in unspecified knee: Secondary | ICD-10-CM | POA: Diagnosis not present

## 2012-12-16 DIAGNOSIS — Z1231 Encounter for screening mammogram for malignant neoplasm of breast: Secondary | ICD-10-CM | POA: Diagnosis not present

## 2012-12-16 DIAGNOSIS — Z780 Asymptomatic menopausal state: Secondary | ICD-10-CM | POA: Diagnosis not present

## 2012-12-23 DIAGNOSIS — M899 Disorder of bone, unspecified: Secondary | ICD-10-CM | POA: Diagnosis not present

## 2012-12-23 DIAGNOSIS — I10 Essential (primary) hypertension: Secondary | ICD-10-CM | POA: Diagnosis not present

## 2012-12-23 DIAGNOSIS — R634 Abnormal weight loss: Secondary | ICD-10-CM | POA: Diagnosis not present

## 2012-12-24 DIAGNOSIS — M1710 Unilateral primary osteoarthritis, unspecified knee: Secondary | ICD-10-CM | POA: Diagnosis not present

## 2013-01-20 DIAGNOSIS — Z79899 Other long term (current) drug therapy: Secondary | ICD-10-CM | POA: Diagnosis not present

## 2013-01-20 DIAGNOSIS — I10 Essential (primary) hypertension: Secondary | ICD-10-CM | POA: Diagnosis not present

## 2013-01-20 DIAGNOSIS — R413 Other amnesia: Secondary | ICD-10-CM | POA: Diagnosis not present

## 2013-01-20 DIAGNOSIS — R5381 Other malaise: Secondary | ICD-10-CM | POA: Diagnosis not present

## 2013-01-28 DIAGNOSIS — M1710 Unilateral primary osteoarthritis, unspecified knee: Secondary | ICD-10-CM | POA: Diagnosis not present

## 2013-03-01 DIAGNOSIS — M1710 Unilateral primary osteoarthritis, unspecified knee: Secondary | ICD-10-CM | POA: Diagnosis not present

## 2013-03-09 DIAGNOSIS — Z23 Encounter for immunization: Secondary | ICD-10-CM | POA: Diagnosis not present

## 2013-03-11 DIAGNOSIS — T148XXA Other injury of unspecified body region, initial encounter: Secondary | ICD-10-CM | POA: Diagnosis not present

## 2013-03-11 DIAGNOSIS — I998 Other disorder of circulatory system: Secondary | ICD-10-CM | POA: Diagnosis not present

## 2013-03-30 DIAGNOSIS — I359 Nonrheumatic aortic valve disorder, unspecified: Secondary | ICD-10-CM | POA: Diagnosis not present

## 2013-03-30 DIAGNOSIS — R413 Other amnesia: Secondary | ICD-10-CM | POA: Diagnosis not present

## 2013-03-30 DIAGNOSIS — Z0000 Encounter for general adult medical examination without abnormal findings: Secondary | ICD-10-CM | POA: Diagnosis not present

## 2013-03-30 DIAGNOSIS — Z1331 Encounter for screening for depression: Secondary | ICD-10-CM | POA: Diagnosis not present

## 2013-03-30 DIAGNOSIS — G479 Sleep disorder, unspecified: Secondary | ICD-10-CM | POA: Diagnosis not present

## 2013-04-12 ENCOUNTER — Ambulatory Visit (HOSPITAL_COMMUNITY): Payer: Medicare Other | Attending: Cardiology | Admitting: Radiology

## 2013-04-12 ENCOUNTER — Encounter: Payer: Self-pay | Admitting: Cardiology

## 2013-04-12 ENCOUNTER — Other Ambulatory Visit (HOSPITAL_COMMUNITY): Payer: Self-pay | Admitting: Geriatric Medicine

## 2013-04-12 ENCOUNTER — Other Ambulatory Visit: Payer: Self-pay

## 2013-04-12 DIAGNOSIS — I359 Nonrheumatic aortic valve disorder, unspecified: Secondary | ICD-10-CM

## 2013-04-12 DIAGNOSIS — E669 Obesity, unspecified: Secondary | ICD-10-CM | POA: Diagnosis not present

## 2013-04-13 NOTE — Progress Notes (Signed)
Echocardiogram performed.  

## 2013-05-17 DIAGNOSIS — M25569 Pain in unspecified knee: Secondary | ICD-10-CM | POA: Diagnosis not present

## 2013-06-02 DIAGNOSIS — M25569 Pain in unspecified knee: Secondary | ICD-10-CM | POA: Diagnosis not present

## 2013-06-02 DIAGNOSIS — I10 Essential (primary) hypertension: Secondary | ICD-10-CM | POA: Diagnosis not present

## 2013-06-02 DIAGNOSIS — R413 Other amnesia: Secondary | ICD-10-CM | POA: Diagnosis not present

## 2013-06-27 DIAGNOSIS — M1710 Unilateral primary osteoarthritis, unspecified knee: Secondary | ICD-10-CM | POA: Diagnosis not present

## 2013-07-05 DIAGNOSIS — I10 Essential (primary) hypertension: Secondary | ICD-10-CM | POA: Diagnosis not present

## 2013-07-05 DIAGNOSIS — T84498A Other mechanical complication of other internal orthopedic devices, implants and grafts, initial encounter: Secondary | ICD-10-CM | POA: Diagnosis not present

## 2013-07-05 DIAGNOSIS — M25569 Pain in unspecified knee: Secondary | ICD-10-CM | POA: Diagnosis not present

## 2013-07-06 ENCOUNTER — Other Ambulatory Visit (HOSPITAL_COMMUNITY): Payer: Self-pay | Admitting: Orthopedic Surgery

## 2013-07-06 DIAGNOSIS — M25562 Pain in left knee: Secondary | ICD-10-CM

## 2013-07-13 ENCOUNTER — Encounter (HOSPITAL_COMMUNITY)
Admission: RE | Admit: 2013-07-13 | Discharge: 2013-07-13 | Disposition: A | Discharge status: Home / Self Care | Payer: Medicare Other | Source: Ambulatory Visit | Attending: Orthopedic Surgery | Admitting: Orthopedic Surgery

## 2013-07-13 ENCOUNTER — Ambulatory Visit (HOSPITAL_COMMUNITY)
Admission: RE | Admit: 2013-07-13 | Discharge: 2013-07-13 | Disposition: A | Discharge status: Home / Self Care | Payer: Medicare Other | Source: Ambulatory Visit | Attending: Orthopedic Surgery | Admitting: Orthopedic Surgery

## 2013-07-13 DIAGNOSIS — Z96659 Presence of unspecified artificial knee joint: Secondary | ICD-10-CM | POA: Insufficient documentation

## 2013-07-13 DIAGNOSIS — M25569 Pain in unspecified knee: Secondary | ICD-10-CM | POA: Diagnosis not present

## 2013-07-13 DIAGNOSIS — M25562 Pain in left knee: Secondary | ICD-10-CM

## 2013-07-13 MED ORDER — TECHNETIUM TC 99M MEDRONATE IV KIT
25.0000 | PACK | Freq: Once | INTRAVENOUS | Status: AC | PRN
  Administered 2013-07-13: 25 via INTRAVENOUS

## 2013-07-15 DIAGNOSIS — M25569 Pain in unspecified knee: Secondary | ICD-10-CM | POA: Diagnosis not present

## 2013-07-15 DIAGNOSIS — Z96659 Presence of unspecified artificial knee joint: Secondary | ICD-10-CM | POA: Diagnosis not present

## 2013-08-02 DIAGNOSIS — M25569 Pain in unspecified knee: Secondary | ICD-10-CM | POA: Diagnosis not present

## 2013-08-02 DIAGNOSIS — Z96659 Presence of unspecified artificial knee joint: Secondary | ICD-10-CM | POA: Diagnosis not present

## 2013-08-25 ENCOUNTER — Ambulatory Visit (INDEPENDENT_AMBULATORY_CARE_PROVIDER_SITE_OTHER): Payer: Medicare Other | Admitting: Cardiology

## 2013-08-25 ENCOUNTER — Encounter: Payer: Self-pay | Admitting: Cardiology

## 2013-08-25 VITALS — BP 148/74 | HR 65 | Ht 66.0 in | Wt 221.0 lb

## 2013-08-25 DIAGNOSIS — Z01810 Encounter for preprocedural cardiovascular examination: Secondary | ICD-10-CM | POA: Diagnosis not present

## 2013-08-25 DIAGNOSIS — M1710 Unilateral primary osteoarthritis, unspecified knee: Secondary | ICD-10-CM

## 2013-08-25 DIAGNOSIS — E669 Obesity, unspecified: Secondary | ICD-10-CM | POA: Diagnosis not present

## 2013-08-25 DIAGNOSIS — I10 Essential (primary) hypertension: Secondary | ICD-10-CM

## 2013-08-25 DIAGNOSIS — IMO0002 Reserved for concepts with insufficient information to code with codable children: Secondary | ICD-10-CM

## 2013-08-25 DIAGNOSIS — I359 Nonrheumatic aortic valve disorder, unspecified: Secondary | ICD-10-CM

## 2013-08-25 DIAGNOSIS — IMO0001 Reserved for inherently not codable concepts without codable children: Secondary | ICD-10-CM

## 2013-08-25 DIAGNOSIS — M179 Osteoarthritis of knee, unspecified: Secondary | ICD-10-CM

## 2013-08-25 DIAGNOSIS — I350 Nonrheumatic aortic (valve) stenosis: Secondary | ICD-10-CM

## 2013-08-25 NOTE — Progress Notes (Addendum)
[address] [place], [place]  [address] Phone: [phone] Fax:  [phone]  Date:  08/25/2013   ID:  Heather LucksRosemarie [name], DOB [birth year], MRN [id number]  PCP:  Heather OttoSTONEKING,Heather THOMAS, MD   History of Present Illness: [name] is a 80 y.o. female with moderate to severe aortic stenosis, mean gradient of 30 mm of mercury on 12/15 here for evaluation. Asymptomatic, no complaints of shortness of breath, angina, syncope. She has significant debilitating osteoarthritis which makes it difficult for her to get around. She denies any history of prior stroke. No prior cardiovascular history such as MI.   [place], her husband is from [place]. She is currently on Aricept, memory impairment. This seems mild.  She does not seem able to complete greater than 4 METs of activity because of her debilitating knee pain.   Wt Readings from Last 3 Encounters:  08/25/13 221 lb (100.245 kg)  04/05/12 230 lb (104.327 kg)  05/22/11 225 lb 11.2 oz (102.377 kg)     Past Medical History  Diagnosis Date  . Complication of anesthesia     trouble waking up  . Hypertension     on treatment  . Arthritis   . Hepatitis     as a child" Liver infection", yellow jauncice  . Venous insufficiency   . History of nephrolithiasis     as child  . Aortic stenosis   . Thrombocytopenia, unspecified   . Benign tumor of lung     removed  . Osteopenia     Past Surgical History  Procedure Laterality Date  . Joint replacement  [id number]    rt and lft  . Total shoulder arthroplasty  2005    right  . Abdominal hysterectomy    . Fracture surgery      rt wrist , left hand  . Vascular surgery  2005    varicose vein   . Total knee arthroplasty      bilateral  . Lung lobectomy  2004    rt upper lobe  . Shoulder hemi-arthroplasty  06/03/2011    Procedure: SHOULDER HEMI-ARTHROPLASTY;  Surgeon: Valeria BatmanPeter W Whitfield, MD;  [facility];  Service: Orthopedics;  Laterality:  Left;  Left Hemi Shoulder Hemi-Arthroplasty     Current Outpatient Prescriptions  Medication Sig Dispense Refill  . amLODipine (NORVASC) 2.5 MG tablet Take 2.5 mg by mouth daily.      . cholecalciferol (VITAMIN D) 1000 UNITS tablet Take 1,000 Units by mouth daily.      Marland Kitchen. donepezil (ARICEPT ODT) 10 MG disintegrating tablet Take 1 tablet by mouth daily.      Marland Kitchen. etodolac (LODINE XL) 400 MG 24 hr tablet Take 1 tablet by mouth daily.      . hydrochlorothiazide (MICROZIDE) 12.5 MG capsule Take 1 capsule by mouth daily.      . Multiple Vitamin (MULITIVITAMIN WITH MINERALS) TABS Take 1 tablet by mouth daily.      . Multiple Vitamins-Minerals (ICAPS AREDS FORMULA PO) Take 1 capsule by mouth daily.       No current facility-administered medications for this visit.    Allergies:    Allergies  Allergen Reactions  . Lisinopril Cough  . Micardis [Telmisartan] Other (See Comments)    dizziness    Social History:  The patient  reports that she has never smoked. She has never used smokeless tobacco. She reports that she drinks about .5 ounces of alcohol per week. She reports  that she does not use illicit drugs.   Family History  Problem Relation Age of Onset  . Anesthesia problems Neg Hx   . Hypotension Neg Hx   . Malignant hyperthermia Neg Hx   . Pseudochol deficiency Neg Hx     ROS:  Please see the history of present illness.   denies any bleeding, syncope, orthopnea, PND, rash. No chest pain   All other systems reviewed and negative.   PHYSICAL EXAM: VS:  BP 148/74  Pulse 65  Wt 221 lb (100.245 kg) Well nourished, well developed, in no acute distress HEENT: normal, [place]/AT, EOMI Neck: no JVD, normal carotid upstroke, radiation of aortic murmur to carotids bilaterally  Cardiac:  normal S1, S2; RRR; 3/6 mid crescendo systolic murmur Lungs:  clear to auscultation bilaterally, no wheezing, rhonchi or rales Abd: soft, nontender, no hepatomegaly, no bruits, overweight Ext: no edema, 2+ distal  pulses, overweight Skin: warm and dry GU: deferred Neuro: no focal abnormalities noted, AAO x 3 MSK: Challenging to ambulate secondary to knee osteoarthritis.  EKG:  08/25/13-sinus rhythm, 65, poor R wave progression, question possible limb lead reversal. Left posterior fascicular block possible.  ECHO: 03/1613 -   - Left ventricle: The cavity size was normal. There was mild focal basal hypertrophy of the septum. Systolic function was normal. The estimated ejection fraction was in the range of 55% to 60%. Wall motion was normal; there were no regional wall motion abnormalities. There was an increased relative contribution of atrial contraction to ventricular filling. Features are consistent with a pseudonormal left ventricular filling pattern, with concomitant abnormal relaxation and increased filling pressure (grade 2 diastolic dysfunction). - Aortic valve: Severe diffuse thickening and calcification. There was moderate to severe stenosis. Mild regurgitation. Mean gradient: 14mm Hg (S). Peak gradient: 46mm Hg (S). - Mitral valve: Calcified annulus. Mildly thickened leaflets . - Right ventricle: The cavity size was mildly dilated. Wall thickness was normal. - Atrial septum: No defect or patent foramen ovale was identified. - Pulmonary arteries: PA peak pressure: 80mm Hg (S).    ASSESSMENT AND PLAN:  1. Moderate to severe aortic stenosis-echocardiogram reviewed. Asymptomatic currently not describing any shortness of breath, angina, syncope. She is quite debilitated by her left knee osteoarthritis. She's had multiple orthopedic procedures, shoulders, knees. Since she is unable to complete greater than 4 METs of activity and knee replacement would be an intermediate risk surgery, I would like to perform a pharmacologic nuclear stress test for further risk stratification.  She would be at increased risk for knee replacement however I would not say that she from a cardiac perspective has  a complete contraindication to surgery. She will let me know immediately if she begins to have symptoms such as shortness of breath. We will continue to monitor her echo cardiogram at least on a yearly basis or sooner if symptoms change. I have explained to them the physiology, pathology behind aortic stenosis and natural course including surgical correction.  2. Osteoarthritis-quite severe, left knee pain quite debilitating. Dr. Cleophas Dunker is orthopedist. 3. Hypertension-continue to treat. Medications reviewed. 4. Further instructions based upon stress test. We'll see back in 6 months.  Signed, Donato Schultz, MD [facility]  08/25/2013 10:13 AM    Addendum 08/30/13: Nuclear stress test was low risk, no ischemia, normal ejection fraction. Reassuring study. She may proceed with knee surgery with low to moderate overall cardiac risk. Proceed.

## 2013-08-25 NOTE — Patient Instructions (Addendum)
Your physician recommends that you continue on your current medications as directed. Please refer to the Current Medication list given to you today.   Your physician wants you to follow-up in: 6 months with Dr. Algis Liming will receive a reminder letter in the mail two months in advance. If you don't receive a letter, please call our office to schedule the follow-up appoint.  Your physician has requested that you have a lexiscan myoview. For further information please visit [email or web address] Please follow instruction sheet, as given.

## 2013-08-26 ENCOUNTER — Telehealth (HOSPITAL_COMMUNITY): Payer: Self-pay

## 2013-08-30 ENCOUNTER — Ambulatory Visit (HOSPITAL_COMMUNITY)
Admission: RE | Admit: 2013-08-30 | Discharge: 2013-08-30 | Disposition: A | Discharge status: Home / Self Care | Payer: Medicare Other | Source: Ambulatory Visit | Attending: Cardiovascular Disease | Admitting: Cardiovascular Disease

## 2013-08-30 DIAGNOSIS — I359 Nonrheumatic aortic valve disorder, unspecified: Secondary | ICD-10-CM

## 2013-08-30 DIAGNOSIS — I350 Nonrheumatic aortic (valve) stenosis: Secondary | ICD-10-CM

## 2013-08-30 MED ORDER — TECHNETIUM TC 99M SESTAMIBI GENERIC - CARDIOLITE
31.2000 | Freq: Once | INTRAVENOUS | Status: AC | PRN
  Administered 2013-08-30: 31.2 via INTRAVENOUS

## 2013-08-30 MED ORDER — REGADENOSON 0.4 MG/5ML IV SOLN
0.4000 mg | Freq: Once | INTRAVENOUS | Status: AC
  Administered 2013-08-30: 0.4 mg via INTRAVENOUS

## 2013-08-30 MED ORDER — AMINOPHYLLINE 25 MG/ML IV SOLN
75.0000 mg | Freq: Once | INTRAVENOUS | Status: AC
  Administered 2013-08-30: 75 mg via INTRAVENOUS

## 2013-08-30 MED ORDER — TECHNETIUM TC 99M SESTAMIBI GENERIC - CARDIOLITE
10.9000 | Freq: Once | INTRAVENOUS | Status: AC | PRN
Start: 2013-08-30 — End: 2013-08-30
  Administered 2013-08-30: 10.9 via INTRAVENOUS

## 2013-08-30 NOTE — Procedures (Addendum)
[facility] [address] [place] [address] [phone]  Cardiology Nuclear Med Study  [name] is a 80 y.o. female     MRN : [id number]     DOB: July 12, 1930  Procedure Date: 08/30/2013  Nuclear Med Background Indication for Stress Test:  Evaluation for Ischemia, Surgical Clearance and Abnormal EKG History:  No prior cardiac or respiratory history reported. No prior NUC MPI for comparison;ECHO on 04/08/2013-aortic valve stenosis;EF=55-60% Cardiac Risk Factors: Hypertension, Obesity and venous insufficiency  Symptoms:  DOE, Fatigue and SOB   Nuclear Pre-Procedure Caffeine/Decaff Intake:  1:00am NPO After: 11am   IV Site: R Hand  IV 0.9% NS with Angio Cath:  22g  Chest Size (in):  n/a IV Started by: Berdie Ogren, RN  Height: 5\' 2"  (1.575 m)  Cup Size: B  BMI:  Body mass index is 40.41 kg/(m^2). Weight:  221 lb (100.245 kg)   Tech Comments:  n/a    Nuclear Med Study 1 or 2 day study: 1 day  Stress Test Type:  Lexiscan  Order Authorizing Provider:  Channing Mutters    Resting Radionuclide: Technetium 18m Sestamibi  Resting Radionuclide Dose: 10.9 mCi   Stress Radionuclide:  Technetium 76m Sestamibi  Stress Radionuclide Dose: 31.2 mCi           Stress Protocol Rest HR: 71 Stress HR: 82  Rest BP: 152/95 Stress BP: 153/93  Exercise Time (min): n/a METS: n/a   Predicted Max HR: 140 bpm % Max HR: 63.57 bpm Rate Pressure Product: 19166  Dose of Adenosine (mg):  n/a Dose of Lexiscan: 0.4 mg  Dose of Atropine (mg): n/a Dose of Dobutamine: n/a mcg/kg/min (at max HR)  Stress Test Technologist: Esperanza Sheets, CCT Nuclear Technologist: Gonzella Lex, CNMT   Rest Procedure:  Myocardial perfusion imaging was performed at rest 45 minutes following the intravenous administration of Technetium 42m Sestamibi. Stress Procedure:  The patient received IV Lexiscan 0.4 mg over 15-seconds.  Technetium 4m Sestamibi injected IV  at 30-seconds.  Patient experienced SOB and Nausea and 75 mg of Aminophylline was administered at 5 minutes. There were no significant changes with Lexiscan.  Quantitative spect images were obtained after a 45 minute delay.  Transient Ischemic Dilatation (Normal <1.22):  1.06 Lung/Heart Ratio (Normal <0.45):  0.27 QGS EDV:  n/a ml QGS ESV:  n/a ml LV Ejection Fraction: Study not gated       Rest ECG: NSR - Normal EKG  Stress ECG: No significant change from baseline ECG  QPS Raw Data Images:  Normal; no motion artifact; normal heart/lung ratio. Stress Images:  Normal homogeneous uptake in all areas of the myocardium. Rest Images:  Normal homogeneous uptake in all areas of the myocardium. Subtraction (SDS):  Normal  Impression Exercise Capacity:  Lexiscan with no exercise. BP Response:  Normal blood pressure response. Clinical Symptoms:  Mild nausea and shortness of breath ECG Impression:  No significant ST segment change suggestive of ischemia. Comparison with Prior Nuclear Study: No previous nuclear study performed  Overall Impression:  Normal stress nuclear study.  LV Wall Motion:  No gated due to ectopy   Lennette Bihari, MD  08/30/2013 5:09 PM

## 2013-09-21 DIAGNOSIS — M25569 Pain in unspecified knee: Secondary | ICD-10-CM | POA: Diagnosis not present

## 2013-09-21 DIAGNOSIS — T84498A Other mechanical complication of other internal orthopedic devices, implants and grafts, initial encounter: Secondary | ICD-10-CM | POA: Diagnosis not present

## 2013-09-21 DIAGNOSIS — Z96659 Presence of unspecified artificial knee joint: Secondary | ICD-10-CM | POA: Diagnosis not present

## 2013-09-22 ENCOUNTER — Encounter (HOSPITAL_COMMUNITY): Payer: Self-pay | Admitting: Pharmacy Technician

## 2013-09-22 NOTE — H&P (Signed)
CHIEF COMPLAINT:  Painful left total knee replacement.   HISTORY OF PRESENT ILLNESS:  [name] is a very pleasant 80 year old white female who had undergone a total knee replacement in October 2004 by Dr. Erasmo Leventhal.  She did have some arthrofibrosis and had a manipulation in January 2005.  She was doing well until October 2014 when she started having pain and discomfort.  An aspiration was obtained which revealed a normal white count and also had a sed rate of 7 and a CRP of 1.2 on her blood.  She had a bone scan on 12/14/2012, and at that time it was felt that she had increased uptake around the left knee replacement.  This was in 3 phases considering loosening.  She did have a bone density which revealed a T score in the left femoral neck at minus 2.0 in August 2014.  She went to see Dr. Dion Saucier on 07/15/2013 for a 2nd opinion and repeated the bone scan which revealed increased 3-phase activity surrounding the left total knee arthroplasty similar to the prior study, but because of the relative chronicity of the findings it was felt that it was unlikely to reflect an infection but more of a chronic loosening or synovitis.  She has returned and had been reevaluated today for revision of her prosthesis.   PAST MEDICAL HISTORY:  Health is somewhat rocky.   PAST SURGICAL HISTORY/HOSPITALIZATIONS:  She has had bilateral shoulder repairs, bilateral knee arthroscopies.  She had a right total knee replacement by Dr. [name] in 2004.  She had a right total knee replacement by Dr. [name] in 2012.  Had a left CTA hemiarthroplasty of the shoulder in 2013.   MEDICATIONS:  Includes donepezil HCl 10 mg tablet, amlodipine besylate 2.5 mg, etodolac ER, hydrochlorothiazide 12.5 mg.   ALLERGIES:  No medication allergies.   FAMILY HISTORY:  Her mother died at age 104 from "old age and heart disease."  Father died at age 44 from old age.  She had 3 brothers, 1 who died at 17 from the war, 1 who died at age 54 from a  motor vehicle accident, and 1 who died at age 49 with what she claims as osteoarthritis.  She had 1 sister who died at age 74 from stomach cancer.   SOCIAL HISTORY:  She is an 80 year old white female who is married.  She is not employed at this time.  She denies the use of tobacco or alcohol.   FOURTEEN-POINT REVIEW OF SYSTEMS:  Denied except for history of upper dentures and lower partial.  She does have occasional nocturia.  She does wear glasses.  Occasional ankle swelling.   PHYSICAL EXAMINATION:  General:  Today reveals an 80 year old white female, well developed, well nourished, obese, alert, pleasant, cooperative, in moderate distress secondary to left knee pain. Vital Signs:  Height 5 feet 3-1/2 inches, weight 214 pounds, BMI 37.3.  Temperature 98.8, pulse 88, respirations 16, blood pressure 136/82. Head:  Normocephalic. Eyes:  Pupils equal, round, and reactive to light and accommodation with extraocular movements intact. Ears/Nose/Throat:  Benign. Chest:  Had good expansion. Lungs:  Essentially clear. Cardiac:  Had a regular rhythm and rate with a grade 3/6 systolic murmur. Pulses:  Were 1+ bilateral and symmetric in the lower extremity. Abdomen:  Obese, soft, nontender.  No masses palpable.  Normal bowel sounds were present. CNS:  Oriented x3, and cranial nerves II through XII grossly intact. Genital/Rectal/Breast:  Not indicated for an orthopedic evaluation. Musculoskeletal:  She has range of  motion from about 5 degrees to 95 degrees at best.  She does have a trace effusion, but she does have also a very tight Ace wrap across her knee.  I examined the knee, and there were no sores or blisters on the knee.  She does have a little bit of varus and valgus jog.     CLINICAL IMPRESSION:   1.  Painful left total knee replacement with loosening. 2.  Possible indolent infection, left total knee replacement. 3.  Cardiac disease. 4.  Exogenous obesity.   RECOMMENDATIONS:   1.  At this  time I have reviewed forms from Dr. Pete GlatterStoneking as well as Dr. Anne FuSkains who is the cardiologist, who felt that she has moderate to severe aortic stenosis and an echocardiogram was reviewed.  It was asymptomatic at the time, and she did not have any shortness of breath, angina, or syncope.  They did do a pharmacological nuclear stress test which had low risk and no ischemia, and according to it a normal ejection fraction.  It was felt to be a reassuring study.  She is considered a risk for knee replacement, but from a cardiac respect there was no complete contraindication to surgery.  Therefore, we are going to continue with planning for a revision total knee replacement on the left. 2.  Procedure, risks, and benefits were fully explained to her in detail.  Also discussed the fact that if there were elevated white cells on her fresh-frozen slides, we would consider putting in an antibiotic spacer.  She is understanding of this.  The procedure was explained again, the complications were explained, and all questions were answered from her husband and herself.  We will proceed in the very near future with the revision total knee on the left.  Oris DroneBrian D. Aleda Granaetrarca, PA-C [facility] [phone]  09/22/2013 4:18 PM

## 2013-09-24 NOTE — Pre-Procedure Instructions (Addendum)
Heather LucksRosemarie Oesterling  09/24/2013   Your procedure is scheduled on:  June 9  [facility] at [facility]:30 AM.  Call this number if you have problems the morning of surgery: [phone]   Remember:   Do not eat food or drink liquids after midnight.   Take these medicines the morning of surgery with A SIP OF WATER: Amlodipine, Aricept,          Take all meds as ordered until day of surgery except as instructed below or per dr   Despina AriasSTOP Multiple Vitamins, ICaps, Lodine, Vitamin D today   STOP/ Do not take Aspirin, Aleve, Naproxen, Advil, Ibuprofen, Vitamin, Herbs,  or Supplements starting 5 days prior (09/28/13)   Do not wear jewelry, make-up or nail polish.  Do not wear lotions, powders, or perfumes. You may wear deodorant.  Do not shave 48 hours prior to surgery. Men may shave face and neck.  Do not bring valuables to the hospital.  [facility] is not responsible for any belongings or valuables.               Contacts, dentures or bridgework may not be worn into surgery.  Leave suitcase in the car. After surgery it may be brought to your room.  For patients admitted to the hospital, discharge time is determined by your treatment team.    Special Instructions:  Special Instructions: [place] - Preparing for Surgery  Before surgery, you can play an important role.  Because skin is not sterile, your skin needs to be as free of germs as possible.  You can reduce the number of germs on you skin by washing with CHG (chlorahexidine gluconate) soap before surgery.  CHG is an antiseptic cleaner which kills germs and bonds with the skin to continue killing germs even after washing.  Please DO NOT use if you have an allergy to CHG or antibacterial soaps.  If your skin becomes reddened/irritated stop using the CHG and inform your nurse when you arrive at Short Stay.  Do not shave (including legs and underarms) for at least 48 hours prior to the first CHG shower.  You may shave  your face.  Please follow these instructions carefully:   1.  Shower with CHG Soap the night before surgery and the morning of Surgery.  2.  If you choose to wash your hair, wash your hair first as usual with your normal shampoo.  3.  After you shampoo, rinse your hair and body thoroughly to remove the Shampoo.  4.  Use CHG as you would any other liquid soap.  You can apply chg directly  to the skin and wash gently with scrungie or a clean washcloth.  5.  Apply the CHG Soap to your body ONLY FROM THE NECK DOWN.  Do not use on open wounds or open sores.  Avoid contact with your eyes ears, mouth and genitals (private parts).  Wash genitals (private parts)       with your normal soap.  6.  Wash thoroughly, paying special attention to the area where your surgery will be performed.  7.  Thoroughly rinse your body with warm water from the neck down.  8.  DO NOT shower/wash with your normal soap after using and rinsing off the CHG Soap.  9.  Pat yourself dry with a clean towel.            10.  Wear clean pajamas.  11.  Place clean sheets on your bed the night of your first shower and do not sleep with pets.  Day of Surgery  Do not apply any lotions/deodorants the morning of surgery.  Please wear clean clothes to the hospital/surgery center.   Please read over the following fact sheets that you were given: Pain Booklet, Coughing and Deep Breathing, Blood Transfusion Information, Total Joint Packet and Surgical Site Infection Prevention

## 2013-09-24 NOTE — Pre-Procedure Instructions (Signed)
[place] - Preparing for Surgery  Before surgery, you can play an important role.  Because skin is not sterile, your skin needs to be as free of germs as possible.  You can reduce the number of germs on you skin by washing with CHG (chlorahexidine gluconate) soap before surgery.  CHG is an antiseptic cleaner which kills germs and bonds with the skin to continue killing germs even after washing.  Please DO NOT use if you have an allergy to CHG or antibacterial soaps.  If your skin becomes reddened/irritated stop using the CHG and inform your nurse when you arrive at Short Stay.  Do not shave (including legs and underarms) for at least 48 hours prior to the first CHG shower.  You may shave your face.  Please follow these instructions carefully:   1.  Shower with CHG Soap the night before surgery and the morning of Surgery.  2.  If you choose to wash your hair, wash your hair first as usual with your normal shampoo.  3.  After you shampoo, rinse your hair and body thoroughly to remove the shampoo.  4.  Use CHG as you would any other liquid soap.  You can apply CHG directly to the skin and wash gently with scrungie or a clean washcloth.  5.  Apply the CHG Soap to your body ONLY FROM THE NECK DOWN.  Do not use on open wounds or open sores.  Avoid contact with your eyes, ears, mouth and genitals (private parts).  Wash genitals (private parts) with your normal soap.  6.  Wash thoroughly, paying special attention to the area where your surgery will be performed.  7.  Thoroughly rinse your body with warm water from the neck down.  8.  DO NOT shower/wash with your normal soap after using and rinsing off the CHG Soap.  9.  Pat yourself dry with a clean towel.            10.  Wear clean pajamas.            11.  Place clean sheets on your bed the night of your first shower and do not sleep with pets.  Day of Surgery  Do not apply any lotions the morning of surgery.  Please wear clean clothes to the  hospital/surgery center.   

## 2013-09-26 ENCOUNTER — Encounter (HOSPITAL_COMMUNITY): Payer: Self-pay

## 2013-09-26 ENCOUNTER — Encounter (HOSPITAL_COMMUNITY)
Admission: RE | Admit: 2013-09-26 | Discharge: 2013-09-26 | Disposition: A | Discharge status: Home / Self Care | Payer: Medicare Other | Source: Ambulatory Visit | Attending: Orthopedic Surgery | Admitting: Orthopedic Surgery

## 2013-09-26 ENCOUNTER — Encounter (HOSPITAL_COMMUNITY)
Admission: RE | Admit: 2013-09-26 | Discharge: 2013-09-26 | Disposition: A | Discharge status: Home / Self Care | Payer: Medicare Other | Source: Ambulatory Visit | Attending: Orthopaedic Surgery | Admitting: Orthopaedic Surgery

## 2013-09-26 DIAGNOSIS — Z01810 Encounter for preprocedural cardiovascular examination: Secondary | ICD-10-CM | POA: Insufficient documentation

## 2013-09-26 DIAGNOSIS — Z01812 Encounter for preprocedural laboratory examination: Secondary | ICD-10-CM | POA: Insufficient documentation

## 2013-09-26 DIAGNOSIS — Z01818 Encounter for other preprocedural examination: Secondary | ICD-10-CM | POA: Diagnosis not present

## 2013-09-26 HISTORY — DX: Personal history of urinary calculi: Z87.442

## 2013-09-26 HISTORY — DX: Cardiac murmur, unspecified: R01.1

## 2013-09-26 LAB — CBC WITH DIFFERENTIAL/PLATELET
BASOS ABS: 0 10*3/uL (ref 0.0–0.1)
Basophils Relative: 0 % (ref 0–1)
Eosinophils Absolute: 0.1 10*3/uL (ref 0.0–0.7)
Eosinophils Relative: 2 % (ref 0–5)
HCT: 38.1 % (ref 36.0–46.0)
Hemoglobin: 12.9 g/dL (ref 12.0–15.0)
LYMPHS ABS: 1.3 10*3/uL (ref 0.7–4.0)
LYMPHS PCT: 26 % (ref 12–46)
MCH: 28.5 pg (ref 26.0–34.0)
MCHC: 33.9 g/dL (ref 30.0–36.0)
MCV: 84.1 fL (ref 78.0–100.0)
Monocytes Absolute: 0.6 10*3/uL (ref 0.1–1.0)
Monocytes Relative: 11 % (ref 3–12)
NEUTROS PCT: 61 % (ref 43–77)
Neutro Abs: 3.2 10*3/uL (ref 1.7–7.7)
PLATELETS: 170 10*3/uL (ref 150–400)
RBC: 4.53 MIL/uL (ref 3.87–5.11)
RDW: 14.4 % (ref 11.5–15.5)
WBC: 5.2 10*3/uL (ref 4.0–10.5)

## 2013-09-26 LAB — COMPREHENSIVE METABOLIC PANEL
ALK PHOS: 102 U/L (ref 39–117)
ALT: 12 U/L (ref 0–35)
AST: 21 U/L (ref 0–37)
Albumin: 4 g/dL (ref 3.5–5.2)
BUN: 18 mg/dL (ref 6–23)
CO2: 25 meq/L (ref 19–32)
Calcium: 9.5 mg/dL (ref 8.4–10.5)
Chloride: 102 mEq/L (ref 96–112)
Creatinine, Ser: 0.68 mg/dL (ref 0.50–1.10)
GFR calc Af Amer: >90 mL/min (ref >90)
GFR, EST NON AFRICAN AMERICAN: 80 mL/min — AB (ref >90)
Glucose, Bld: 108 mg/dL — ABNORMAL HIGH (ref 70–99)
POTASSIUM: 4.6 meq/L (ref 3.7–5.3)
SODIUM: 140 meq/L (ref 137–147)
TOTAL PROTEIN: 7.4 g/dL (ref 6.0–8.3)
Total Bilirubin: 0.4 mg/dL (ref 0.3–1.2)

## 2013-09-26 LAB — URINALYSIS, ROUTINE W REFLEX MICROSCOPIC
Bilirubin Urine: NEGATIVE
Glucose, UA: NEGATIVE mg/dL
Hgb urine dipstick: NEGATIVE
KETONES UR: NEGATIVE mg/dL
LEUKOCYTES UA: NEGATIVE
Nitrite: NEGATIVE
PROTEIN: NEGATIVE mg/dL
Specific Gravity, Urine: 1.027 (ref 1.005–1.030)
UROBILINOGEN UA: 0.2 mg/dL (ref 0.0–1.0)
pH: 5 (ref 5.0–8.0)

## 2013-09-26 LAB — PROTIME-INR
INR: 1.15 (ref 0.00–1.49)
Prothrombin Time: 14.5 seconds (ref 11.6–15.2)

## 2013-09-26 LAB — APTT: APTT: 35 s (ref 24–37)

## 2013-09-26 LAB — SURGICAL PCR SCREEN
MRSA, PCR: NEGATIVE
STAPHYLOCOCCUS AUREUS: NEGATIVE

## 2013-09-27 ENCOUNTER — Encounter (HOSPITAL_COMMUNITY): Payer: Self-pay

## 2013-09-27 LAB — URINE CULTURE: Colony Count: 8000

## 2013-09-27 NOTE — Progress Notes (Signed)
Anesthesia Chart Review:  Patient is a 80 year old female scheduled for revision of left TKR, replacement versus antibiotic spacer on 10/04/13 by Dr. Cleophas Dunker.  History includes hypertension, moderate to severe aortic stenosis by 03/2013 echo, thrombocytopenia (most likely idiopathic thrombocytopenic purpura with PLT count remaining above 100K since 04/2011 so hematologist Dr. Arbutus Ped recommended continued observation by her PCP with PRN hematology follow-up as of 03/2012), varicose veins with venous insufficiency s/p surgery '05, nephrolithiasis, ovarian cancer status post hysterectomy and nephrectomy, bilateral TKR, right shoulder replacement, hepatitis/jaundice as a child, right upper lobectomy for benign lung tumor (hamartoma). PCP is Dr. Pete Glatter.  Cardiologist is Dr. Donato Schultz. He saw her on 08/25/13 for follow-up and preoperative evaluation.  According to his note, "She would be at increased risk for knee replacement however I would not say that she from a cardiac perspective has a complete contraindication to surgery. She will let me know immediately if she begins to have symptoms such as shortness of breath. We will continue to monitor her echo cardiogram at least on a yearly basis or sooner if symptoms change." He did do a nuclear stress test which was low risk, and he felt she could proceed with surgery.  EKG on 08/25/13 showed NSR, left posterior fascicular block, cannot rule out anterior infarct, age undetermined.  Nuclear stress test on 5/50/15 showed: Overall Impression: Normal stress nuclear study. LV Wall Motion: No gated due to ectopy.  Echo on 04/12/13 showed: - Left ventricle: The cavity size was normal. There was mild focal basal hypertrophy of the septum. Systolic function was normal. The estimated ejection fraction was in the range of 55% to 60%. Wall motion was normal; there were no regional wall motion abnormalities. There was an increased relative contribution of atrial contraction  to ventricular filling. Features are consistent with a pseudonormal left ventricular filling pattern, with concomitant abnormal relaxation and increased filling pressure (grade 2 diastolic dysfunction). - Aortic valve: Severe diffuse thickening and calcification. There was moderate to severe stenosis. Mild regurgitation. Mean gradient: 59mm Hg (S). Peak gradient: 75mm Hg (S). - Mitral valve: Calcified annulus. Mildly thickened leaflets. - Right ventricle: The cavity size was mildly dilated. Wall thickness was normal. - Atrial septum: No defect or patent foramen ovale was identified. - Pulmonary arteries: PA peak pressure: 75mm Hg (S).  CXR on 09/26/13 showed: No acute cardiopulmonary disease. Mild inferior endplate compression fracture thoracolumbar junction is age indeterminate.  Preoperative labs noted. PLT count 170K.  Patient with moderate to severe AS, but with clearance from her cardiologist. If no new CV/CHF symptoms then I would anticipate that she could proceed as planned.  Further evaluation by her assigned anesthesiologist on the day of surgery.  Velna Ochs [facility] Short Stay Center/Anesthesiology Phone [phone] 09/27/2013 5:04 PM

## 2013-10-03 MED ORDER — CHLORHEXIDINE GLUCONATE 4 % EX LIQD
60.0000 mL | Freq: Every day | CUTANEOUS | Status: DC
  Filled 2013-10-03: qty 60

## 2013-10-03 MED ORDER — ACETAMINOPHEN 10 MG/ML IV SOLN
1000.0000 mg | Freq: Four times a day (QID) | INTRAVENOUS | Status: DC
  Administered 2013-10-04: 1000 mg via INTRAVENOUS
  Filled 2013-10-03: qty 100

## 2013-10-03 MED ORDER — CHLORHEXIDINE GLUCONATE 4 % EX LIQD
60.0000 mL | Freq: Once | CUTANEOUS | Status: DC
  Filled 2013-10-03: qty 60

## 2013-10-03 MED ORDER — CEFAZOLIN SODIUM-DEXTROSE 2-3 GM-% IV SOLR
2.0000 g | INTRAVENOUS | Status: AC
  Administered 2013-10-04 (×2): 2 g via INTRAVENOUS
  Filled 2013-10-03: qty 50

## 2013-10-04 ENCOUNTER — Encounter (HOSPITAL_COMMUNITY)
Admission: RE | Disposition: A | Discharge status: Skilled Nursing Facility | Payer: PRIVATE HEALTH INSURANCE | Source: Ambulatory Visit | Attending: Orthopaedic Surgery

## 2013-10-04 ENCOUNTER — Encounter (HOSPITAL_COMMUNITY): Payer: Self-pay | Admitting: *Deleted

## 2013-10-04 ENCOUNTER — Encounter (HOSPITAL_COMMUNITY): Payer: Medicare Other | Admitting: Vascular Surgery

## 2013-10-04 ENCOUNTER — Inpatient Hospital Stay (HOSPITAL_COMMUNITY)
Admission: RE | Admit: 2013-10-04 | Discharge: 2013-10-07 | DRG: 467 | Disposition: A | Discharge status: Skilled Nursing Facility | Payer: Medicare Other | Source: Ambulatory Visit | Attending: Orthopaedic Surgery | Admitting: Orthopaedic Surgery

## 2013-10-04 ENCOUNTER — Inpatient Hospital Stay (HOSPITAL_COMMUNITY): Payer: Medicare Other | Admitting: Anesthesiology

## 2013-10-04 DIAGNOSIS — Z8249 Family history of ischemic heart disease and other diseases of the circulatory system: Secondary | ICD-10-CM

## 2013-10-04 DIAGNOSIS — E876 Hypokalemia: Secondary | ICD-10-CM | POA: Diagnosis not present

## 2013-10-04 DIAGNOSIS — D62 Acute posthemorrhagic anemia: Secondary | ICD-10-CM | POA: Diagnosis not present

## 2013-10-04 DIAGNOSIS — T8489XA Other specified complication of internal orthopedic prosthetic devices, implants and grafts, initial encounter: Secondary | ICD-10-CM | POA: Diagnosis not present

## 2013-10-04 DIAGNOSIS — Y831 Surgical operation with implant of artificial internal device as the cause of abnormal reaction of the patient, or of later complication, without mention of misadventure at the time of the procedure: Secondary | ICD-10-CM | POA: Diagnosis present

## 2013-10-04 DIAGNOSIS — M6281 Muscle weakness (generalized): Secondary | ICD-10-CM | POA: Diagnosis not present

## 2013-10-04 DIAGNOSIS — Z7901 Long term (current) use of anticoagulants: Secondary | ICD-10-CM

## 2013-10-04 DIAGNOSIS — R269 Unspecified abnormalities of gait and mobility: Secondary | ICD-10-CM | POA: Diagnosis not present

## 2013-10-04 DIAGNOSIS — S99919A Unspecified injury of unspecified ankle, initial encounter: Secondary | ICD-10-CM | POA: Diagnosis not present

## 2013-10-04 DIAGNOSIS — M159 Polyosteoarthritis, unspecified: Secondary | ICD-10-CM | POA: Diagnosis not present

## 2013-10-04 DIAGNOSIS — S8990XA Unspecified injury of unspecified lower leg, initial encounter: Secondary | ICD-10-CM | POA: Diagnosis not present

## 2013-10-04 DIAGNOSIS — R110 Nausea: Secondary | ICD-10-CM | POA: Diagnosis not present

## 2013-10-04 DIAGNOSIS — T84039A Mechanical loosening of unspecified internal prosthetic joint, initial encounter: Principal | ICD-10-CM | POA: Diagnosis present

## 2013-10-04 DIAGNOSIS — G8918 Other acute postprocedural pain: Secondary | ICD-10-CM | POA: Diagnosis not present

## 2013-10-04 DIAGNOSIS — I872 Venous insufficiency (chronic) (peripheral): Secondary | ICD-10-CM | POA: Diagnosis not present

## 2013-10-04 DIAGNOSIS — Z6841 Body Mass Index (BMI) 40.0 and over, adult: Secondary | ICD-10-CM | POA: Diagnosis not present

## 2013-10-04 DIAGNOSIS — IMO0001 Reserved for inherently not codable concepts without codable children: Secondary | ICD-10-CM

## 2013-10-04 DIAGNOSIS — I359 Nonrheumatic aortic valve disorder, unspecified: Secondary | ICD-10-CM | POA: Diagnosis not present

## 2013-10-04 DIAGNOSIS — E669 Obesity, unspecified: Secondary | ICD-10-CM | POA: Diagnosis not present

## 2013-10-04 DIAGNOSIS — Z96619 Presence of unspecified artificial shoulder joint: Secondary | ICD-10-CM

## 2013-10-04 DIAGNOSIS — I350 Nonrheumatic aortic (valve) stenosis: Secondary | ICD-10-CM

## 2013-10-04 DIAGNOSIS — Z8543 Personal history of malignant neoplasm of ovary: Secondary | ICD-10-CM

## 2013-10-04 DIAGNOSIS — Z87442 Personal history of urinary calculi: Secondary | ICD-10-CM

## 2013-10-04 DIAGNOSIS — M1710 Unilateral primary osteoarthritis, unspecified knee: Secondary | ICD-10-CM | POA: Diagnosis not present

## 2013-10-04 DIAGNOSIS — Z471 Aftercare following joint replacement surgery: Secondary | ICD-10-CM | POA: Diagnosis not present

## 2013-10-04 DIAGNOSIS — Z96659 Presence of unspecified artificial knee joint: Secondary | ICD-10-CM | POA: Diagnosis not present

## 2013-10-04 DIAGNOSIS — M899 Disorder of bone, unspecified: Secondary | ICD-10-CM | POA: Diagnosis not present

## 2013-10-04 DIAGNOSIS — Z800 Family history of malignant neoplasm of digestive organs: Secondary | ICD-10-CM

## 2013-10-04 DIAGNOSIS — I10 Essential (primary) hypertension: Secondary | ICD-10-CM | POA: Diagnosis not present

## 2013-10-04 DIAGNOSIS — D696 Thrombocytopenia, unspecified: Secondary | ICD-10-CM

## 2013-10-04 DIAGNOSIS — Z79899 Other long term (current) drug therapy: Secondary | ICD-10-CM

## 2013-10-04 DIAGNOSIS — M25569 Pain in unspecified knee: Secondary | ICD-10-CM | POA: Diagnosis not present

## 2013-10-04 DIAGNOSIS — Z5189 Encounter for other specified aftercare: Secondary | ICD-10-CM | POA: Diagnosis not present

## 2013-10-04 DIAGNOSIS — M659 Synovitis and tenosynovitis, unspecified: Secondary | ICD-10-CM | POA: Diagnosis not present

## 2013-10-04 DIAGNOSIS — T8450XA Infection and inflammatory reaction due to unspecified internal joint prosthesis, initial encounter: Secondary | ICD-10-CM | POA: Diagnosis not present

## 2013-10-04 DIAGNOSIS — T84498A Other mechanical complication of other internal orthopedic devices, implants and grafts, initial encounter: Secondary | ICD-10-CM | POA: Diagnosis not present

## 2013-10-04 HISTORY — PX: TOTAL KNEE REVISION: SHX996

## 2013-10-04 HISTORY — PX: REVISION TOTAL KNEE ARTHROPLASTY: SUR1280

## 2013-10-04 LAB — HEMOGLOBIN: Hemoglobin: 10.2 g/dL — ABNORMAL LOW (ref 12.0–15.0)

## 2013-10-04 SURGERY — TOTAL KNEE REVISION
Anesthesia: General | Site: Knee | Laterality: Left

## 2013-10-04 MED ORDER — KETOROLAC TROMETHAMINE 15 MG/ML IJ SOLN
INTRAMUSCULAR | Status: AC
  Filled 2013-10-04: qty 1

## 2013-10-04 MED ORDER — FENTANYL CITRATE 0.05 MG/ML IJ SOLN
INTRAMUSCULAR | Status: AC
  Filled 2013-10-04: qty 5

## 2013-10-04 MED ORDER — OXYCODONE HCL 5 MG/5ML PO SOLN: 5.0000 mg | Freq: Once | ORAL | Status: DC | PRN

## 2013-10-04 MED ORDER — MAGNESIUM HYDROXIDE 400 MG/5ML PO SUSP: 30.0000 mL | Freq: Every day | ORAL | Status: DC | PRN

## 2013-10-04 MED ORDER — FENTANYL CITRATE 0.05 MG/ML IJ SOLN
INTRAMUSCULAR | Status: DC | PRN
  Administered 2013-10-04 (×4): 50 ug via INTRAVENOUS
  Administered 2013-10-04: 100 ug via INTRAVENOUS
  Administered 2013-10-04: 50 ug via INTRAVENOUS

## 2013-10-04 MED ORDER — GELATIN ABSORBABLE 12-7 MM EX MISC
CUTANEOUS | Status: DC | PRN
  Administered 2013-10-04: 1

## 2013-10-04 MED ORDER — RIVAROXABAN 10 MG PO TABS
10.0000 mg | ORAL_TABLET | ORAL | Status: DC
  Administered 2013-10-05 – 2013-10-07 (×3): 10 mg via ORAL
  Filled 2013-10-04 (×5): qty 1

## 2013-10-04 MED ORDER — ACETAMINOPHEN 325 MG PO TABS
650.0000 mg | ORAL_TABLET | Freq: Four times a day (QID) | ORAL | Status: DC | PRN
  Administered 2013-10-05: 650 mg via ORAL
  Filled 2013-10-04 (×2): qty 2

## 2013-10-04 MED ORDER — PHENYLEPHRINE HCL 10 MG/ML IJ SOLN
10.0000 mg | INTRAVENOUS | Status: DC | PRN
  Administered 2013-10-04: 100 ug/min via INTRAVENOUS

## 2013-10-04 MED ORDER — BISACODYL 10 MG RE SUPP: 10.0000 mg | Freq: Every day | RECTAL | Status: DC | PRN

## 2013-10-04 MED ORDER — ACETAMINOPHEN 650 MG RE SUPP: 650.0000 mg | Freq: Four times a day (QID) | RECTAL | Status: DC | PRN

## 2013-10-04 MED ORDER — ONDANSETRON HCL 4 MG/2ML IJ SOLN
4.0000 mg | Freq: Four times a day (QID) | INTRAMUSCULAR | Status: DC | PRN
  Administered 2013-10-04: 4 mg via INTRAVENOUS
  Filled 2013-10-04: qty 2

## 2013-10-04 MED ORDER — METHOCARBAMOL 500 MG PO TABS
500.0000 mg | ORAL_TABLET | Freq: Four times a day (QID) | ORAL | Status: DC | PRN
  Administered 2013-10-05 – 2013-10-06 (×3): 500 mg via ORAL
  Filled 2013-10-04 (×3): qty 1

## 2013-10-04 MED ORDER — CEFAZOLIN SODIUM-DEXTROSE 2-3 GM-% IV SOLR
INTRAVENOUS | Status: AC
  Filled 2013-10-04: qty 50

## 2013-10-04 MED ORDER — PHENOL 1.4 % MT LIQD: 1.0000 | OROMUCOSAL | Status: DC | PRN

## 2013-10-04 MED ORDER — FLEET ENEMA 7-19 GM/118ML RE ENEM: 1.0000 | ENEMA | Freq: Once | RECTAL | Status: AC | PRN

## 2013-10-04 MED ORDER — PHENYLEPHRINE HCL 10 MG/ML IJ SOLN
INTRAMUSCULAR | Status: DC | PRN
  Administered 2013-10-04: 80 ug via INTRAVENOUS
  Administered 2013-10-04: 200 ug via INTRAVENOUS
  Administered 2013-10-04 (×2): 160 ug via INTRAVENOUS
  Administered 2013-10-04: 200 ug via INTRAVENOUS

## 2013-10-04 MED ORDER — KETOROLAC TROMETHAMINE 0.5 % OP SOLN
1.0000 [drp] | Freq: Four times a day (QID) | OPHTHALMIC | Status: DC
  Filled 2013-10-04: qty 3

## 2013-10-04 MED ORDER — ACETAMINOPHEN 10 MG/ML IV SOLN
1000.0000 mg | Freq: Four times a day (QID) | INTRAVENOUS | Status: DC
  Administered 2013-10-04 – 2013-10-05 (×3): 1000 mg via INTRAVENOUS
  Filled 2013-10-04 (×4): qty 100

## 2013-10-04 MED ORDER — SODIUM CHLORIDE 0.9 % IV SOLN
75.0000 mL/h | INTRAVENOUS | Status: DC
  Administered 2013-10-04: 75 mL/h via INTRAVENOUS
  Administered 2013-10-05: 10 mL/h via INTRAVENOUS

## 2013-10-04 MED ORDER — DONEPEZIL HCL 10 MG PO TBDP: 10.0000 mg | ORAL_TABLET | Freq: Every day | ORAL | Status: DC

## 2013-10-04 MED ORDER — HYDROCHLOROTHIAZIDE 12.5 MG PO CAPS
12.5000 mg | ORAL_CAPSULE | Freq: Every day | ORAL | Status: DC
  Administered 2013-10-05 – 2013-10-07 (×3): 12.5 mg via ORAL
  Filled 2013-10-04 (×4): qty 1

## 2013-10-04 MED ORDER — TOBRAMYCIN SULFATE 1.2 G IJ SOLR
INTRAMUSCULAR | Status: DC | PRN
  Administered 2013-10-04 (×2): 1.2 g

## 2013-10-04 MED ORDER — SODIUM CHLORIDE 0.9 % IV SOLN: INTRAVENOUS | Status: DC

## 2013-10-04 MED ORDER — CEFAZOLIN SODIUM-DEXTROSE 2-3 GM-% IV SOLR
2.0000 g | Freq: Four times a day (QID) | INTRAVENOUS | Status: AC
  Administered 2013-10-04 – 2013-10-06 (×6): 2 g via INTRAVENOUS
  Filled 2013-10-04 (×7): qty 50

## 2013-10-04 MED ORDER — ARTIFICIAL TEARS OP OINT
TOPICAL_OINTMENT | OPHTHALMIC | Status: AC
  Filled 2013-10-04: qty 3.5

## 2013-10-04 MED ORDER — ONDANSETRON HCL 4 MG/2ML IJ SOLN
INTRAMUSCULAR | Status: DC | PRN
  Administered 2013-10-04: 4 mg via INTRAVENOUS

## 2013-10-04 MED ORDER — AMLODIPINE BESYLATE 2.5 MG PO TABS
2.5000 mg | ORAL_TABLET | Freq: Every day | ORAL | Status: DC
  Administered 2013-10-05 – 2013-10-07 (×3): 2.5 mg via ORAL
  Filled 2013-10-04 (×4): qty 1

## 2013-10-04 MED ORDER — HYDROMORPHONE HCL PF 1 MG/ML IJ SOLN
0.5000 mg | INTRAMUSCULAR | Status: DC | PRN
  Administered 2013-10-04 – 2013-10-05 (×4): 0.5 mg via INTRAVENOUS
  Filled 2013-10-04 (×4): qty 1

## 2013-10-04 MED ORDER — SODIUM CHLORIDE 0.9 % IR SOLN
Status: DC | PRN
  Administered 2013-10-04: 1000 mL

## 2013-10-04 MED ORDER — THROMBIN 20000 UNITS EX KIT
PACK | CUTANEOUS | Status: DC | PRN
  Administered 2013-10-04: 20000 [IU] via TOPICAL

## 2013-10-04 MED ORDER — LIDOCAINE HCL (CARDIAC) 20 MG/ML IV SOLN
INTRAVENOUS | Status: AC
  Filled 2013-10-04: qty 5

## 2013-10-04 MED ORDER — TOBRAMYCIN SULFATE 1.2 G IJ SOLR
INTRAMUSCULAR | Status: AC
  Filled 2013-10-04: qty 2.4

## 2013-10-04 MED ORDER — PHENYLEPHRINE 40 MCG/ML (10ML) SYRINGE FOR IV PUSH (FOR BLOOD PRESSURE SUPPORT)
PREFILLED_SYRINGE | INTRAVENOUS | Status: AC
  Filled 2013-10-04: qty 20

## 2013-10-04 MED ORDER — ONDANSETRON HCL 4 MG/2ML IJ SOLN: 4.0000 mg | Freq: Once | INTRAMUSCULAR | Status: DC | PRN

## 2013-10-04 MED ORDER — MIDAZOLAM HCL 2 MG/2ML IJ SOLN
INTRAMUSCULAR | Status: AC
  Filled 2013-10-04: qty 2

## 2013-10-04 MED ORDER — ALBUMIN HUMAN 5 % IV SOLN
12.5000 g | Freq: Once | INTRAVENOUS | Status: AC
  Administered 2013-10-04: 12.5 g via INTRAVENOUS

## 2013-10-04 MED ORDER — OXYCODONE HCL 5 MG PO TABS: 5.0000 mg | ORAL_TABLET | Freq: Once | ORAL | Status: DC | PRN

## 2013-10-04 MED ORDER — METOCLOPRAMIDE HCL 10 MG PO TABS: 5.0000 mg | ORAL_TABLET | Freq: Three times a day (TID) | ORAL | Status: DC | PRN

## 2013-10-04 MED ORDER — DONEPEZIL HCL 10 MG PO TABS
10.0000 mg | ORAL_TABLET | Freq: Every day | ORAL | Status: DC
  Administered 2013-10-04 – 2013-10-07 (×4): 10 mg via ORAL
  Filled 2013-10-04 (×4): qty 1

## 2013-10-04 MED ORDER — METHOCARBAMOL 1000 MG/10ML IJ SOLN
500.0000 mg | Freq: Four times a day (QID) | INTRAVENOUS | Status: DC | PRN
  Filled 2013-10-04: qty 5

## 2013-10-04 MED ORDER — MENTHOL 3 MG MT LOZG: 1.0000 | LOZENGE | OROMUCOSAL | Status: DC | PRN

## 2013-10-04 MED ORDER — PROPOFOL 10 MG/ML IV BOLUS
INTRAVENOUS | Status: AC
  Filled 2013-10-04: qty 20

## 2013-10-04 MED ORDER — THROMBIN 20000 UNITS EX KIT
PACK | CUTANEOUS | Status: AC
  Filled 2013-10-04: qty 1

## 2013-10-04 MED ORDER — PROPOFOL 10 MG/ML IV BOLUS
INTRAVENOUS | Status: DC | PRN
  Administered 2013-10-04: 50 mg via INTRAVENOUS
  Administered 2013-10-04: 150 mg via INTRAVENOUS

## 2013-10-04 MED ORDER — ONDANSETRON HCL 4 MG/2ML IJ SOLN
INTRAMUSCULAR | Status: AC
  Filled 2013-10-04: qty 2

## 2013-10-04 MED ORDER — MIDAZOLAM HCL 5 MG/5ML IJ SOLN
INTRAMUSCULAR | Status: DC | PRN
  Administered 2013-10-04 (×2): 1 mg via INTRAVENOUS

## 2013-10-04 MED ORDER — ALBUMIN HUMAN 5 % IV SOLN
INTRAVENOUS | Status: AC
  Administered 2013-10-04: 12.5 g via INTRAVENOUS
  Filled 2013-10-04: qty 250

## 2013-10-04 MED ORDER — KETOROLAC TROMETHAMINE 15 MG/ML IJ SOLN
7.5000 mg | Freq: Four times a day (QID) | INTRAMUSCULAR | Status: AC
  Administered 2013-10-04 – 2013-10-05 (×3): 7.5 mg via INTRAVENOUS

## 2013-10-04 MED ORDER — HYDROMORPHONE HCL PF 1 MG/ML IJ SOLN: 0.2500 mg | INTRAMUSCULAR | Status: DC | PRN

## 2013-10-04 MED ORDER — ONDANSETRON HCL 4 MG PO TABS: 4.0000 mg | ORAL_TABLET | Freq: Four times a day (QID) | ORAL | Status: DC | PRN

## 2013-10-04 MED ORDER — ROCURONIUM BROMIDE 50 MG/5ML IV SOLN
INTRAVENOUS | Status: AC
  Filled 2013-10-04: qty 1

## 2013-10-04 MED ORDER — ALUM & MAG HYDROXIDE-SIMETH 200-200-20 MG/5ML PO SUSP: 30.0000 mL | ORAL | Status: DC | PRN

## 2013-10-04 MED ORDER — LACTATED RINGERS IV SOLN
INTRAVENOUS | Status: DC | PRN
  Administered 2013-10-04 (×3): via INTRAVENOUS

## 2013-10-04 MED ORDER — BUPIVACAINE-EPINEPHRINE (PF) 0.5% -1:200000 IJ SOLN
INTRAMUSCULAR | Status: AC
  Filled 2013-10-04: qty 30

## 2013-10-04 MED ORDER — OXYCODONE HCL 5 MG PO TABS
5.0000 mg | ORAL_TABLET | ORAL | Status: DC | PRN
  Administered 2013-10-04 – 2013-10-07 (×10): 10 mg via ORAL
  Filled 2013-10-04 (×11): qty 2

## 2013-10-04 MED ORDER — DOCUSATE SODIUM 100 MG PO CAPS
100.0000 mg | ORAL_CAPSULE | Freq: Two times a day (BID) | ORAL | Status: DC
  Administered 2013-10-04 – 2013-10-07 (×5): 100 mg via ORAL
  Filled 2013-10-04 (×8): qty 1

## 2013-10-04 MED ORDER — LIDOCAINE HCL (CARDIAC) 20 MG/ML IV SOLN
INTRAVENOUS | Status: DC | PRN
  Administered 2013-10-04: 40 mg via INTRAVENOUS

## 2013-10-04 MED ORDER — METOCLOPRAMIDE HCL 5 MG/ML IJ SOLN
5.0000 mg | Freq: Three times a day (TID) | INTRAMUSCULAR | Status: DC | PRN
  Administered 2013-10-04: 10 mg via INTRAVENOUS
  Filled 2013-10-04: qty 2

## 2013-10-04 SURGICAL SUPPLY — 99 items
ADAPTER BOLT FEMORAL +2/-2 (Knees) ×3 IMPLANT
ANCHOR CORKSCREW BIO 5.5 FT (Anchor) ×3 IMPLANT
AUGMENT FMRL PST PFC SIG SZ4 8 (Orthopedic Implant) ×1 IMPLANT
AUGMENT POSTERIOR PFC (Knees) ×1 IMPLANT
AUGMENT POSTERIOR PFC SZ4 4MM (Knees) ×1 IMPLANT
BANDAGE ESMARK 6X9 LF (GAUZE/BANDAGES/DRESSINGS) ×1 IMPLANT
BLADE SAGITTAL 25.0X1.19X90 (BLADE) ×2 IMPLANT
BLADE SAGITTAL 25.0X1.19X90MM (BLADE) ×1
BLADE SAW SGTL 13.0X1.19X90.0M (BLADE) ×3 IMPLANT
BLADE SAW SGTL 81X20 HD (BLADE) ×3 IMPLANT
BLADE SURG 10 STRL SS (BLADE) ×12 IMPLANT
BNDG ESMARK 6X9 LF (GAUZE/BANDAGES/DRESSINGS) ×3
BOWL SMART MIX CTS (DISPOSABLE) ×6 IMPLANT
BRUSH FEMORAL CANAL (MISCELLANEOUS) ×3 IMPLANT
CEMENT HV SMART SET (Cement) ×9 IMPLANT
CONT SPECI 4OZ STER CLIK (MISCELLANEOUS) ×6 IMPLANT
COTTON STERILE ROLL (GAUZE/BANDAGES/DRESSINGS) ×3 IMPLANT
COVER SURGICAL LIGHT HANDLE (MISCELLANEOUS) ×3 IMPLANT
CUFF TOURNIQUET SINGLE 34IN LL (TOURNIQUET CUFF) ×3 IMPLANT
CUFF TOURNIQUET SINGLE 44IN (TOURNIQUET CUFF) IMPLANT
DRAPE EXTREMITY T 121X128X90 (DRAPE) ×3 IMPLANT
DRAPE INCISE IOBAN 66X45 STRL (DRAPES) ×3 IMPLANT
DRAPE ORTHO SPLIT 77X108 STRL (DRAPES) ×2
DRAPE SURG ORHT 6 SPLT 77X108 (DRAPES) ×1 IMPLANT
DRSG ADAPTIC 3X8 NADH LF (GAUZE/BANDAGES/DRESSINGS) ×3 IMPLANT
DRSG PAD ABDOMINAL 8X10 ST (GAUZE/BANDAGES/DRESSINGS) ×6 IMPLANT
DURAPREP 26ML APPLICATOR (WOUND CARE) ×9 IMPLANT
ELECT REM PT RETURN 9FT ADLT (ELECTROSURGICAL) ×3
ELECTRODE REM PT RTRN 9FT ADLT (ELECTROSURGICAL) ×1 IMPLANT
EVACUATOR 1/8 PVC DRAIN (DRAIN) ×3 IMPLANT
FACESHIELD WRAPAROUND (MASK) ×6 IMPLANT
FEM POST AUG PFC SIGMA SZ4 8MM (Orthopedic Implant) ×3 IMPLANT
FEMORAL ADAPTER (Orthopedic Implant) ×3 IMPLANT
FEMUR SIGMA PS SZ 4.0 L (Knees) ×3 IMPLANT
GAUZE XEROFORM 5X9 LF (GAUZE/BANDAGES/DRESSINGS) ×3 IMPLANT
GLOVE BIOGEL M 6.5 STRL (GLOVE) ×6 IMPLANT
GLOVE BIOGEL PI IND STRL 6.5 (GLOVE) ×3 IMPLANT
GLOVE BIOGEL PI IND STRL 7.5 (GLOVE) ×1 IMPLANT
GLOVE BIOGEL PI IND STRL 8 (GLOVE) ×1 IMPLANT
GLOVE BIOGEL PI IND STRL 8.5 (GLOVE) IMPLANT
GLOVE BIOGEL PI INDICATOR 6.5 (GLOVE) ×6
GLOVE BIOGEL PI INDICATOR 7.5 (GLOVE) ×2
GLOVE BIOGEL PI INDICATOR 8 (GLOVE) ×2
GLOVE BIOGEL PI INDICATOR 8.5 (GLOVE)
GLOVE ECLIPSE 8.0 STRL XLNG CF (GLOVE) ×6 IMPLANT
GLOVE SURG ORTHO 8.5 STRL (GLOVE) ×12 IMPLANT
GLOVE SURG SS PI 6.5 STRL IVOR (GLOVE) ×3 IMPLANT
GOWN STRL REUS W/ TWL LRG LVL3 (GOWN DISPOSABLE) ×4 IMPLANT
GOWN STRL REUS W/ TWL XL LVL3 (GOWN DISPOSABLE) ×1 IMPLANT
GOWN STRL REUS W/TWL 2XL LVL3 (GOWN DISPOSABLE) ×3 IMPLANT
GOWN STRL REUS W/TWL LRG LVL3 (GOWN DISPOSABLE) ×8
GOWN STRL REUS W/TWL XL LVL3 (GOWN DISPOSABLE) ×2
HANDPIECE INTERPULSE COAX TIP (DISPOSABLE) ×2
INSERT TC3 SZ4 4X17.5MM (Knees) ×3 IMPLANT
INSERT TIBAIL RP SZ4 22.5 KNEE (Insert) ×3 IMPLANT
IV NS 1000ML (IV SOLUTION) ×6
IV NS 1000ML BAXH (IV SOLUTION) ×3 IMPLANT
KIT BASIN OR (CUSTOM PROCEDURE TRAY) ×3 IMPLANT
KIT ROOM TURNOVER OR (KITS) ×3 IMPLANT
MANIFOLD NEPTUNE II (INSTRUMENTS) ×3 IMPLANT
NDL SUT 6 .5 CRC .975X.05 MAYO (NEEDLE) ×1 IMPLANT
NEEDLE 22X1 1/2 (OR ONLY) (NEEDLE) ×3 IMPLANT
NEEDLE MAYO TAPER (NEEDLE) ×2
NS IRRIG 1000ML POUR BTL (IV SOLUTION) ×3 IMPLANT
PACK TOTAL JOINT (CUSTOM PROCEDURE TRAY) ×3 IMPLANT
PAD ABD 8X10 STRL (GAUZE/BANDAGES/DRESSINGS) ×9 IMPLANT
PAD ARMBOARD 7.5X6 YLW CONV (MISCELLANEOUS) ×6 IMPLANT
PAD CAST 4YDX4 CTTN HI CHSV (CAST SUPPLIES) ×1 IMPLANT
PADDING CAST ABS 4INX4YD NS (CAST SUPPLIES) ×4
PADDING CAST ABS COTTON 4X4 ST (CAST SUPPLIES) ×2 IMPLANT
PADDING CAST COTTON 4X4 STRL (CAST SUPPLIES) ×2
PATELLA DOME PFC 41MM (Knees) ×3 IMPLANT
PENCIL BUTTON HOLSTER BLD 10FT (ELECTRODE) ×3 IMPLANT
POSTERIOR AUGMENT PFC SZ4 4MM (Knees) ×3 IMPLANT
POSTIER AUGMENT PFC (Knees) ×3 IMPLANT
SET HNDPC FAN SPRY TIP SCT (DISPOSABLE) ×1 IMPLANT
SLEEVE UNIV FEM DIST PRO SZ 31 (Sleeve) ×3 IMPLANT
SPONGE GAUZE 4X4 12PLY (GAUZE/BANDAGES/DRESSINGS) ×3 IMPLANT
SPONGE LAP 18X18 X RAY DECT (DISPOSABLE) ×21 IMPLANT
SPONGE SURGIFOAM ABS GEL SZ50 (HEMOSTASIS) ×3 IMPLANT
STAPLER VISISTAT 35W (STAPLE) ×3 IMPLANT
STEM UNIVERSAL REVISION 75X14 (Stem) ×3 IMPLANT
STEM UNIVERSAL REVISION 75X16 (Stem) ×3 IMPLANT
SUCTION FRAZIER TIP 10 FR DISP (SUCTIONS) ×3 IMPLANT
SUT BONE WAX W31G (SUTURE) ×3 IMPLANT
SUT ETHIBOND NAB CT1 #1 30IN (SUTURE) ×12 IMPLANT
SUT MNCRL AB 3-0 PS2 18 (SUTURE) ×3 IMPLANT
SUT VIC AB 0 CT1 27 (SUTURE) ×2
SUT VIC AB 0 CT1 27XBRD ANBCTR (SUTURE) ×1 IMPLANT
SUT VIC AB 2-0 FS1 27 (SUTURE) ×6 IMPLANT
SWAB COLLECTION DEVICE MRSA (MISCELLANEOUS) ×3 IMPLANT
SYR CONTROL 10ML LL (SYRINGE) ×6 IMPLANT
TOWEL OR 17X24 6PK STRL BLUE (TOWEL DISPOSABLE) ×3 IMPLANT
TOWER CARTRIDGE SMART MIX (DISPOSABLE) ×3 IMPLANT
TRAY FOLEY CATH 16FRSI W/METER (SET/KITS/TRAYS/PACK) ×3 IMPLANT
TRAY REVISION SZ 4 (Knees) ×3 IMPLANT
TRAY SLEEVE CEM ML (Knees) ×3 IMPLANT
TUBE ANAEROBIC SPECIMEN COL (MISCELLANEOUS) ×3 IMPLANT
WATER STERILE IRR 1000ML POUR (IV SOLUTION) IMPLANT

## 2013-10-04 NOTE — Anesthesia Preprocedure Evaluation (Addendum)
Anesthesia Evaluation  Patient identified by MRN, date of birth, ID band Patient awake    Reviewed: Allergy & Precautions, H&P , NPO status , Patient's Chart, lab work & pertinent test results  Airway Mallampati: II  Neck ROM: Full    Dental  (+) Edentulous Upper, Partial Lower, Dental Advisory Given   Pulmonary  breath sounds clear to auscultation        Cardiovascular hypertension, Rhythm:Regular Rate:Normal + Systolic murmurs    Neuro/Psych    GI/Hepatic   Endo/Other    Renal/GU      Musculoskeletal   Abdominal   Peds  Hematology   Anesthesia Other Findings   Reproductive/Obstetrics                          Anesthesia Physical Anesthesia Plan  ASA: III  Anesthesia Plan: General   Post-op Pain Management:    Induction: Intravenous  Airway Management Planned: LMA  Additional Equipment:   Intra-op Plan:   Post-operative Plan:   Informed Consent: I have reviewed the patients History and Physical, chart, labs and discussed the procedure including the risks, benefits and alternatives for the proposed anesthesia with the patient or authorized representative who has indicated his/her understanding and acceptance.   Dental advisory given  Plan Discussed with: CRNA and Anesthesiologist  Anesthesia Plan Comments: (Probable infected L. Total knee replacement Aortic stenosis mean gradient 32 mm hg, normal nuclear stress test  09/09/13 normal EF Hypertension Obesity  Plan GA with adductor canal block and LMA  Kipp Brood, MD )       Anesthesia Quick Evaluation

## 2013-10-04 NOTE — Progress Notes (Signed)
Pt states both eyes feel like "something in them". Painful. Irrigated with sterile NS in each eye but did not feel it improved. Dr. Noreene Larsson notified

## 2013-10-04 NOTE — Progress Notes (Signed)
PT Cancellation Note  Patient Details Name: [name] MRN: [id number] DOB: August 03, 1930   Cancelled Treatment:    Reason Eval/Treat Not Completed: Medical issues which prohibited therapy.  Pt just got lightheaded seated EOB with RN staff and vomited.  PT to check back in AM as staff just positioned her back in supine.   Thanks,    Rollene Rotunda. Circe Chilton, PT, DPT [phone]   10/04/2013, 5:42 PM

## 2013-10-04 NOTE — Anesthesia Postprocedure Evaluation (Signed)
  Anesthesia Post-op Note  Patient: [name]  Procedure(s) Performed: Procedure(s) with comments: TOTAL KNEE REVISION  (Left) - LEFT TOTAL KNEE REVISION   Patient Location: PACU  Anesthesia Type:General and GA combined with regional for post-op pain  Level of Consciousness: awake, alert  and oriented  Airway and Oxygen Therapy: Patient Spontanous Breathing and Patient connected to nasal cannula oxygen  Post-op Pain: mild  Post-op Assessment: Post-op Vital signs reviewed, Patient's Cardiovascular Status Stable, Respiratory Function Stable, Patent Airway and Pain level controlled  Post-op Vital Signs: stable  Last Vitals:  Filed Vitals:   10/04/13 1345  BP: 106/48  Pulse: 81  Temp:   Resp: 17    Complications: No apparent anesthesia complications

## 2013-10-04 NOTE — Anesthesia Procedure Notes (Addendum)
Anesthesia Regional Block:  Adductor canal block  Pre-Anesthetic Checklist: ,, timeout performed, Correct Patient, Correct Site, Correct Laterality, Correct Procedure, Correct Position, site marked, Risks and benefits discussed,  Surgical consent,  Pre-op evaluation,  At surgeon's request and post-op pain management  Laterality: Left and Upper  Prep: chloraprep       Needles:  Injection technique: Single-shot  Needle Type: Echogenic Stimulator Needle     Needle Length: 9cm 9 cm Needle Gauge: 22 and 22 G    Additional Needles:  Procedures: ultrasound guided (picture in chart) Adductor canal block Narrative:  Start time: 10/04/2013 7:15 AM End time: 10/04/2013 7:20 AM Injection made incrementally with aspirations every 5 mL.  Performed by: Personally   Additional Notes: 20 cc 0.5% marcaine 1:200 Epi injected easily      Procedure Name: LMA Insertion Date/Time: 10/04/2013 7:42 AM Performed by: Jefm Miles E Pre-anesthesia Checklist: Patient identified, Emergency Drugs available, Suction available, Patient being monitored and Timeout performed Patient Re-evaluated:Patient Re-evaluated prior to inductionOxygen Delivery Method: Circle system utilized Preoxygenation: Pre-oxygenation with 100% oxygen Intubation Type: IV induction Ventilation: Mask ventilation without difficulty LMA: LMA inserted LMA Size: 5.0 Number of attempts: 1 Placement Confirmation: positive ETCO2 and breath sounds checked- equal and bilateral Tube secured with: Tape Dental Injury: Teeth and Oropharynx as per pre-operative assessment

## 2013-10-04 NOTE — Addendum Note (Signed)
Addendum created 10/04/13 1609 by Kipp Brood, MD   Modules edited: Clinical Notes, Orders   Clinical Notes:  File: [id number]; Pend: [id number]; Pend: [id number]; Pend: [id number]

## 2013-10-04 NOTE — Progress Notes (Signed)
Tobramycin gtts given to Bil eyes per MD order

## 2013-10-04 NOTE — Progress Notes (Signed)
Dr Noreene Larsson at bedside

## 2013-10-04 NOTE — Progress Notes (Signed)
As noted Ms. Conley underwent revision of L. TKR by Dr. Cleophas Dunker. Anesthetic was  general with LMA with pre-op Adductor canal block and was uneventful. Both eyes were taped without ointment placed. In PACU she complains of bilateral eye pain and burning.   Eyes examined, mild conjunctival redness, vision grossly intact.   Impression: Bilateral post-op eye irritation, possible corneal abrasions.   Plan:  1.Ketoralac eye drops Q6 2. Eye patches for comfort 3. Follow-up in AM, if symptoms not improved , opthamology consult.Kipp Brood

## 2013-10-04 NOTE — H&P (Signed)
  The recent History & Physical has been reviewed. I have personally examined the patient today. There is no interval change to the documented History & Physical. The patient would like to proceed with the procedure.  [name] 10/04/2013,  7:17 AM

## 2013-10-04 NOTE — Progress Notes (Signed)
Orthopedic Tech Progress Note Patient Details:  [name] 10-Sep-1930 [id number]  CPM Left Knee CPM Left Knee: On Left Knee Flexion (Degrees): 60 Left Knee Extension (Degrees): 0 Additional Comments: Trapeze bar   Mickie Bail Cammer 10/04/2013, 1:09 PM

## 2013-10-04 NOTE — Progress Notes (Signed)
Utilization review completed.  

## 2013-10-04 NOTE — Addendum Note (Signed)
Addendum created 10/04/13 1444 by Kipp Brood, MD   Modules edited: Orders

## 2013-10-04 NOTE — Plan of Care (Signed)
Problem: Consults Goal: Diagnosis- Total Joint Replacement Revision Total Knee: Left     

## 2013-10-04 NOTE — Op Note (Signed)
PATIENT ID:      [name]  MRN:     [id number] DOB/AGE:    1931-04-23 / 80 y.o.       OPERATIVE REPORT    DATE OF PROCEDURE:  10/04/2013       PREOPERATIVE DIAGNOSIS:   painful LOOSENED LEFT TOTAL KNEE ARTHROPLASTY, POSSIBLE INFECTION                                                       Estimated body mass index is 41.4 kg/(m^2) as calculated from the following:   Height as of 09/26/13: 5\' 1"  (1.549 m).   Weight as of this encounter: 99.338 kg (219 lb).     POSTOPERATIVE DIAGNOSIS:   painful LOOSENED LEFT TOTAL KNEE ARTHROPLASTY without infection                                                                   Estimated body mass index is 41.4 kg/(m^2) as calculated from the following:   Height as of 09/26/13: 5\' 1"  (1.549 m).   Weight as of this encounter: 99.338 kg (219 lb).     PROCEDURE:  Procedure(s):LEFT TOTAL KNEE REVISION     SURGEON:  Norlene Campbell, MD    ASSISTANT:   Jacqualine Code, PA-C   (Present and scrubbed throughout the case, critical for assistance with exposure, retraction, instrumentation, and closure.)          ANESTHESIA: regional and general     DRAINS: (LEFT KNEE) Hemovact drain(s) in the CLAMPED with  Suction Clamped :      TOURNIQUET TIME:  Total Tourniquet Time Documented: Thigh (Left) - 117 minutes Thigh (Left) - 54 minutes Total: Thigh (Left) - 171 minutes     COMPLICATIONS:  None   CONDITION:  stable  PROCEDURE IN DETAIL: [id number]    [name] 10/04/2013, 12:06 PM

## 2013-10-04 NOTE — Transfer of Care (Signed)
Immediate Anesthesia Transfer of Care Note  Patient: [name]  Procedure(s) Performed: Procedure(s) with comments: TOTAL KNEE REVISION  (Left) - LEFT TOTAL KNEE REVISION   Patient Location: PACU  Anesthesia Type:General  Level of Consciousness: lethargic and responds to stimulation  Airway & Oxygen Therapy: Patient Spontanous Breathing and Patient connected to nasal cannula oxygen  Post-op Assessment: Report given to PACU RN  Post vital signs: Reviewed and stable  Complications: No apparent anesthesia complications

## 2013-10-05 LAB — BASIC METABOLIC PANEL
BUN: 19 mg/dL (ref 6–23)
CALCIUM: 8.4 mg/dL (ref 8.4–10.5)
CO2: 26 meq/L (ref 19–32)
Chloride: 101 mEq/L (ref 96–112)
Creatinine, Ser: 0.68 mg/dL (ref 0.50–1.10)
GFR calc Af Amer: >90 mL/min (ref >90)
GFR calc non Af Amer: 80 mL/min — ABNORMAL LOW (ref >90)
Glucose, Bld: 164 mg/dL — ABNORMAL HIGH (ref 70–99)
Potassium: 4.5 mEq/L (ref 3.7–5.3)
Sodium: 138 mEq/L (ref 137–147)

## 2013-10-05 LAB — CBC
HCT: 27.8 % — ABNORMAL LOW (ref 36.0–46.0)
Hemoglobin: 9.1 g/dL — ABNORMAL LOW (ref 12.0–15.0)
MCH: 27.6 pg (ref 26.0–34.0)
MCHC: 32.7 g/dL (ref 30.0–36.0)
MCV: 84.2 fL (ref 78.0–100.0)
Platelets: 145 10*3/uL — ABNORMAL LOW (ref 150–400)
RBC: 3.3 MIL/uL — AB (ref 3.87–5.11)
RDW: 14.4 % (ref 11.5–15.5)
WBC: 11 10*3/uL — ABNORMAL HIGH (ref 4.0–10.5)

## 2013-10-05 MED ORDER — KETOROLAC TROMETHAMINE 15 MG/ML IJ SOLN
INTRAMUSCULAR | Status: AC
  Filled 2013-10-05: qty 1

## 2013-10-05 MED ORDER — ACETAMINOPHEN 500 MG PO TABS
1000.0000 mg | ORAL_TABLET | Freq: Once | ORAL | Status: AC
  Administered 2013-10-05: 1000 mg via ORAL

## 2013-10-05 MED ORDER — ALPRAZOLAM 0.25 MG PO TABS
0.2500 mg | ORAL_TABLET | Freq: Two times a day (BID) | ORAL | Status: DC | PRN
  Administered 2013-10-05 – 2013-10-07 (×3): 0.25 mg via ORAL
  Filled 2013-10-05 (×3): qty 1

## 2013-10-05 NOTE — Plan of Care (Signed)
Problem: Phase II Progression Outcomes Goal: Tolerating diet Outcome: Not Progressing Patient occasionally had nausea throughout the night.

## 2013-10-05 NOTE — Op Note (Signed)
NAMEFLORENCE, [name] NO.:  [id number]  MEDICAL RECORD NO.:  [id number]  LOCATION:  5N03C                        FACILITY:  [facility]  PHYSICIAN:  Claude Manges. Camelia Stelzner, M.D.DATE OF BIRTH:  February 09, 1931  DATE OF PROCEDURE:  10/04/2013 DATE OF DISCHARGE:                              OPERATIVE REPORT   PREOPERATIVE DIAGNOSIS:  Painful, loosened left total knee replacement with possible infection.  POSTOPERATIVE DIAGNOSIS:  Painful, loosened left total knee replacement without infection (minimal white cells per high-powered field).  PROCEDURES:  Revision of left total knee replacement with removal of total knee components and insertion of revision knee replacement.  SURGEON:  Claude Manges. Cleophas Dunker, M.D.  ASSISTANT:  Arlys John D. Petrarca, PA-C was present throughout the operative procedure to ensure its timely completion.  ANESTHESIA:  Left femoral nerve block with general.  COMPLICATIONS:  None.  COMPONENTS:  DePuy MBT revision system, on the tibia I used a #4 MBT tray with a 29 cemented sleeve and a 14 x 75 mm stem.  On the femoral side, I used a #4 left TC3 femur 16 x 75 mm stem and a 31 porous sleeve, a 5-degree adapter, a +2 bolt, a distal 8 mm augment posteromedially and a 4 mm augment posterolaterally.  A 41 patella oval dome and a 22.5 TC3 polyethylene insert.  Components were secured with polymethyl methacrylate with added tobramycin.  DESCRIPTION OF PROCEDURE:  Ms. Birchler was met in the holding area, identified the left knee as the appropriate operative site.  Anesthesia performed of femoral nerve block.  The patient was then transported to room #7 and placed under general anesthesia without difficulty.  Nursing staff inserted a Foley catheter. Urine was clear.  The left lower extremity then placed in a thigh tourniquet.  The leg was prepped with chlorhexidine scrub and then DuraPrep from the tourniquet to the tips of the toes.  Sterile draping was  performed.  With the extremity elevated, it was Esmarch exsanguinated with a proximal tourniquet at 350 mmHg.  The previous midline longitudinal incision was utilized and via sharp dissection carried down to the subcutaneous tissue.  The patient was with abundant subcutaneous adipose tissue.  Technically, the procedure was quite difficult because of the size of the leg.  The incision was carried down through the adipose tissue and the first layer of capsule. Medial parapatellar incision was then made with a knife through the old incision outlined by the Ethibond suture.  The sutures were removed.  The joint was entered.  There was a brown orange effusion, it was probably 40-50 mL in volume.  I obtained anaerobic and anaerobic cultures.  With some difficulty, the patella was everted to 180 degrees laterally and the knee flexed to 90 degrees.  There was abundant grayish synovium consistent with wear reaction.  Synovectomy was performed in all compartments.  I sent a deep synovial biopsy to Pathology for white cells per high-powered field.  There were few if any white cells and no particular inflammatory change, therefore without evidence of infection with a preoperative normal CBC, sed rate, and C-reactive protein, we proceeded with revision knee replacement.  Both the femur and the tibial metallic components were very loose.  I  could easily remove them just by slight pressure using the hammer. There was absolutely no bone loss from removing the components, but there was bone loss to the wear reaction.  I amputated the stem of the polyethylene component, removed it as well.  There was a very small glue mantle.  The groove was carefully removed as well as any synovial tissue about both the femur and the tibia.  I carefully removed the stem portion of the methacrylate from both the femur as well as the tibia and I could visualize the tibial stem without any difficulty and then sounded  it to be sure that I was within bone.  I then copiously irrigated the joint with saline.  We then proceeded with revision of the components.  I hand reamed to a 14 mm tibial diameter and leaving the reamer in place, I then inserted the external tibial guide and made a cleanup cut on the tibia and to the most part, I had bone, there was a central area that was devoid of bone, accordingly we proceeded with using a tibial sleeve.  By hand reaming, we measured a 29 and then reamed the remainder of the canal to fit the 14 x 75 mm stem.  We then assembled a trial tibial tray and made sure that it was aligned using the external tibial guide.  Attention was then placed to the femur, any further methacrylate was removed.  There was considerable erosion of the medial femoral condyle. We measured #4 femur and using the #4 tibial jig, we made an anterior, distal, and posterior cuts.  We required 8 mm augment distally and medially, 8 mm augment medially and posteriorly and 4 mm of posteriorly and laterally.  We then inserted the final jig for the notch.  At that point, the tourniquet had been inflated for 117 minutes.  We released the tourniquet.  There were couple of small arterial bleeders in the posterior aspect of the knee that we Bovie coagulated and then applied Gelfoam and thrombin spray and then covered the wound with sterile 4 x 4 gauze.  The tourniquet was released for a total of 30 minutes during which time we removed the patella and cleaned up the patella and then sized it with a #41.  We then made the 3 holes for the patella button.  We also constructed the trial tibia and femoral components on the back table.  The leg was then elevated, was Esmarch exsanguinated.  We then trialed the femur and the tibia.  We felt we had an excellent fit and excellent alignment.  I trialed a 17.5 mm polyethylene bridging bearing and thought that it was nice and tight that allowed full extension  and well over 100 degrees of flexion.  She did have a preoperative flexion contracture only allowing about 92 degrees of flexion.  At that point, the trial components were removed.  We copiously irrigated the wound with saline solution, and then inserted the final components that had been constructed on the back table using polymethyl methacrylate and 1.2 g of tobramycin per each batch.  Initially inserted the tibial component, the trial polyethylene bridging bearing, and then the femoral component.  These components were impacted.  We removed any extraneous methacrylate from the periphery of the components.  We used a separate batch of methacrylate for the patella button.  At approximately 16 minutes, the methacrylate had hardened in all of the components.  We then re-evaluated the wound.  I thought that the 17.5  polyethylene bearing gave [place] excellent stability, so it was removed and then I inserted the final 17.5 mm bearing and then as I placed the knee through a range of motion, it just seemed a little loose both medially and laterally, so then I trialed a 20 and 22.5 mm bridging bearing and felt that 22 was more stable and then so we eventually used a 22.5 mm polyethylene bridging bearing with full extension, flexion well over 100 degrees, and no instability.  At 54 minutes, the second tourniquet was released.  We had immediate capillary refill to the joint, had some bleeding, and we controlled this with Bovie and with the thrombin spray, but the alignment of the components were nicely aligned and appeared to be perfectly stable.  A Hemovac was inserted and clamped.  Deep capsule was closed with #1 Ethibond.  I did use a single 5.5 mm PEEK Biomet anchor to stabilize the patella tendon with just partial avulsion.  This suture contained #2 FiberWire.  I completed the closure of the deep capsule medially.  The wound was again irrigated with saline solution.  The capsule closed in  several layers with 0 and 2-0 Vicryl and then the subcu with 3-0 Monocryl.  Skin was closed with skin clips.  Sterile bulky dressing was applied by the patient's support stocking.  The patient tolerated the procedure well without complications.     Vonna Kotyk. Durward Fortes, M.D.     PWW/MEDQ  D:  10/04/2013  T:  10/05/2013  Job:  [id number]

## 2013-10-05 NOTE — Evaluation (Signed)
Physical Therapy Evaluation Patient Details Name: [name] MRN: [id number] DOB: 02/08/1931 Today's Date: 10/05/2013   History of Present Illness  80 y.o. female s/p left total knee revision  Clinical Impression  Pt is s/p left TKA resulting in the deficits listed below (see PT Problem List).  Pt will benefit from skilled PT to increase their independence and safety with mobility to allow discharge to the venue listed below.      Follow Up Recommendations SNF;Supervision for mobility/OOB    Equipment Recommendations  None recommended by PT    Recommendations for Other Services       Precautions / Restrictions Precautions Precautions: Knee Precaution Comments: Reviewed precautions Restrictions Weight Bearing Restrictions: Yes LLE Weight Bearing: Partial weight bearing LLE Partial Weight Bearing Percentage or Pounds: 50%      Mobility  Bed Mobility Overal bed mobility: Needs Assistance Bed Mobility: Supine to Sit     Supine to sit: Min assist;+2 for physical assistance;HOB elevated     General bed mobility comments: Min assist +2 for trunk control and assist with LLE. VCs for technique.  Requires extra time.  Transfers Overall transfer level: Needs assistance Equipment used: Rolling walker (2 wheeled) Transfers: Sit to/from Stand Sit to Stand: Min guard;+2 safety/equipment         General transfer comment: Min assist to steady RW. Performed x2 from lowest bed setting. Cues for hand placement. Leans heavily over RW and needs tactile cues to facilitate upright posture  Ambulation/Gait Ambulation/Gait assistance: Min assist;+2 safety/equipment Ambulation Distance (Feet): 6 Feet Assistive device: Rolling walker (2 wheeled) Gait Pattern/deviations: Step-to pattern;Decreased step length - right;Decreased stance time - left;Antalgic   Gait velocity interpretation: Below normal speed for age/gender General Gait Details: Min assist for RW placement and VCs for  safe DME use with max cues for sequencing. Pt required several cues for 50% WB status and has difficulty remembering this.  Stairs            Wheelchair Mobility    Modified Rankin (Stroke Patients Only)       Balance                                             Pertinent Vitals/Pain Pt reports pain as "low" with rest and "high" with movement - no numerical value given Patient repositioned in chair for comfort.     Home Living Family/patient expects to be discharged to:: Skilled nursing facility [facility] place) Living Arrangements: Spouse/significant other Available Help at Discharge: Skilled Nursing Facility Type of Home: House Home Access: Stairs to enter Entrance Stairs-Rails: Right;Left;Can reach both Entrance Stairs-Number of Steps: 2 Home Layout: One level Home Equipment: Walker - 2 wheels;Cane - single point;Bedside commode;Tub bench;Crutches      Prior Function Level of Independence: Independent with assistive device(s)         Comments: Used rolling walker at home for mobility on occasion     Hand Dominance   Dominant Hand: Right    Extremity/Trunk Assessment   Upper Extremity Assessment: Overall WFL for tasks assessed           Lower Extremity Assessment: LLE deficits/detail   LLE Deficits / Details: decreased strength and ROM - significant pain with flexion     Communication   Communication: No difficulties  Cognition Arousal/Alertness: Awake/alert Behavior During Therapy: WFL for tasks assessed/performed Overall Cognitive Status: Within Functional  Limits for tasks assessed                      General Comments      Exercises Total Joint Exercises Ankle Circles/Pumps: AROM;Both;10 reps;Supine Quad Sets: AROM;Left;10 reps;Supine      Assessment/Plan    PT Assessment Patient needs continued PT services  PT Diagnosis Difficulty walking;Abnormality of gait;Acute pain   PT Problem List Decreased  strength;Decreased range of motion;Decreased activity tolerance;Decreased balance;Decreased mobility;Decreased cognition;Decreased knowledge of use of DME;Decreased safety awareness;Decreased knowledge of precautions;Pain  PT Treatment Interventions DME instruction;Gait training;Functional mobility training;Therapeutic activities;Therapeutic exercise;Balance training;Neuromuscular re-education;Cognitive remediation;Patient/family education;Modalities   PT Goals (Current goals can be found in the Care Plan section) Acute Rehab PT Goals Patient Stated Goal: no pain PT Goal Formulation: With patient Time For Goal Achievement: 10/12/13 Potential to Achieve Goals: Good    Frequency 7X/week   Barriers to discharge Decreased caregiver support pt at low level of independence, unsafe for care from husband at home    Co-evaluation               End of Session Equipment Utilized During Treatment: Gait belt Activity Tolerance: Patient tolerated treatment well Patient left: in chair;with call bell/phone within reach Nurse Communication: Mobility status         Time: [id number] PT Time Calculation (min): 33 min   Charges:   PT Evaluation $Initial PT Evaluation Tier I: 1 Procedure PT Treatments $Gait Training: 8-22 mins $Therapeutic Activity: 8-22 mins   PT G Codes:         Charlsie MerlesLogan Secor Rosaleigh Brazzel, [place] CarolinaPT [phone]  [place]erton MountBarbour, Orlandus Borowski S 10/05/2013, 10:46 AM

## 2013-10-05 NOTE — Discharge Instructions (Signed)
Information on my medicine - XARELTO® (Rivaroxaban) ° °This medication education was reviewed with me or my healthcare representative as part of my discharge preparation.  The pharmacist that spoke with me during my hospital stay was:  Loria Lacina P, RPH ° °Why was Xarelto® prescribed for you? °Xarelto® was prescribed for you to reduce the risk of blood clots forming after orthopedic surgery. The medical term for these abnormal blood clots is venous thromboembolism (VTE). ° °What do you need to know about xarelto® ? °Take your Xarelto® ONCE DAILY at the same time every day. °You may take it either with or without food. ° °If you have difficulty swallowing the tablet whole, you may crush it and mix in applesauce just prior to taking your dose. ° °Take Xarelto® exactly as prescribed by your doctor and DO NOT stop taking Xarelto® without talking to the doctor who prescribed the medication.  Stopping without other VTE prevention medication to take the place of Xarelto® may increase your risk of developing a clot. ° °After discharge, you should have regular check-up appointments with your healthcare provider that is prescribing your Xarelto®.   ° °What do you do if you miss a dose? °If you miss a dose, take it as soon as you remember on the same day then continue your regularly scheduled once daily regimen the next day. Do not take two doses of Xarelto® on the same day.  ° °Important Safety Information °A possible side effect of Xarelto® is bleeding. You should call your healthcare provider right away if you experience any of the following: °  Bleeding from an injury or your nose that does not stop. °  Unusual colored urine (red or dark brown) or unusual colored stools (red or black). °  Unusual bruising for unknown reasons. °  A serious fall or if you hit your head (even if there is no bleeding). ° °Some medicines may interact with Xarelto® and might increase your risk of bleeding while on Xarelto®. To help avoid this,  consult your healthcare provider or pharmacist prior to using any new prescription or non-prescription medications, including herbals, vitamins, non-steroidal anti-inflammatory drugs (NSAIDs) and supplements. ° °This website has more information on Xarelto®: www.xarelto.com. ° ° °

## 2013-10-05 NOTE — Plan of Care (Signed)
Problem: Consults Goal: Diagnosis- Total Joint Replacement Outcome: Completed/Met Date Met:  10/05/13 Revision Total Knee

## 2013-10-05 NOTE — Progress Notes (Signed)
OT Cancellation Note  Patient Details Name: [name] MRN: [id number] DOB: 12-10-1930   Cancelled Treatment:    Reason Eval/Treat Not Completed: Other (comment) (Deferring to SNF)  Pilar Grammes 10/05/2013, 1:46 PM

## 2013-10-05 NOTE — Clinical Social Work Psychosocial (Signed)
Clinical Social Work Department  BRIEF PSYCHOSOCIAL ASSESSMENT  Patient:[name]  Account Number: [id number]   Admit date: 10/04/13 Clinical Social Worker Javen Hinderliter Lia Foyer, MSW Date/Time:10/05/2013 10:30 +.Referred by: Physician Date Referred:  Referred for   SNF Placement   Other Referral:  Interview type: Patient at bedside Other interview type: PSYCHOSOCIAL DATA  Living Status: Spouse Admitted from facility:  Level of care:  Primary support name: Emilia Beck Primary support relationship to patient: Husband Degree of support available:  Strong and vested  CURRENT CONCERNS  Current Concerns   Post-Acute Placement   Other Concerns:  SOCIAL WORK ASSESSMENT / PLAN  CSW met with pt at bedside to offer support and discuss discharge disposition. Patient reported that  She pre-registered with [facility]. Patient reports that her husband works and won't be able to provide the care she will need post discharge. CSW contacted [facility] and they have a bed available for the patient.  Patient  thanked CSW for visit and support provided re: PT recommendation for SNF.   Pt lives with her husband  CSW explained placement process and answered questions.   Pt reports [facility]  as her preference    CSW completed FL2 and initiated SNF search.     Assessment/plan status: Information/Referral to [employer]  Other assessment/ plan:  Information/referral to community resources:  SNF   PTAR  PATIENT'S/FAMILY'S RESPONSE TO PLAN OF CARE:  Pt  reports she is agreeable to [facility] in order to increase strength and independence with mobility prior to returning home  Pt verbalized understanding of placement process and appreciation for CSW assist.   Rhea Pink, MSW, [place]

## 2013-10-05 NOTE — Addendum Note (Signed)
Addendum created 10/05/13 8768 by Kipp Brood, MD   Modules edited: Clinical Notes   Clinical Notes:  File: [id number]

## 2013-10-05 NOTE — Progress Notes (Signed)
Anesthesiology Follow-up:  Awake and alert, intermittant nausea last night, tolerating CPM machine well. Eye discomfort resolved, no vision problems.  Kipp Brood, MD

## 2013-10-05 NOTE — Progress Notes (Signed)
Patient ID: [name], female   DOB: November 16, 1930, 80 y.o.   MRN: [id number] PATIENT ID: [name]        MRN:  [id number]          DOB/AGE: March 24, 1931 / 80 y.o.    Heather Campbell, MD   Jacqualine Code, PA-C [address] [place], [place]  [address]                             [phone]   PROGRESS NOTE  Subjective:  negative for Chest Pain  negative for Shortness of Breath  negative for Nausea/Vomiting   negative for Calf Pain    Tolerating Diet: yes-not hungry, but tolerating liquids         Patient reports pain as mild.     Nurses relate pt is restless,otherwise stable  Objective: Vital signs in last 24 hours:   Patient Vitals for the past 24 hrs:  BP Temp Temp src Pulse Resp SpO2  10/05/13 0436 104/41 mmHg 98.1 F (36.7 C) Oral 80 16 96 %  10/05/13 0016 122/44 mmHg 98 F (36.7 C) Oral 71 18 98 %  10/04/13 2000 - - - - 18 -  10/04/13 1951 114/56 mmHg 98.1 F (36.7 C) Oral 76 18 100 %  10/04/13 1600 - - - - 18 -  10/04/13 1445 117/48 mmHg 98.3 F (36.8 C) - 86 16 100 %  10/04/13 1439 - - - 86 16 100 %  10/04/13 1430 95/60 mmHg - - 87 16 100 %  10/04/13 1415 124/57 mmHg - - 86 16 100 %  10/04/13 1400 112/49 mmHg - - 83 20 99 %  10/04/13 1345 106/48 mmHg - - 81 17 99 %  10/04/13 1330 112/46 mmHg - - 76 18 99 %  10/04/13 1315 100/47 mmHg - - 74 13 100 %  10/04/13 1305 98/51 mmHg - - 73 14 97 %  10/04/13 1300 81/41 mmHg - - 69 17 98 %  10/04/13 1255 74/47 mmHg - - 73 19 98 %  10/04/13 1250 - - - 70 18 93 %  10/04/13 1245 80/35 mmHg - - 70 18 90 %  10/04/13 1230 95/49 mmHg - - 74 24 95 %  10/04/13 1225 95/49 mmHg 98.5 F (36.9 C) - 72 25 92 %      Intake/Output from previous day:   06/09 0701 - 06/10 0700 In: 2000 [I.V.:2000] Out: 1700 [Urine:900; Drains:500]   Intake/Output this shift:       Intake/Output     06/09 0701 - 06/10 0700 06/10 0701 - 06/11 0700   I.V. (mL/kg) 2000 (20.1)    Total Intake(mL/kg) 2000 (20.1)    Urine (mL/kg/hr) 900  (0.4)    Drains 500 (0.2)    Blood 300 (0.1)    Total Output 1700     Net +300             LABORATORY DATA:  Recent Labs  10/04/13 1333 10/05/13 0635  WBC  --  11.0*  HGB 10.2* 9.1*  HCT  --  27.8*  PLT  --  145*   No results found for this basename: NA, K, CL, CO2, BUN, CREATININE, GLUCOSE, CALCIUM,  in the last 168 hours Lab Results  Component Value Date   INR 1.15 09/26/2013   INR 1.09 05/22/2011   INR 1.12 09/27/2010    Recent Radiographic Studies :  Chest 2 View  09/26/2013  CLINICAL DATA:  Patient for hip replacement.  Preoperative films.  EXAM: CHEST  2 VIEW  COMPARISON:  PA and lateral chest 10/10/2003.  FINDINGS: The lungs are clear. Heart size is upper normal. There is no pneumothorax or pleural effusion. Mild inferior endplate compression fracture at the thoracolumbar junction is age indeterminate.  IMPRESSION: No acute cardiopulmonary disease.  Mild inferior endplate compression fracture thoracolumbar junction is age indeterminate.   Electronically Signed   By: Drusilla Kannerhomas  Dalessio M.D.   On: 09/26/2013 12:04     Examination:  General appearance: alert, cooperative and no distress  Wound Exam: clean, dry, intact   Drainage:  Moderate amount Serosanguinous exudate in hemovac-125cc's in past 8 hrs  Motor Exam: EHL, FHL, Anterior Tibial and Posterior Tibial Intact  Sensory Exam: Superficial Peroneal, Deep Peroneal and Tibial normal  Vascular Exam: Normal  Assessment:    1 Day Post-Op  Procedure(s) (LRB): TOTAL KNEE REVISION  (Left)  ADDITIONAL DIAGNOSIS:  Active Problems:   S/P total knee replacement using cement  Acute Blood Loss Anemia-will monitor   Plan: Physical Therapy as ordered Partial Weight Bearing @ 50% (PWB)  DVT Prophylaxis:  Xarelto  DISCHARGE PLAN: Skilled Nursing Facility/Rehab-[facility]  DISCHARGE NEEDS: HHPT  Monitor CBC, OOB with PT, D/C foley,         [name], Vernona Peake W 10/05/2013 7:31 AM

## 2013-10-05 NOTE — Progress Notes (Signed)
Patient ID: [name], female   DOB: Apr 13, 1931, 80 y.o.   MRN: [id number] PATIENT ID: Heather LucksRosemarie [name]        MRN:  [id number]          DOB/AGE: Apr 13, 1931 / 80 y.o.    Norlene CampbellPeter Whitfield, MD   Jacqualine CodeBrian Petrarca, PA-C [address] [place], [place]  [id number]                             [phone]   PROGRESS NOTE  Subjective:  negative for Chest Pain  negative for Shortness of Breath  negative for Nausea/Vomiting   negative for Calf Pain    Tolerating Diet: yes         Patient reports pain as mild.     Hungry today.  States minimal pain.  Objective: Vital signs in last 24 hours:   Patient Vitals for the past 24 hrs:  BP Temp Temp src Pulse Resp SpO2  10/05/13 0436 104/41 mmHg 98.1 F (36.7 C) Oral 80 16 96 %  10/05/13 0016 122/44 mmHg 98 F (36.7 C) Oral 71 18 98 %  10/04/13 2000 - - - - 18 -  10/04/13 1951 114/56 mmHg 98.1 F (36.7 C) Oral 76 18 100 %  10/04/13 1600 - - - - 18 -  10/04/13 1445 117/48 mmHg 98.3 F (36.8 C) - 86 16 100 %  10/04/13 1439 - - - 86 16 100 %  10/04/13 1430 95/60 mmHg - - 87 16 100 %  10/04/13 1415 124/57 mmHg - - 86 16 100 %  10/04/13 1400 112/49 mmHg - - 83 20 99 %  10/04/13 1345 106/48 mmHg - - 81 17 99 %  10/04/13 1330 112/46 mmHg - - 76 18 99 %  10/04/13 1315 100/47 mmHg - - 74 13 100 %  10/04/13 1305 98/51 mmHg - - 73 14 97 %  10/04/13 1300 81/41 mmHg - - 69 17 98 %  10/04/13 1255 74/47 mmHg - - 73 19 98 %  10/04/13 1250 - - - 70 18 93 %  10/04/13 1245 80/35 mmHg - - 70 18 90 %  10/04/13 1230 95/49 mmHg - - 74 24 95 %  10/04/13 1225 95/49 mmHg 98.5 F (36.9 C) - 72 25 92 %      Intake/Output from previous day:   06/09 0701 - 06/10 0700 In: 2000 [I.V.:2000] Out: 1700 [Urine:900; Drains:500]   Intake/Output this shift:       Intake/Output     06/09 0701 - 06/10 0700 06/10 0701 - 06/11 0700   I.V. (mL/kg) 2000 (20.1)    Total Intake(mL/kg) 2000 (20.1)    Urine (mL/kg/hr) 900 (0.4)    Drains 500 (0.2)    Blood 300  (0.1)    Total Output 1700     Net +300             LABORATORY DATA:  Recent Labs  10/04/13 1333 10/05/13 0635  WBC  --  11.0*  HGB 10.2* 9.1*  HCT  --  27.8*  PLT  --  145*    Recent Labs  10/05/13 0635  NA 138  K 4.5  CL 101  CO2 26  BUN 19  CREATININE 0.68  GLUCOSE 164*  CALCIUM 8.4   Lab Results  Component Value Date   INR 1.15 09/26/2013   INR 1.09 05/22/2011   INR 1.12 09/27/2010    Recent Radiographic Studies :  Chest 2 View  09/26/2013   CLINICAL DATA:  Patient for hip replacement.  Preoperative films.  EXAM: CHEST  2 VIEW  COMPARISON:  PA and lateral chest 10/10/2003.  FINDINGS: The lungs are clear. Heart size is upper normal. There is no pneumothorax or pleural effusion. Mild inferior endplate compression fracture at the thoracolumbar junction is age indeterminate.  IMPRESSION: No acute cardiopulmonary disease.  Mild inferior endplate compression fracture thoracolumbar junction is age indeterminate.   Electronically Signed   By: Drusilla Kanner M.D.   On: 09/26/2013 12:04     Examination:  General appearance: alert, mild distress and moderate distress Resp: clear to auscultation bilaterally Cardio: regular rate and rhythm and systolic murmur: systolic ejection 3/6, blowing throughout the precordium GI: normal findings: bowel sounds normal  Wound Exam: dressing intact with some bloody drainage   Motor Exam: EHL, FHL, Anterior Tibial and Posterior Tibial Intact  Sensory Exam: Superficial Peroneal, Deep Peroneal and Tibial normal  Vascular Exam: Left posterior tibial artery has 1+ (weak) pulse  Assessment:    1 Day Post-Op  Procedure(s) (LRB): TOTAL KNEE REVISION  (Left)  ADDITIONAL DIAGNOSIS:  Active Problems:   S/P total knee replacement using cement  Acute Blood Loss Anemia asymptomatic at this time   Plan: Physical Therapy as ordered Partial Weight Bearing @ 50% (PWB)  DVT Prophylaxis:  Xarelto, Foot Pumps and TED hose  DISCHARGE PLAN:  Skilled Nursing Facility[facility]Re[facility] on Friday hopefully  DISCHARGE NEEDS: HHPT, CPM, Walker and 3-in-1 comode seat           PETRARCA,BRIAN 10/05/2013 8:08 AM

## 2013-10-06 ENCOUNTER — Encounter (HOSPITAL_COMMUNITY): Payer: Self-pay | Admitting: General Practice

## 2013-10-06 DIAGNOSIS — I359 Nonrheumatic aortic valve disorder, unspecified: Secondary | ICD-10-CM

## 2013-10-06 DIAGNOSIS — D696 Thrombocytopenia, unspecified: Secondary | ICD-10-CM

## 2013-10-06 DIAGNOSIS — T8489XA Other specified complication of internal orthopedic prosthetic devices, implants and grafts, initial encounter: Secondary | ICD-10-CM | POA: Diagnosis not present

## 2013-10-06 DIAGNOSIS — D62 Acute posthemorrhagic anemia: Secondary | ICD-10-CM

## 2013-10-06 DIAGNOSIS — Z96659 Presence of unspecified artificial knee joint: Secondary | ICD-10-CM | POA: Diagnosis not present

## 2013-10-06 DIAGNOSIS — Z6841 Body Mass Index (BMI) 40.0 and over, adult: Secondary | ICD-10-CM | POA: Diagnosis not present

## 2013-10-06 DIAGNOSIS — T84039A Mechanical loosening of unspecified internal prosthetic joint, initial encounter: Secondary | ICD-10-CM | POA: Diagnosis not present

## 2013-10-06 LAB — BASIC METABOLIC PANEL
BUN: 15 mg/dL (ref 6–23)
CALCIUM: 8.3 mg/dL — AB (ref 8.4–10.5)
CO2: 25 mEq/L (ref 19–32)
Chloride: 97 mEq/L (ref 96–112)
Creatinine, Ser: 0.63 mg/dL (ref 0.50–1.10)
GFR calc Af Amer: >90 mL/min (ref >90)
GFR calc non Af Amer: 82 mL/min — ABNORMAL LOW (ref >90)
Glucose, Bld: 121 mg/dL — ABNORMAL HIGH (ref 70–99)
Potassium: 3.5 mEq/L — ABNORMAL LOW (ref 3.7–5.3)
Sodium: 138 mEq/L (ref 137–147)

## 2013-10-06 LAB — CBC
HCT: 25.1 % — ABNORMAL LOW (ref 36.0–46.0)
HEMATOCRIT: 26.7 % — AB (ref 36.0–46.0)
HEMOGLOBIN: 8.9 g/dL — AB (ref 12.0–15.0)
Hemoglobin: 8.3 g/dL — ABNORMAL LOW (ref 12.0–15.0)
MCH: 27.9 pg (ref 26.0–34.0)
MCH: 28 pg (ref 26.0–34.0)
MCHC: 33.1 g/dL (ref 30.0–36.0)
MCHC: 33.3 g/dL (ref 30.0–36.0)
MCV: 84.5 fL (ref 78.0–100.0)
MCV: 84 fL (ref 78.0–100.0)
PLATELETS: 128 10*3/uL — AB (ref 150–400)
Platelets: 123 10*3/uL — ABNORMAL LOW (ref 150–400)
RBC: 2.97 MIL/uL — ABNORMAL LOW (ref 3.87–5.11)
RBC: 3.18 MIL/uL — AB (ref 3.87–5.11)
RDW: 14.5 % (ref 11.5–15.5)
RDW: 14.3 % (ref 11.5–15.5)
WBC: 8 10*3/uL (ref 4.0–10.5)
WBC: 8.5 10*3/uL (ref 4.0–10.5)

## 2013-10-06 LAB — PREPARE RBC (CROSSMATCH)

## 2013-10-06 MED ORDER — FUROSEMIDE 10 MG/ML IJ SOLN
20.0000 mg | Freq: Once | INTRAMUSCULAR | Status: AC
  Administered 2013-10-06: 20 mg via INTRAVENOUS
  Filled 2013-10-06: qty 2

## 2013-10-06 MED ORDER — METHOCARBAMOL 500 MG PO TABS: 500.0000 mg | ORAL_TABLET | Freq: Three times a day (TID) | ORAL | Status: DC | PRN

## 2013-10-06 MED ORDER — RIVAROXABAN 10 MG PO TABS: 10.0000 mg | ORAL_TABLET | ORAL | Status: DC

## 2013-10-06 MED ORDER — OXYCODONE HCL 5 MG PO TABS: 5.0000 mg | ORAL_TABLET | ORAL | Status: DC | PRN

## 2013-10-06 MED ORDER — ACETAMINOPHEN 325 MG PO TABS: 650.0000 mg | ORAL_TABLET | Freq: Four times a day (QID) | ORAL | Status: DC | PRN

## 2013-10-06 MED ORDER — POTASSIUM CHLORIDE CRYS ER 20 MEQ PO TBCR
20.0000 meq | EXTENDED_RELEASE_TABLET | Freq: Once | ORAL | Status: AC
  Administered 2013-10-06: 20 meq via ORAL
  Filled 2013-10-06: qty 1

## 2013-10-06 MED ORDER — ACETAMINOPHEN 325 MG PO TABS
650.0000 mg | ORAL_TABLET | Freq: Once | ORAL | Status: AC
  Administered 2013-10-06: 650 mg via ORAL
  Filled 2013-10-06: qty 2

## 2013-10-06 NOTE — Clinical Social Work Placement (Addendum)
Clinical Social Work Department CLINICAL SOCIAL WORK PLACEMENT NOTE 10/06/2013  Patient:  [name], [name]  Account Number:  [id number] Admit date:  10/04/2013  Clinical Social Worker:  Macario Golds, LCSW  Date/time:  10/06/2013 10:00 AM  Clinical Social Work is seeking post-discharge placement for this patient at the following level of care:   SKILLED NURSING   (*CSW will update this form in Epic as items are completed)   10/05/2013  Patient/family provided with [facility] Department of Clinical Social Work's list of facilities offering this level of care within the geographic area requested by the patient (or if unable, by the patient's family).  10/05/2013  Patient/family informed of their freedom to choose among providers that offer the needed level of care, that participate in Medicare, Medicaid or managed care program needed by the patient, have an available bed and are willing to accept the patient.  10/05/2013  Patient/family informed of [facility]' ownership interest in [facility], as well as of the fact that they are under no obligation to receive care at this facility.  PASARR submitted to EDS on  PASARR number received on   FL2 transmitted to all facilities in geographic area requested by pt/family on  10/05/2013 FL2 transmitted to all facilities within larger geographic area on   Patient informed that his/her managed care company has contracts with or will negotiate with  certain facilities, including the following:     Patient/family informed of bed offers received:  10/05/2013 Patient chooses bed at [facility] Physician recommends and patient chooses bed at    Patient to be transferred to [facility] on  10/07/2013 Patient to be transferred to facility by  [facility] Patient and family notified of transfer on  10/07/2013 Name of family member notified:  Lupita Shutter (Husband)  The following physician request were entered in Epic:   Additional  Comments:

## 2013-10-06 NOTE — Progress Notes (Signed)
Physical Therapy Treatment Patient Details Name: Heather LucksRosemarie Goldberg MRN: [id number] DOB: Sep 06, 1930 Today's Date: 10/06/2013    History of Present Illness 80 y.o. female s/p left total knee revision    PT Comments    Pt progressing slowly towards physical therapy goals, increasing ambulatory distance to 12 feet this AM with min assist to safely maintain 50% weight bearing status. Nurse notified of increased confusion as she is disoriented to place. Will continue to work with pt on improving independence with functional mobility.   Follow Up Recommendations  SNF;Supervision for mobility/OOB     Equipment Recommendations  None recommended by PT    Recommendations for Other Services       Precautions / Restrictions Precautions Precautions: Knee Precaution Comments: Reviewed precautions and weight bearing status Restrictions Weight Bearing Restrictions: Yes LLE Weight Bearing: Partial weight bearing LLE Partial Weight Bearing Percentage or Pounds: 50%    Mobility  Bed Mobility Overal bed mobility: Needs Assistance Bed Mobility: Supine to Sit     Supine to sit: Min assist;HOB elevated     General bed mobility comments: Min assist for LLE support to bring off bed. Verbal cues for technique.  Needs LLE support until she can rest it on the floor secondary to pain.  Transfers Overall transfer level: Needs assistance Equipment used: Rolling walker (2 wheeled) Transfers: Sit to/from Stand Sit to Stand: Min assist         General transfer comment: Min assist to steady RW. Performed from lowest bed setting. Cues for hand placement. Leans heavily over RW and needs tactile cues to facilitate upright posture  Ambulation/Gait Ambulation/Gait assistance: Min assist Ambulation Distance (Feet): 12 Feet Assistive device: Rolling walker (2 wheeled) Gait Pattern/deviations: Step-to pattern;Decreased step length - right;Decreased stance time - left;Antalgic   Gait velocity  interpretation: Below normal speed for age/gender General Gait Details: Min assist for RW control, with max verbal cues for sequencing of gait. VCs to maintain 50% WB status and educated on use of UEs with DME to decrease weight bearing on LLE.   Stairs            Wheelchair Mobility    Modified Rankin (Stroke Patients Only)       Balance   Sitting-balance support: No upper extremity supported;Feet supported Sitting balance-Leahy Scale: Fair     Standing balance support: Bilateral upper extremity supported Standing balance-Leahy Scale: Poor                      Cognition Arousal/Alertness: Awake/alert Behavior During Therapy: WFL for tasks assessed/performed Overall Cognitive Status: Impaired/Different from baseline Area of Impairment: Orientation;Memory;Following commands Orientation Level: Disoriented to;Place   Memory: Decreased recall of precautions;Decreased short-term memory Following Commands: Follows one step commands inconsistently       General Comments: Pt thought she had already left [place]. Has great difficulty following one step commands and sequencing a simple gait pattern/ repeating back instructions. Nurse notified of increased confusion today compared to previous PT session    Exercises Total Joint Exercises Ankle Circles/Pumps: AROM;Both;10 reps;Supine    General Comments General comments (skin integrity, edema, etc.): Pt was pulling on bandage when PT entered room. Hemovac was not in place on left knee. Nurse notified.      Pertinent Vitals/Pain Pt reports she has some pain. No numerical value given Patient repositioned in chair for comfort.     Home Living  Prior Function            PT Goals (current goals can now be found in the care plan section) Acute Rehab PT Goals PT Goal Formulation: With patient Time For Goal Achievement: 10/12/13 Potential to Achieve Goals: Good Progress towards PT  goals: Progressing toward goals    Frequency  7X/week    PT Plan Current plan remains appropriate    Co-evaluation             End of Session Equipment Utilized During Treatment: Gait belt Activity Tolerance: Patient tolerated treatment well Patient left: in chair;with call bell/phone within reach     Time: 1030-1057 PT Time Calculation (min): 27 min  Charges:  $Gait Training: 8-22 mins $Therapeutic Activity: 8-22 mins                    G Codes:      [employer], [place] [phone]  Berton Mount 10/06/2013, 11:34 AM

## 2013-10-06 NOTE — Consult Note (Addendum)
Cardiology Consultation  [name]    [id number] 21-Dec-1930  Reason for Consult:  AS  Requesting Physician: Cordella Register  Primary Cardiologist:  Derl Barrow  HPI: [name] is a very pleasant 80 year old female from [place], [place], who has documented moderately severe aortic stenosis and has been relatively asymptomatic with reference to this.  On 10/04/2013, she underwent left knee surgery with a revision of a previous left total knee replacement with removal of total knee components and insertion of revision knee replacement by Dr. Joni Fears.  She was seen by Dr. Marlou Porch preoperatively and was felt to be  intermediate risk.  An echo Doppler study showed an ejection fraction of 55-60% without regional wall motion abnormalities.  She had grade 2 diastolic dysfunction.  There was severe, diffuse thickening and calcification of the trileaflet aortic valve with a mean gradient of 32 mm and a peak gradient of 48 mm.  Estimated aortic valve area was approximately 0.9 cm.  A preoperative nuclear perfusion study was low risk without ischemia and with normal ejection fraction. She has tolerated her knee surgery without cardiovascular compromise.  She has developed anemia and is now in need for packed red cell transfusions.  We are asked to see her for further evaluation in light of her aortic stenosis. [name], she denies chest pain.  She denies PND, or orthopnea.  She denies presyncope or syncope.  She is unaware of palpitations.   Past Medical History  Diagnosis Date  . Hypertension     on treatment  . Arthritis   . Hepatitis     as a child" Liver infection", yellow jauncice  . History of nephrolithiasis     as child  . Aortic stenosis   . Thrombocytopenia, unspecified   . Benign tumor of lung     removed  . Osteopenia   . Complication of anesthesia     trouble waking up-denies  . Heart murmur   . Venous insufficiency     only varicose veins  . History of kidney  stones   . Cancer     ovarian    Past Surgical History  Procedure Laterality Date  . Joint replacement  [id number]    rt and lft  . Total shoulder arthroplasty  2005    right  . Fracture surgery      rt wrist , left hand  . Vascular surgery  2005    varicose vein   . Total knee arthroplasty      bilateral  . Lung lobectomy  2004    rt upper lobe  . Shoulder hemi-arthroplasty  06/03/2011    Procedure: SHOULDER HEMI-ARTHROPLASTY;  Surgeon: Garald Balding, MD;  Location: [place];  Service: Orthopedics;  Laterality: Left;  Left Hemi Shoulder Hemi-Arthroplasty   . Abdominal hysterectomy      also oohorectomy ?side  . Revision total knee arthroplasty Left 10/04/2013    DR Durward Fortes    FAMHx: Family History  Problem Relation Age of Onset  . Anesthesia problems Neg Hx   . Hypotension Neg Hx   . Malignant hyperthermia Neg Hx   . Pseudochol deficiency Neg Hx     SOCHx:  reports that she has never smoked. She has never used smokeless tobacco. She reports that she drinks about .5 ounces of alcohol per week. She reports that she does not use illicit drugs.  ALLERGIES: Allergies  Allergen Reactions  . Lisinopril Cough  . Micardis [Telmisartan] Other (See Comments)    dizziness  ROS General: Negative; No fevers, chills, or night sweats;  HEENT: Negative; No changes in vision or hearing, sinus congestion, difficulty swallowing Pulmonary: Negative; No cough, wheezing, shortness of breath, hemoptysis Cardiovascular: Positive for aortic stenosis; No chest pain, presyncope, syncope, palpatations GI: Negative; No nausea, vomiting, diarrhea, or abdominal pain GU: Negative; No dysuria, hematuria, or difficulty voiding Musculoskeletal: Positive for revision of her prior left knee replacement.  Mild swelling of the knee. Hematologic/Oncology: Negative; no easy bruising, bleeding Endocrine: Negative; no heat/cold intolerance; no diabetes Neuro: Negative; no changes in balance,  headaches Skin: Negative; No rashes or skin lesions Psychiatric: Negative; No behavioral problems, depression Sleep: Negative; No snoring, daytime sleepiness, hypersomnolence, bruxism, restless legs, hypnogognic hallucinations, no cataplexy Other comprehensive 14 point system review is negative.  HOME MEDICATIONS: Prescriptions prior to admission  Medication Sig Dispense Refill  . amLODipine (NORVASC) 2.5 MG tablet Take 2.5 mg by mouth daily.      Marland Kitchen b complex-C-folic acid 1 MG capsule Take 1 capsule by mouth daily.      . cholecalciferol (VITAMIN D) 1000 UNITS tablet Take 2,000 Units by mouth daily.       Marland Kitchen docusate sodium (COLACE) 50 MG capsule Take 50 mg by mouth 1 day or 1 dose.      . donepezil (ARICEPT ODT) 10 MG disintegrating tablet Take 10 mg by mouth daily.       Marland Kitchen etodolac (LODINE XL) 400 MG 24 hr tablet Take 400 mg by mouth daily.       . hydrochlorothiazide (MICROZIDE) 12.5 MG capsule Take 12.5 mg by mouth daily.       Marland Kitchen HYDROcodone-acetaminophen (NORCO) 7.5-325 MG per tablet Take 1 tablet by mouth every 6 (six) hours as needed for moderate pain.      . Multiple Vitamin (MULITIVITAMIN WITH MINERALS) TABS Take 1 tablet by mouth daily.      . Multiple Vitamins-Minerals (ICAPS AREDS FORMULA PO) Take 1 capsule by mouth daily.      . naproxen sodium (ANAPROX) 220 MG tablet Take 200 mg by mouth as needed.      . Omega-3 Fatty Acids (FISH OIL) 500 MG CAPS Take 500 mg by mouth 1 day or 1 dose.      Marland Kitchen OVER THE COUNTER MEDICATION Cod liver oil  -1 tablespoon daily        HOSPITAL MEDICATIONS: Scheduled: . amLODipine  2.5 mg Oral Daily  . docusate sodium  100 mg Oral BID  . donepezil  10 mg Oral Daily  . hydrochlorothiazide  12.5 mg Oral Daily  . potassium chloride  20 mEq Oral Once  . rivaroxaban  10 mg Oral Q24H     VITALS: Blood pressure 115/58, pulse 91, temperature 98.8 F (37.1 C), temperature source Oral, resp. rate 14, height 5' 1.02" (1.55 m), weight 219 lb (99.338 kg),  SpO2 99.00%.  PHYSICAL EXAM: General appearance: alert, cooperative and no distress. Pt was initially confused and told me that her knee surgery was to be tomorrow.  HEENT: [place]/AT PERRL Neck: no JVD, supple, symmetrical, trachea midline and thyroid not enlarged, symmetric, no tenderness/mass/nodules Lungs: clear to auscultation bilaterally Chest Wall: Nontender to palpation Heart: RRR with rare ectopic complex; 3/6 AS murmur, no s3 Abdomen: soft, non-tender; bowel sounds normal; no masses,  no organomegaly Back: No CVA tenderness Extremities: mild swelling left knee; negative Homan's sign Pulses: 2+ and symmetric Skin: No rashes Neurologic: Grossly normal  LABS: Results for orders placed during the hospital encounter of 10/04/13 (from the  past 48 hour(s))  CBC     Status: Abnormal   Collection Time    10/05/13  6:35 AM      Result Value Ref Range   WBC 11.0 (*) 4.0 - 10.5 K/uL   RBC 3.30 (*) 3.87 - 5.11 MIL/uL   Hemoglobin 9.1 (*) 12.0 - 15.0 g/dL   HCT 27.8 (*) 36.0 - 46.0 %   MCV 84.2  78.0 - 100.0 fL   MCH 27.6  26.0 - 34.0 pg   MCHC 32.7  30.0 - 36.0 g/dL   RDW 14.4  11.5 - 15.5 %   Platelets 145 (*) 150 - 400 K/uL  BASIC METABOLIC PANEL     Status: Abnormal   Collection Time    10/05/13  6:35 AM      Result Value Ref Range   Sodium 138  137 - 147 mEq/L   Potassium 4.5  3.7 - 5.3 mEq/L   Comment: HEMOLYZED SPECIMEN, RESULTS MAY BE AFFECTED   Chloride 101  96 - 112 mEq/L   CO2 26  19 - 32 mEq/L   Glucose, Bld 164 (*) 70 - 99 mg/dL   BUN 19  6 - 23 mg/dL   Creatinine, Ser 0.68  0.50 - 1.10 mg/dL   Calcium 8.4  8.4 - 10.5 mg/dL   GFR calc non Af Amer 80 (*) >90 mL/min   GFR calc Af Amer >90  >90 mL/min   Comment: (NOTE)     The eGFR has been calculated using the CKD EPI equation.     This calculation has not been validated in all clinical situations.     eGFR's persistently <90 mL/min signify possible Chronic Kidney     Disease.  CBC     Status: Abnormal    Collection Time    10/06/13  5:43 AM      Result Value Ref Range   WBC 8.0  4.0 - 10.5 K/uL   RBC 2.97 (*) 3.87 - 5.11 MIL/uL   Hemoglobin 8.3 (*) 12.0 - 15.0 g/dL   HCT 25.1 (*) 36.0 - 46.0 %   MCV 84.5  78.0 - 100.0 fL   MCH 27.9  26.0 - 34.0 pg   MCHC 33.1  30.0 - 36.0 g/dL   RDW 14.5  11.5 - 15.5 %   Platelets 128 (*) 150 - 400 K/uL  BASIC METABOLIC PANEL     Status: Abnormal   Collection Time    10/06/13  5:43 AM      Result Value Ref Range   Sodium 138  137 - 147 mEq/L   Potassium 3.5 (*) 3.7 - 5.3 mEq/L   Chloride 97  96 - 112 mEq/L   CO2 25  19 - 32 mEq/L   Glucose, Bld 121 (*) 70 - 99 mg/dL   BUN 15  6 - 23 mg/dL   Creatinine, Ser 0.63  0.50 - 1.10 mg/dL   Calcium 8.3 (*) 8.4 - 10.5 mg/dL   GFR calc non Af Amer 82 (*) >90 mL/min   GFR calc Af Amer >90  >90 mL/min   Comment: (NOTE)     The eGFR has been calculated using the CKD EPI equation.     This calculation has not been validated in all clinical situations.     eGFR's persistently <90 mL/min signify possible Chronic Kidney     Disease.  PREPARE RBC (CROSSMATCH)     Status: None   Collection Time    10/06/13 11:51 AM  Result Value Ref Range   Order Confirmation ORDER PROCESSED BY BLOOD BANK      IMAGING: No results found.  IMPRESSION:  Patient Active Problem List   Diagnosis Date Noted  . Postoperative anemia due to acute blood loss 10/06/2013  . S/P total knee replacement using cement 10/04/2013  . Thrombocytopenia 04/05/2012  . Osteoarthritis of shoulder 06/04/2011  . Hypertension 06/04/2011  . Aortic stenosis, moderate 06/04/2011  . Obesity, Class II, BMI 35-39.9, with comorbidity 06/04/2011   RECOMMENDATION: [name] has moderately severe aortic valve stenosis and tolerated her left knee replacement with revision from a cardiovascular standpoint.  She does have grade 2 diastolic dysfunction with normal systolic function.  Her postoperative hemoglobin has dropped to 8.3, and she currently is  written to receive 2 units of packed red blood cell transfusion.  I would recommend IV Lasix 20 mg after each packed red cell unit infusion to avoid potential for congestive heart failure and volume overload in light of her aortic stenosis and diastolic dysfunction. If she does not diuresis with this the dose can be changed to 40 mg She is on Xarelto for DVT prophylaxis following her surgery.  On auscultation, she did have an occasional ectopic complex.  I would recommend a 12-lead ECG postoperatively and  telemetry if ectopy continues.  Her potassium is mildly low at 3.5, and she would benefit from KCl supplementation.  I will check a pro BNP in the a.m. in addition to electrolytes and CBC.  We will see again tomorrow.  Attending:  Troy Sine, MD, [facility] 10/06/2013 4:25 PM

## 2013-10-06 NOTE — Clinical Social Work Placement (Deleted)
Clinical Social Work Department CLINICAL SOCIAL WORK PLACEMENT NOTE 10/06/2013  Patient:  [name], [name]  Account Number:  [id number] Admit date:  10/04/2013  Clinical Social Worker:  Macario Golds, LCSW  Date/time:  10/06/2013 10:00 AM  Clinical Social Work is seeking post-discharge placement for this patient at the following level of care:   SKILLED NURSING   (*CSW will update this form in Epic as items are completed)   10/05/2013  Patient/family provided with [facility] Department of Clinical Social Work's list of facilities offering this level of care within the geographic area requested by the patient (or if unable, by the patient's family).  10/05/2013  Patient/family informed of their freedom to choose among providers that offer the needed level of care, that participate in Medicare, Medicaid or managed care program needed by the patient, have an available bed and are willing to accept the patient.  10/05/2013  Patient/family informed of [facility]' ownership interest in [facility], as well as of the fact that they are under no obligation to receive care at this facility.  PASARR submitted to EDS on  PASARR number received on   FL2 transmitted to all facilities in geographic area requested by pt/family on  10/05/2013 FL2 transmitted to all facilities within larger geographic area on   Patient informed that his/her managed care company has contracts with or will negotiate with  certain facilities, including the following:     Patient/family informed of bed offers received:  10/05/2013 Patient chooses bed at [facility] Physician recommends and patient chooses bed at    Patient to be transferred to [facility] on   Patient to be transferred to facility by  Patient and family notified of transfer on  Name of family member notified:    The following physician request were entered in Epic:   Additional Comments:

## 2013-10-06 NOTE — Progress Notes (Addendum)
Patient ID: [name], female   DOB: Aug 01, 1930, 80 y.o.   MRN: [id number] PATIENT ID: [name]        MRN:  [id number]          DOB/AGE: Aug 01, 1930 / 80 y.o.    Norlene CampbellPeter Whitfield, MD   Jacqualine CodeBrian Petrarca, PA-C [address] [place], [place]  [id number]                             [phone]   PROGRESS NOTE  Subjective:  negative for Chest Pain  negative for Shortness of Breath  negative for Nausea/Vomiting   negative for Calf Pain    Tolerating Diet: yes         Patient reports pain as mild and moderate.     Very weak and tired today.  No energy to get up OOB.  Objective: Vital signs in last 24 hours:   Patient Vitals for the past 24 hrs:  BP Temp Temp src Pulse Resp SpO2 Height  10/06/13 1411 134/62 mmHg 99.8 F (37.7 C) Oral 94 12 99 % -  10/06/13 1100 - - - - - - 5' 1.02" (1.55 m)  10/06/13 0539 138/59 mmHg 98.4 F (36.9 C) - 92 16 98 % -  10/06/13 0400 - - - - 18 96 % -  10/06/13 0000 - - - - 18 96 % -  10/05/13 2028 117/45 mmHg 98.5 F (36.9 C) - 89 18 96 % -  10/05/13 1957 - - - - 16 97 % -      Intake/Output from previous day:   06/10 0701 - 06/11 0700 In: 3427.9 [P.O.:1720; I.V.:1707.9] Out: 750 [Urine:500; Drains:250]   Intake/Output this shift:   06/11 0701 - 06/11 1900 In: -  Out: 445 [Urine:420]   Intake/Output     06/10 0701 - 06/11 0700 06/11 0701 - 06/12 0700   P.O. 1720    I.V. (mL/kg) 1707.9 (17.2)    Total Intake(mL/kg) 3427.9 (34.5)    Urine (mL/kg/hr) 500 (0.2) 420 (0.6)   Drains 250 (0.1)    Stool  25 (0)   Blood     Total Output 750 445   Net +2677.9 -445           LABORATORY DATA:  Recent Labs  10/04/13 1333 10/05/13 0635 10/06/13 0543  WBC  --  11.0* 8.0  HGB 10.2* 9.1* 8.3*  HCT  --  27.8* 25.1*  PLT  --  145* 128*    Recent Labs  10/05/13 0635 10/06/13 0543  NA 138 138  K 4.5 3.5*  CL 101 97  CO2 26 25  BUN 19 15  CREATININE 0.68 0.63  GLUCOSE 164* 121*  CALCIUM 8.4 8.3*   Lab Results   Component Value Date   INR 1.15 09/26/2013   INR 1.09 05/22/2011   INR 1.12 09/27/2010    Recent Radiographic Studies :  Chest 2 View  09/26/2013   CLINICAL DATA:  Patient for hip replacement.  Preoperative films.  EXAM: CHEST  2 VIEW  COMPARISON:  PA and lateral chest 10/10/2003.  FINDINGS: The lungs are clear. Heart size is upper normal. There is no pneumothorax or pleural effusion. Mild inferior endplate compression fracture at the thoracolumbar junction is age indeterminate.  IMPRESSION: No acute cardiopulmonary disease.  Mild inferior endplate compression fracture thoracolumbar junction is age indeterminate.   Electronically Signed   By: Drusilla Kannerhomas  Dalessio M.D.   On:  09/26/2013 12:04     Examination:  General appearance: alert, cooperative and moderate distress Resp: clear to auscultation bilaterally Cardio: regular rate and rhythm and systolic murmur: holosystolic 3/6, precardium throughout the precordium GI: normal findings: bowel sounds normal  Wound Exam: clean, dry, intact   Drainage:  None: wound tissue dry  Motor Exam: EHL, FHL, Anterior Tibial and Posterior Tibial Intact  Sensory Exam: Superficial Peroneal, Deep Peroneal and Tibial normal  Vascular Exam: Left posterior tibial artery has 1+ (weak) pulse  Assessment:    2 Days Post-Op  Procedure(s) (LRB): TOTAL KNEE REVISION  (Left)  ADDITIONAL DIAGNOSIS:  Active Problems:   S/P total knee replacement using cement  Acute Blood Loss Anemia symptomatic and Hypokalemia   Plan: Physical Therapy as ordered Partial Weight Bearing @ 50% (PWB)  DVT Prophylaxis:  Xarelto, Foot Pumps and TED hose  DISCHARGE PLAN: Skilled Nursing Facility/Rehab tomorrow  DISCHARGE NEEDS: HHPT, CPM, Walker and 3-in-1 comode seat  Patient is symptomatic with Hgb 8.3.  Extreme fatigue. Will give 2 units PRBC's today to treat symptom but also because of moderate aortic stenosis to prevent added stress cardiac wise.   Recent EKG markedly  different fron April 2015.  Cardiology called and will see       [facility] 10/06/2013 2:15 PM

## 2013-10-06 NOTE — Discharge Summary (Signed)
Heather Campbell, MD   Heather Code, PA-C [address], [place], [place]  [address]                             [phone]  PATIENT ID: [name]        MRN:  [id number]          DOB/AGE: December 02, 1930 / 80 y.o.    DISCHARGE SUMMARY  ADMISSION DATE:    10/04/2013 DISCHARGE DATE:   10/07/2013   ADMISSION DIAGNOSIS: painful LOOSENED LEFT TOTAL KNEE ARTHROPLASTY, POSSIBLE INFECTION    DISCHARGE DIAGNOSIS:  painful LOOSENED LEFT TOTAL KNEE ARTHROPLASTY  ADDITIONAL DIAGNOSIS: Active Problems:   S/P total knee replacement using cement   Postoperative anemia due to acute blood loss  Past Medical History  Diagnosis Date  . Hypertension     on treatment  . Arthritis   . Hepatitis     as a child" Liver infection", yellow jauncice  . History of nephrolithiasis     as child  . Aortic stenosis   . Thrombocytopenia, unspecified   . Benign tumor of lung     removed  . Osteopenia   . Complication of anesthesia     trouble waking up-denies  . Heart murmur   . Venous insufficiency     only varicose veins  . History of kidney stones   . Cancer     ovarian     PROCEDURE: Procedure(s): TOTAL KNEE REVISION  Left on 10/04/2013  CONSULTS: none Treatment Team:  Rounding Lbcardiology, MD   HISTORY: [name] is a very pleasant 80 year old white female who had undergone a total knee replacement in October 2004 by Dr. Erasmo Leventhal. She did have some arthrofibrosis and had a manipulation in January 2005. She was doing well until October 2014 when she started having pain and discomfort. An aspiration was obtained which revealed a normal white count and also had a sed rate of 7 and a CRP of 1.2 on her blood. She had a bone scan on 12/14/2012, and at that time it was felt that she had increased uptake around the left knee replacement. This was in 3 phases considering loosening. She did have a bone density which revealed a T score in the left femoral neck at minus 2.0 in August 2014. She  went to see Dr. Dion Saucier on 07/15/2013 for a 2nd opinion and repeated the bone scan which revealed increased 3-phase activity surrounding the left total knee arthroplasty similar to the prior study, but because of the relative chronicity of the findings it was felt that it was unlikely to reflect an infection but more of a chronic loosening or synovitis   HOSPITAL COURSE:  Munira Haapala is a 80 y.o. admitted on 10/04/2013 and found to have a diagnosis of painful LOOSENED LEFT TOTAL KNEE ARTHROPLASTY, POSSIBLE INFECTION.  After appropriate laboratory studies were obtained  they were taken to the operating room on 10/04/2013 and underwent  Procedure(s): TOTAL KNEE REVISION   Left.   They were given perioperative antibiotics:  Anti-infectives   Start     Dose/Rate Route Frequency Ordered Stop   10/04/13 1700  ceFAZolin (ANCEF) IVPB 2 g/50 mL premix    Comments:  REVISION TKR REASON FOR PROLONGED USE OF ANTIBIOTIC   2 g 100 mL/hr over 30 Minutes Intravenous Every 6 hours 10/04/13 1529 10/06/13 0244   10/04/13 0833  tobramycin (NEBCIN) powder  Status:  Discontinued  As needed 10/04/13 0833 10/04/13 1223   10/04/13 0600  ceFAZolin (ANCEF) IVPB 2 g/50 mL premix     2 g 100 mL/hr over 30 Minutes Intravenous On call to O.R. 10/03/13 1406 10/04/13 1131    .  Tolerated the procedure well.  Placed with a foley intraoperatively.    Toradol was given post op.  POD #1, allowed out of bed to a chair.  PT for ambulation and exercise program.  Foley D/C'd in morning.  IV saline locked.  O2 discontionued. Was extremely fatigued secondary to ABLA with Aortic Stenosis,  Given 2 units PRBC'S  POD #2, continued PT and ambulation.  Hemovac pulled out accidentally in the morning.  Was extremely fatigued secondary to ABLA with Aortic Stenosis,  Given 2 units PRBC'S.  Stat EKG was ordered because of abnormal feeling with shaking which resolved spontaneously.  This occurred after dressing change and placement of heel  on a pillow. EKG was noted to be different from recent EKG. Cardiology consulted.  Lasix given after blood.  POD #3, felt much better post transfusion, sitting up in chair.  Continued PT. Cardiology was concerned about hemoglobin but with repeat it was 9.8.  Spoke to [name] from Cardiology and Dr Tresa EndoKelly allowed her to be discharged with a blood draw this weekend.  [facility]  The remainder of the hospital course was dedicated to ambulation and strengthening.   The patient was discharged on 3 Days Post-Op in  Stable condition.  Blood products given : two units PRBC given  DIAGNOSTIC STUDIES: Recent vital signs:  Patient Vitals for the past 24 hrs:  BP Temp Temp src Pulse Resp SpO2  10/07/13 1300 128/92 mmHg 98.3 F (36.8 C) - 83 16 97 %  10/07/13 0600 135/53 mmHg 98.7 F (37.1 C) Oral 94 16 97 %  10/07/13 0400 - - - - 18 96 %  10/07/13 0000 - - - - 18 96 %  10/06/13 2137 124/54 mmHg 100.4 F (38 C) - 87 18 96 %  10/06/13 2000 - - - - 18 96 %  10/06/13 1900 138/67 mmHg 98.7 F (37.1 C) Oral 89 16 99 %  10/06/13 1842 118/56 mmHg 98.6 F (37 C) Oral 86 16 100 %  10/06/13 1738 110/42 mmHg 98.8 F (37.1 C) Oral 97 18 92 %  10/06/13 1435 115/58 mmHg 98.8 F (37.1 C) Oral 91 14 99 %  10/06/13 1411 134/62 mmHg 99.8 F (37.7 C) Oral 94 12 99 %       Recent laboratory studies:  Recent Labs  10/04/13 1333 10/05/13 0635 10/06/13 0543 10/06/13 2230 10/07/13 0615 10/07/13 1335  WBC  --  11.0* 8.0 8.5 8.1 8.1  HGB 10.2* 9.1* 8.3* 8.9* 9.5* 9.8*  HCT  --  27.8* 25.1* 26.7* 29.2* 29.2*  PLT  --  145* 128* 123* 145* 144*    Recent Labs  10/05/13 0635 10/06/13 0543 10/07/13 0615  NA 138 138 137  K 4.5 3.5* 3.9  CL 101 97 98  CO2 26 25 26   BUN 19 15 12   CREATININE 0.68 0.63 0.63  GLUCOSE 164* 121* 113*  CALCIUM 8.4 8.3* 8.7   Lab Results  Component Value Date   INR 1.15 09/26/2013   INR 1.09 05/22/2011   INR 1.12 09/27/2010     Recent Radiographic  Studies :  Chest 2 View  09/26/2013   CLINICAL DATA:  Patient for hip replacement.  Preoperative films.  EXAM: CHEST  2 VIEW  COMPARISON:  PA and lateral chest 10/10/2003.  FINDINGS: The lungs are clear. Heart size is upper normal. There is no pneumothorax or pleural effusion. Mild inferior endplate compression fracture at the thoracolumbar junction is age indeterminate.  IMPRESSION: No acute cardiopulmonary disease.  Mild inferior endplate compression fracture thoracolumbar junction is age indeterminate.   Electronically Signed   By: Drusilla Kanner M.D.   On: 09/26/2013 12:04    DISCHARGE INSTRUCTIONS:     Discharge Instructions   CPM    Complete by:  As directed   Continuous passive motion machine (CPM):      Use the CPM from 0 to 60 for 6-8 hours per day.      You may increase by 5-10 per day.  You may break it up into 2 or 3 sessions per day.      Use CPM for 3-4 weeks or until you are told to stop.     Call MD / Call 911    Complete by:  As directed   If you experience chest pain or shortness of breath, CALL 911 and be transported to the hospital emergency room.  If you develop a fever above 101 F, pus (white drainage) or increased drainage or redness at the wound, or calf pain, call your surgeon's office.     Change dressing    Complete by:  As directed   Change dressing on SUNDAY, then change the dressing daily with sterile 4 x 4 inch gauze dressing and apply TED hose.  You may clean the incision with alcohol prior to redressing.     Constipation Prevention    Complete by:  As directed   Drink plenty of fluids.  Prune juice and/or coffee may be helpful.  You may use a stool softener, such as Colace (over the counter) 100 mg twice a day.  Use MiraLax (over the counter) for constipation as needed but this may take several days to work.  Mag Citrate --OR-- Milk of Magnesia --OR -- Dulcolax pills/suppositories may also be used but follow directions on the label.     Diet - low sodium heart  healthy    Complete by:  As directed      Diet general    Complete by:  As directed      Discharge instructions    Complete by:  As directed   YOU WERE GIVEN A DEVICE CALLED AN INCENTIVE SPIROMETER TO HELP YOU TAKE DEEP BREATHS.  PLEASE USE THIS AT LEAST TEN (10) TIMES EVERY 1-2 HOURS EVERY DAY TO PREVENT PNEUMONIA.     Do not put a pillow under the knee. Place it under the heel.    Complete by:  As directed      Driving restrictions    Complete by:  As directed   No driving for 6 weeks     Increase activity slowly as tolerated    Complete by:  As directed      Lifting restrictions    Complete by:  As directed   No lifting for 6 weeks     Partial weight bearing    Complete by:  As directed   50 % WEIGHT BEARING AS TAUGHT IN PHYSICAL THERAPY     Patient may shower    Complete by:  As directed   You may shower over the brown dressing.  Once the dressing is removed you may shower without a dressing once there is no drainage.  Do not wash over  the wound.  If drainage remains, cover wound with plastic wrap and then shower.     TED hose    Complete by:  As directed   Use stockings (TED hose) for 2 weeks on operative leg(s).  You may remove them at night for sleeping.           DISCHARGE MEDICATIONS:     Medication List    STOP taking these medications       etodolac 400 MG 24 hr tablet  Commonly known as:  LODINE XL     HYDROcodone-acetaminophen 7.5-325 MG per tablet  Commonly known as:  NORCO     multivitamin with minerals Tabs tablet     naproxen sodium 220 MG tablet  Commonly known as:  ANAPROX      TAKE these medications       acetaminophen 325 MG tablet  Commonly known as:  TYLENOL  Take 2 tablets (650 mg total) by mouth every 6 (six) hours as needed for mild pain (or Fever >/= 101).     amLODipine 2.5 MG tablet  Commonly known as:  NORVASC  Take 2.5 mg by mouth daily.     b complex-C-folic acid 1 MG capsule  Take 1 capsule by mouth daily.      cholecalciferol 1000 UNITS tablet  Commonly known as:  VITAMIN D  Take 2,000 Units by mouth daily.     docusate sodium 50 MG capsule  Commonly known as:  COLACE  Take 50 mg by mouth 1 day or 1 dose.     donepezil 10 MG disintegrating tablet  Commonly known as:  ARICEPT ODT  Take 10 mg by mouth daily.     Fish Oil 500 MG Caps  Take 500 mg by mouth 1 day or 1 dose.     hydrochlorothiazide 12.5 MG capsule  Commonly known as:  MICROZIDE  Take 12.5 mg by mouth daily.     ICAPS AREDS FORMULA PO  Take 1 capsule by mouth daily.     methocarbamol 500 MG tablet  Commonly known as:  ROBAXIN  Take 1 tablet (500 mg total) by mouth every 8 (eight) hours as needed for muscle spasms.     OVER THE COUNTER MEDICATION  Cod liver oil  -1 tablespoon daily     oxyCODONE 5 MG immediate release tablet  Commonly known as:  Oxy IR/ROXICODONE  Take 1-2 tablets (5-10 mg total) by mouth every 4 (four) hours as needed for moderate pain, severe pain or breakthrough pain.     rivaroxaban 10 MG Tabs tablet  Commonly known as:  XARELTO  Take 1 tablet (10 mg total) by mouth daily.        FOLLOW UP VISIT:   Follow-up Information   Follow up with WHITFIELD, Claude Manges, MD. (As scheduled in 2 weeks)    Specialty:  Orthopedic Surgery   Contact information:   [address] [place] [address] [phone]       DISPOSITION:   Skilled Nursing Facility/Rehab  CONDITION:  Stable  CBC to be drawn On Sunday and called to [place].   PETRARCA,BRIAN 10/07/2013, 2:05 PM

## 2013-10-07 ENCOUNTER — Inpatient Hospital Stay (HOSPITAL_COMMUNITY): Payer: Medicare Other

## 2013-10-07 DIAGNOSIS — K5900 Constipation, unspecified: Secondary | ICD-10-CM | POA: Diagnosis not present

## 2013-10-07 DIAGNOSIS — Z96659 Presence of unspecified artificial knee joint: Secondary | ICD-10-CM | POA: Diagnosis not present

## 2013-10-07 DIAGNOSIS — M25569 Pain in unspecified knee: Secondary | ICD-10-CM | POA: Diagnosis not present

## 2013-10-07 DIAGNOSIS — S8990XA Unspecified injury of unspecified lower leg, initial encounter: Secondary | ICD-10-CM | POA: Diagnosis not present

## 2013-10-07 DIAGNOSIS — M159 Polyosteoarthritis, unspecified: Secondary | ICD-10-CM | POA: Diagnosis not present

## 2013-10-07 DIAGNOSIS — E669 Obesity, unspecified: Secondary | ICD-10-CM

## 2013-10-07 DIAGNOSIS — M899 Disorder of bone, unspecified: Secondary | ICD-10-CM | POA: Diagnosis not present

## 2013-10-07 DIAGNOSIS — Z5189 Encounter for other specified aftercare: Secondary | ICD-10-CM | POA: Diagnosis not present

## 2013-10-07 DIAGNOSIS — T84498A Other mechanical complication of other internal orthopedic devices, implants and grafts, initial encounter: Secondary | ICD-10-CM | POA: Diagnosis not present

## 2013-10-07 DIAGNOSIS — D696 Thrombocytopenia, unspecified: Secondary | ICD-10-CM | POA: Diagnosis not present

## 2013-10-07 DIAGNOSIS — Z471 Aftercare following joint replacement surgery: Secondary | ICD-10-CM | POA: Diagnosis not present

## 2013-10-07 DIAGNOSIS — D62 Acute posthemorrhagic anemia: Secondary | ICD-10-CM | POA: Diagnosis not present

## 2013-10-07 DIAGNOSIS — I872 Venous insufficiency (chronic) (peripheral): Secondary | ICD-10-CM | POA: Diagnosis not present

## 2013-10-07 DIAGNOSIS — R7989 Other specified abnormal findings of blood chemistry: Secondary | ICD-10-CM | POA: Diagnosis not present

## 2013-10-07 DIAGNOSIS — M6281 Muscle weakness (generalized): Secondary | ICD-10-CM | POA: Diagnosis not present

## 2013-10-07 DIAGNOSIS — M949 Disorder of cartilage, unspecified: Secondary | ICD-10-CM | POA: Diagnosis not present

## 2013-10-07 DIAGNOSIS — S99919A Unspecified injury of unspecified ankle, initial encounter: Secondary | ICD-10-CM | POA: Diagnosis not present

## 2013-10-07 DIAGNOSIS — I359 Nonrheumatic aortic valve disorder, unspecified: Secondary | ICD-10-CM | POA: Diagnosis not present

## 2013-10-07 DIAGNOSIS — R269 Unspecified abnormalities of gait and mobility: Secondary | ICD-10-CM | POA: Diagnosis not present

## 2013-10-07 DIAGNOSIS — I10 Essential (primary) hypertension: Secondary | ICD-10-CM | POA: Diagnosis not present

## 2013-10-07 DIAGNOSIS — M1710 Unilateral primary osteoarthritis, unspecified knee: Secondary | ICD-10-CM | POA: Diagnosis not present

## 2013-10-07 LAB — CBC
HCT: 29.2 % — ABNORMAL LOW (ref 36.0–46.0)
HEMATOCRIT: 29.2 % — AB (ref 36.0–46.0)
HEMOGLOBIN: 9.8 g/dL — AB (ref 12.0–15.0)
Hemoglobin: 9.5 g/dL — ABNORMAL LOW (ref 12.0–15.0)
MCH: 27.3 pg (ref 26.0–34.0)
MCH: 27.9 pg (ref 26.0–34.0)
MCHC: 32.5 g/dL (ref 30.0–36.0)
MCHC: 33.6 g/dL (ref 30.0–36.0)
MCV: 83.9 fL (ref 78.0–100.0)
MCV: 83.2 fL (ref 78.0–100.0)
Platelets: 145 10*3/uL — ABNORMAL LOW (ref 150–400)
Platelets: 144 10*3/uL — ABNORMAL LOW (ref 150–400)
RBC: 3.48 MIL/uL — AB (ref 3.87–5.11)
RBC: 3.51 MIL/uL — ABNORMAL LOW (ref 3.87–5.11)
RDW: 14.3 % (ref 11.5–15.5)
RDW: 14.2 % (ref 11.5–15.5)
WBC: 8.1 10*3/uL (ref 4.0–10.5)
WBC: 8.1 10*3/uL (ref 4.0–10.5)

## 2013-10-07 LAB — TYPE AND SCREEN
ABO/RH(D): AB POS
Antibody Screen: NEGATIVE
UNIT DIVISION: 0
Unit division: 0

## 2013-10-07 LAB — BASIC METABOLIC PANEL
BUN: 12 mg/dL (ref 6–23)
CHLORIDE: 98 meq/L (ref 96–112)
CO2: 26 meq/L (ref 19–32)
Calcium: 8.7 mg/dL (ref 8.4–10.5)
Creatinine, Ser: 0.63 mg/dL (ref 0.50–1.10)
GFR calc Af Amer: >90 mL/min (ref >90)
GFR, EST NON AFRICAN AMERICAN: 82 mL/min — AB (ref >90)
GLUCOSE: 113 mg/dL — AB (ref 70–99)
POTASSIUM: 3.9 meq/L (ref 3.7–5.3)
Sodium: 137 mEq/L (ref 137–147)

## 2013-10-07 LAB — CULTURE, ROUTINE-ABSCESS: Culture: NO GROWTH

## 2013-10-07 LAB — PRO B NATRIURETIC PEPTIDE: Pro B Natriuretic peptide (BNP): 501.1 pg/mL — ABNORMAL HIGH (ref 0–450)

## 2013-10-07 NOTE — Progress Notes (Signed)
Subjective: No CP, SOB or dizziness  Objective: Vital signs in last 24 hours: Temp:  [98.6 F (37 C)-100.4 F (38 C)] 98.7 F (37.1 C) (06/12 0600) Pulse Rate:  [86-97] 94 (06/12 0600) Resp:  [12-18] 16 (06/12 0600) BP: (110-138)/(42-67) 135/53 mmHg (06/12 0600) SpO2:  [92 %-100 %] 97 % (06/12 0600) Last BM Date: 10/03/13  Intake/Output from previous day: 06/11 0701 - 06/12 0700 In: 1187.5 [P.O.:840; Blood:347.5] Out: 795 [Urine:770; Stool:25] Intake/Output this shift:    Medications Current Facility-Administered Medications  Medication Dose Route Frequency Provider Last Rate Last Dose  . acetaminophen (TYLENOL) tablet 650 mg  650 mg Oral Q6H PRN Valeria Batman, MD   650 mg at 10/05/13 2300   Or  . acetaminophen (TYLENOL) suppository 650 mg  650 mg Rectal Q6H PRN Valeria Batman, MD      . ALPRAZolam Prudy Feeler) tablet 0.25 mg  0.25 mg Oral BID PRN Valeria Batman, MD   0.25 mg at 10/07/13 0017  . alum & mag hydroxide-simeth (MAALOX/MYLANTA) 200-200-20 MG/5ML suspension 30 mL  30 mL Oral Q4H PRN Valeria Batman, MD      . amLODipine [facility]) tablet 2.5 mg  2.5 mg Oral Daily Valeria Batman, MD   2.5 mg at 10/07/13 0942  . bisacodyl (DULCOLAX) suppository 10 mg  10 mg Rectal Daily PRN Valeria Batman, MD      . docusate sodium (COLACE) capsule 100 mg  100 mg Oral BID Valeria Batman, MD   100 mg at 10/07/13 0942  . donepezil (ARICEPT) tablet 10 mg  10 mg Oral Daily Valeria Batman, MD   10 mg at 10/07/13 0942  . hydrochlorothiazide (MICROZIDE) capsule 12.5 mg  12.5 mg Oral Daily Valeria Batman, MD   12.5 mg at 10/07/13 0942  . HYDROmorphone (DILAUDID) injection 0.5 mg  0.5 mg Intravenous Q2H PRN Valeria Batman, MD   0.5 mg at 10/05/13 2300  . magnesium hydroxide (MILK OF MAGNESIA) suspension 30 mL  30 mL Oral Daily PRN Valeria Batman, MD      . menthol-cetylpyridinium (CEPACOL) lozenge 3 mg  1 lozenge Oral PRN Valeria Batman, MD       Or  . phenol  [facility]) mouth spray 1 spray  1 spray Mouth/Throat PRN Valeria Batman, MD      . methocarbamol (ROBAXIN) tablet 500 mg  500 mg Oral Q6H PRN Valeria Batman, MD   500 mg at 10/06/13 1301   Or  . methocarbamol (ROBAXIN) 500 mg in dextrose 5 % 50 mL IVPB  500 mg Intravenous Q6H PRN Valeria Batman, MD      . metoCLOPramide [facility]) tablet 5-10 mg  5-10 mg Oral Q8H PRN Valeria Batman, MD       Or  . metoCLOPramide [facility]) injection 5-10 mg  5-10 mg Intravenous Q8H PRN Valeria Batman, MD   10 mg at 10/04/13 2212  . ondansetron (ZOFRAN) tablet 4 mg  4 mg Oral Q6H PRN Valeria Batman, MD       Or  . ondansetron [facility]) injection 4 mg  4 mg Intravenous Q6H PRN Valeria Batman, MD   4 mg at 10/04/13 1715  . oxyCODONE (Oxy IR/ROXICODONE) immediate release tablet 5-10 mg  5-10 mg Oral Q3H PRN Valeria Batman, MD   10 mg at 10/07/13 0017  . rivaroxaban (XARELTO) tablet 10 mg  10 mg Oral Q24H Valeria Batman, MD  10 mg at 10/07/13 [id number]    PE: General appearance: alert, cooperative and no distress Lungs: clear to auscultation bilaterally Heart: regular rate and rhythm, S1, S2 normal, 2/6 sys MM LSB Extremities: No LEE Pulses: 2+ and symmetric Skin: Warm and dry Neurologic: Grossly normal  Lab Results:   Recent Labs  10/06/13 0543 10/06/13 2230 10/07/13 0615  WBC 8.0 8.5 8.1  HGB 8.3* 8.9* 9.5*  HCT 25.1* 26.7* 29.2*  PLT 128* 123* 145*   BMET  Recent Labs  10/05/13 0635 10/06/13 0543 10/07/13 0615  NA 138 138 137  K 4.5 3.5* 3.9  CL 101 97 98  CO2 26 25 26   GLUCOSE 164* 121* 113*  BUN 19 15 12   CREATININE 0.68 0.63 0.63  CALCIUM 8.4 8.3* 8.7   Assessment/Plan   Active Problems:   S/P total knee replacement using cement   Postoperative anemia due to acute blood loss   Aortic stenosis, mod to severe   Hypokalemia  Plan:  Hgb has improved to 9.5.  Not a very significant increase for 2 units of PRBCs.   Recheck in the AM.   BNP 501.  No signs of  CHF.  BP and HR stable.  Potassium now WNL.  Follow up with Dr. Anne FuSkains.     LOS: 3 days    HAGER, BRYAN PA-C 10/07/2013 11:50 AM  Patient seen and examined. Agree with assessment and plan. Tolerated PRBC transfusions with Hb increased to 9.5. Not the expected increase considering she received 2 units of PRBC. No signs of CHF on exam. Pt remains asymptomatic with reference to her AS. Would re-check cbc in am.   Lennette Biharihomas A. Nancie Bocanegra, MD, [facility] CenterFACC 10/07/2013 12:23 PM

## 2013-10-07 NOTE — Progress Notes (Signed)
Report called to [facility], spoke with RN Candise Che. Patient will be transported to [facility] by non-emergent transport.

## 2013-10-07 NOTE — Progress Notes (Signed)
Physical Therapy Treatment Patient Details Name: [name] MRN: [id number] DOB: 06/06/1930 Today's Date: 10/07/2013    History of Present Illness 80 y.o. female s/p left total knee revision    PT Comments    Patient progressing more today with ambulation. Still showing some inconsistencies with STM and follow through. Continue to recommend SNF for ongoing Physical Therapy.      Follow Up Recommendations  SNF;Supervision for mobility/OOB     Equipment Recommendations  None recommended by PT    Recommendations for Other Services       Precautions / Restrictions Precautions Precautions: Knee Precaution Comments: Reviewed precautions and weight bearing status Restrictions Weight Bearing Restrictions: Yes LLE Weight Bearing: Partial weight bearing LLE Partial Weight Bearing Percentage or Pounds: 50%    Mobility  Bed Mobility Overal bed mobility: Needs Assistance       Supine to sit: Min assist;HOB elevated     General bed mobility comments: Min assist for LLE support to bring off bed. Verbal cues for technique  Transfers Overall transfer level: Needs assistance Equipment used: Rolling walker (2 wheeled)   Sit to Stand: Mod assist         General transfer comment: Mod A to stand from bed this session. Cues for safe hand placement and not to lean over RW for support  Ambulation/Gait Ambulation/Gait assistance: Min guard Ambulation Distance (Feet): 45 Feet Assistive device: Rolling walker (2 wheeled) Gait Pattern/deviations: Step-to pattern;Decreased step length - right;Decreased step length - left Gait velocity: decreased   General Gait Details: Minguard for safety. Cues for gait sequence and posture.    Stairs            Wheelchair Mobility    Modified Rankin (Stroke Patients Only)       Balance                                    Cognition Arousal/Alertness: Awake/alert Behavior During Therapy: WFL for tasks  assessed/performed Overall Cognitive Status: Impaired/Different from baseline Area of Impairment: Following commands;Memory Orientation Level: Disoriented to;Place   Memory: Decreased short-term memory              Exercises Total Joint Exercises Quad Sets: AROM;Left;10 reps;Supine Heel Slides: AAROM;Left;10 reps Hip ABduction/ADduction: AAROM;Left;10 reps Straight Leg Raises: AAROM;Left;10 reps    General Comments        Pertinent Vitals/Pain 6/10 L knee pain patient repositioned for comfort     Home Living                      Prior Function            PT Goals (current goals can now be found in the care plan section) Progress towards PT goals: Progressing toward goals    Frequency  7X/week    PT Plan Current plan remains appropriate    Co-evaluation             End of Session Equipment Utilized During Treatment: Gait belt Activity Tolerance: Patient tolerated treatment well Patient left: in chair;with call bell/phone within reach     Time: 1047-1111 PT Time Calculation (min): 24 min  Charges:  $Gait Training: 8-22 mins $Therapeutic Exercise: 8-22 mins                    G Codes:      Fredrich Birks 10/07/2013, 12:01 PM 10/07/2013 Mckinley Jewel  [place] pager [phone] office

## 2013-10-07 NOTE — Progress Notes (Addendum)
Patient ID: [name], female   DOB: 1930-10-02, 80 y.o.   MRN: [id number] PATIENT ID: [name]        MRN:  [id number]          DOB/AGE: 1930-10-02 / 80 y.o.    Norlene CampbellPeter Whitfield, MD   Jacqualine CodeBrian Petrarca, PA-C [address] [place], [place]  [id number]                             [phone]   PROGRESS NOTE  Subjective:  negative for Chest Pain  negative for Shortness of Breath  negative for Nausea/Vomiting   negative for Calf Pain    Tolerating Diet: yes         Patient reports pain as mild and moderate.     Says she is much improved.  Feels better.  Sitting in chair.  Objective: Vital signs in last 24 hours:   Patient Vitals for the past 24 hrs:  BP Temp Temp src Pulse Resp SpO2 Height  10/07/13 0600 135/53 mmHg 98.7 F (37.1 C) Oral 94 16 97 % -  10/07/13 0400 - - - - 18 96 % -  10/07/13 0000 - - - - 18 96 % -  10/06/13 2137 124/54 mmHg 100.4 F (38 C) - 87 18 96 % -  10/06/13 2000 - - - - 18 96 % -  10/06/13 1900 138/67 mmHg 98.7 F (37.1 C) Oral 89 16 99 % -  10/06/13 1842 118/56 mmHg 98.6 F (37 C) Oral 86 16 100 % -  10/06/13 1738 110/42 mmHg 98.8 F (37.1 C) Oral 97 18 92 % -  10/06/13 1435 115/58 mmHg 98.8 F (37.1 C) Oral 91 14 99 % -  10/06/13 1411 134/62 mmHg 99.8 F (37.7 C) Oral 94 12 99 % -  10/06/13 1100 - - - - - - 5' 1.02" (1.55 m)      Intake/Output from previous day:   06/11 0701 - 06/12 0700 In: 947.5 [P.O.:600] Out: 795 [Urine:770]   Intake/Output this shift:       Intake/Output     06/11 0701 - 06/12 0700 06/12 0701 - 06/13 0700   P.O. 600    I.V. (mL/kg)     Blood 347.5    Total Intake(mL/kg) 947.5 (9.5)    Urine (mL/kg/hr) 770 (0.3)    Drains     Stool 25 (0)    Total Output 795     Net +152.5          Urine Occurrence 4 x       LABORATORY DATA:  Recent Labs  10/04/13 1333 10/05/13 0635 10/06/13 0543 10/06/13 2230 10/07/13 0615  WBC  --  11.0* 8.0 8.5 8.1  HGB 10.2* 9.1* 8.3* 8.9* 9.5*  HCT  --  27.8*  25.1* 26.7* 29.2*  PLT  --  145* 128* 123* 145*    Recent Labs  10/05/13 0635 10/06/13 0543 10/07/13 0615  NA 138 138 137  K 4.5 3.5* 3.9  CL 101 97 98  CO2 26 25 26   BUN 19 15 12   CREATININE 0.68 0.63 0.63  GLUCOSE 164* 121* 113*  CALCIUM 8.4 8.3* 8.7   Lab Results  Component Value Date   INR 1.15 09/26/2013   INR 1.09 05/22/2011   INR 1.12 09/27/2010    Recent Radiographic Studies :  Chest 2 View  09/26/2013   CLINICAL DATA:  Patient for hip replacement.  Preoperative films.  EXAM: CHEST  2 VIEW  COMPARISON:  PA and lateral chest 10/10/2003.  FINDINGS: The lungs are clear. Heart size is upper normal. There is no pneumothorax or pleural effusion. Mild inferior endplate compression fracture at the thoracolumbar junction is age indeterminate.  IMPRESSION: No acute cardiopulmonary disease.  Mild inferior endplate compression fracture thoracolumbar junction is age indeterminate.   Electronically Signed   By: Drusilla Kanner M.D.   On: 09/26/2013 12:04     Examination:  General appearance: alert, cooperative, mild distress and moderate distress Resp: clear to auscultation bilaterally Cardio: regular rate and rhythm GI: normal findings: bowel sounds normal  Wound Exam: clean, dry, intact   Drainage:  None: wound tissue dry  Motor Exam: EHL, FHL, Anterior Tibial and Posterior Tibial Intact  Sensory Exam: Superficial Peroneal, Deep Peroneal and Tibial normal  Vascular Exam: Left posterior tibial artery has 1+ (weak) pulse  Assessment:    3 Days Post-Op  Procedure(s) (LRB): TOTAL KNEE REVISION  (Left)  ADDITIONAL DIAGNOSIS:  Active Problems:   S/P total knee replacement using cement   Postoperative anemia due to acute blood loss  Acute Blood Loss Anemia improved and less symptomatic and Hypokalemia improved and normal   Plan: Physical Therapy as ordered Partial Weight Bearing @ 50% (PWB)  DVT Prophylaxis:  Xarelto, Foot Pumps and TED hose  DISCHARGE PLAN: Skilled  Nursing Facility/Rehab [facility] once cleared by Cardiology  DISCHARGE NEEDS: HHPT, CPM, Walker and 3-in-1 comode seat once discharged from SNF  Obtaining left knee x-ray to check prosthesis.   Reviewed X-rays,  Excellent position and alignment    [facility] 10/07/2013 8:06 AM

## 2013-10-07 NOTE — Clinical Social Work Note (Signed)
Clinical Social Worker facilitated patient discharge including contacting patient family and facility to confirm patient discharge plans.  Clinical information faxed to facility and family agreeable with plan.  CSW arranged ambulance transport via [facility] to [facility].  RN to call report prior to discharge.  Clinical Social Worker will sign off for now as social work intervention is no longer needed. Please consult us again if new need arises.  Jesse Ruqayyah Lute, LCSW [phone] 

## 2013-10-09 ENCOUNTER — Other Ambulatory Visit: Payer: Self-pay

## 2013-10-09 LAB — ANAEROBIC CULTURE

## 2013-10-09 LAB — HEMOGLOBIN: HGB: 9.4 g/dL — AB (ref 12.0–16.0)

## 2013-10-10 ENCOUNTER — Encounter (HOSPITAL_COMMUNITY): Payer: Self-pay | Admitting: Orthopaedic Surgery

## 2013-10-10 ENCOUNTER — Non-Acute Institutional Stay (SKILLED_NURSING_FACILITY): Payer: Medicare Other | Admitting: Adult Health

## 2013-10-10 DIAGNOSIS — I10 Essential (primary) hypertension: Secondary | ICD-10-CM | POA: Diagnosis not present

## 2013-10-10 DIAGNOSIS — Z96659 Presence of unspecified artificial knee joint: Secondary | ICD-10-CM | POA: Diagnosis not present

## 2013-10-10 DIAGNOSIS — M25569 Pain in unspecified knee: Secondary | ICD-10-CM | POA: Diagnosis not present

## 2013-10-10 DIAGNOSIS — D62 Acute posthemorrhagic anemia: Secondary | ICD-10-CM

## 2013-10-10 DIAGNOSIS — K5900 Constipation, unspecified: Secondary | ICD-10-CM

## 2013-10-10 DIAGNOSIS — Z96652 Presence of left artificial knee joint: Secondary | ICD-10-CM

## 2013-10-10 DIAGNOSIS — F0390 Unspecified dementia without behavioral disturbance: Secondary | ICD-10-CM

## 2013-10-10 DIAGNOSIS — M25562 Pain in left knee: Secondary | ICD-10-CM

## 2013-10-11 ENCOUNTER — Other Ambulatory Visit: Payer: Self-pay | Admitting: *Deleted

## 2013-10-11 ENCOUNTER — Non-Acute Institutional Stay (SKILLED_NURSING_FACILITY): Payer: Medicare Other | Admitting: Internal Medicine

## 2013-10-11 DIAGNOSIS — K5900 Constipation, unspecified: Secondary | ICD-10-CM | POA: Diagnosis not present

## 2013-10-11 DIAGNOSIS — I10 Essential (primary) hypertension: Secondary | ICD-10-CM

## 2013-10-11 DIAGNOSIS — M25562 Pain in left knee: Secondary | ICD-10-CM

## 2013-10-11 DIAGNOSIS — M25569 Pain in unspecified knee: Secondary | ICD-10-CM

## 2013-10-11 DIAGNOSIS — D62 Acute posthemorrhagic anemia: Secondary | ICD-10-CM

## 2013-10-11 MED ORDER — OXYCODONE HCL 15 MG PO TABS: ORAL_TABLET | ORAL | Status: DC

## 2013-10-11 NOTE — Telephone Encounter (Signed)
[facility] 

## 2013-10-12 DIAGNOSIS — M25562 Pain in left knee: Secondary | ICD-10-CM | POA: Insufficient documentation

## 2013-10-12 DIAGNOSIS — K5900 Constipation, unspecified: Secondary | ICD-10-CM | POA: Insufficient documentation

## 2013-10-12 NOTE — Progress Notes (Signed)
HISTORY & PHYSICAL  DATE: 10/11/2013   FACILITY: [facility]  LEVEL OF CARE: SNF (31)  ALLERGIES:  Allergies  Allergen Reactions  . Lisinopril Cough  . Micardis [Telmisartan] Other (See Comments)    dizziness    CHIEF COMPLAINT:  Manage left knee pain, acute blood loss anemia and hypertension  HISTORY OF PRESENT ILLNESS: Patient is an 80 year old Caucasian ([place]) female.  LEFT KNEE PAIN: Patient has a history of left total knee arthroplasty. Starting in October of 2014 she started having pain and discomfort and was diagnosed with painful loosened left total knee arthroplasty. She underwent left total knee revision and tolerated the procedure well. She is admitted to this facility for short-term rehabilitation. She denies left knee pain.  ANEMIA: The anemia has been stable. The patient denies fatigue, melena or hematochezia. No complications from the medications currently being used. Postoperatively the patient suffered an acute blood loss anemia. She received 2 units of packed red blood cells.  HTN: Pt 's HTN remains stable.  Denies CP, sob, DOE, headaches, dizziness or visual disturbances. Complains of lower extremity swelling.  No complications from the medications currently being used.  Last BP : 120/62.  PAST MEDICAL HISTORY :  Past Medical History  Diagnosis Date  . Hypertension     on treatment  . Arthritis   . Hepatitis     as a child" Liver infection", yellow jauncice  . History of nephrolithiasis     as child  . Aortic stenosis   . Thrombocytopenia, unspecified   . Benign tumor of lung     removed  . Osteopenia   . Complication of anesthesia     trouble waking up-denies  . Heart murmur   . Venous insufficiency     only varicose veins  . History of kidney stones   . Cancer     ovarian     PAST SURGICAL HISTORY: Past Surgical History  Procedure Laterality Date  . Joint replacement  [id number]    rt and lft  . Total  shoulder arthroplasty  2005    right  . Fracture surgery      rt wrist , left hand  . Vascular surgery  2005    varicose vein   . Total knee arthroplasty      bilateral  . Lung lobectomy  2004    rt upper lobe  . Shoulder hemi-arthroplasty  06/03/2011    Procedure: SHOULDER HEMI-ARTHROPLASTY;  Surgeon: Valeria Batman, MD;  Location: [facility];  Service: Orthopedics;  Laterality: Left;  Left Hemi Shoulder Hemi-Arthroplasty   . Abdominal hysterectomy      also oohorectomy ?side  . Revision total knee arthroplasty Left 10/04/2013    DR Cleophas Dunker  . Total knee revision Left 10/04/2013    Procedure: TOTAL KNEE REVISION ;  Surgeon: Valeria Batman, MD;  Location: [facility] OR;  Service: Orthopedics;  Laterality: Left;  LEFT TOTAL KNEE REVISION     SOCIAL HISTORY:  reports that she has never smoked. She has never used smokeless tobacco. She reports that she drinks about .5 ounces of alcohol per week. She reports that she does not use illicit drugs.  FAMILY HISTORY:  Family History  Problem Relation Age of Onset  . Anesthesia problems Neg Hx   . Hypotension Neg Hx   . Malignant hyperthermia Neg Hx   . Pseudochol deficiency Neg Hx     CURRENT MEDICATIONS: Reviewed per MAR/see medication list  REVIEW OF SYSTEMS:  See HPI otherwise 14 point ROS is negative.  PHYSICAL EXAMINATION  VS:  See VS section  GENERAL: no acute distress, moderately obese body habitus EYES: conjunctivae normal, sclerae normal, normal eye lids MOUTH/THROAT: lips without lesions,no lesions in the mouth,tongue is without lesions,uvula elevates in midline NECK: supple, trachea midline, no neck masses, no thyroid tenderness, no thyromegaly LYMPHATICS: no LAN in the neck, no supraclavicular LAN RESPIRATORY: breathing is even & unlabored, BS CTAB CARDIAC: RRR, no murmur,no extra heart sounds, +2 bilateral lower extremity edema GI:  ABDOMEN: abdomen soft, normal BS, no masses, no tenderness  LIVER/SPLEEN: no hepatomegaly,  no splenomegaly MUSCULOSKELETAL: HEAD: normal to inspection  EXTREMITIES: LEFT UPPER EXTREMITY: full range of motion, normal strength & tone RIGHT UPPER EXTREMITY:  full range of motion, normal strength & tone LEFT LOWER EXTREMITY:  range of motion not tested due to surgery, normal strength & tone RIGHT LOWER EXTREMITY:  full range of motion, normal strength & tone PSYCHIATRIC: the patient is alert & oriented to person, affect & behavior appropriate  LABS/RADIOLOGY:  Labs reviewed: Basic Metabolic Panel:  Recent Labs  16/01/9605/10/15 0635 10/06/13 0543 10/07/13 0615  NA 138 138 137  K 4.5 3.5* 3.9  CL 101 97 98  CO2 26 25 26   GLUCOSE 164* 121* 113*  BUN 19 15 12   CREATININE 0.68 0.63 0.63  CALCIUM 8.4 8.3* 8.7   Liver Function Tests:  Recent Labs  09/26/13 1047  AST 21  ALT 12  ALKPHOS 102  BILITOT 0.4  PROT 7.4  ALBUMIN 4.0   CBC:  Recent Labs  09/26/13 1047  10/06/13 2230 10/07/13 0615 10/07/13 1335  WBC 5.2  < > 8.5 8.1 8.1  NEUTROABS 3.2  --   --   --   --   HGB 12.9  < > 8.9* 9.5* 9.8*  HCT 38.1  < > 26.7* 29.2* 29.2*  MCV 84.1  < > 84.0 83.9 83.2  PLT 170  < > 123* 145* 144*  < > = values in this interval not displayed.   CHEST  2 VIEW   COMPARISON:  PA and lateral chest 10/10/2003.   FINDINGS: The lungs are clear. Heart size is upper normal. There is no pneumothorax or pleural effusion. Mild inferior endplate compression fracture at the thoracolumbar junction is age indeterminate.   IMPRESSION: No acute cardiopulmonary disease.   Mild inferior endplate compression fracture thoracolumbar junction is age indeterminate. LEFT KNEE - 1-2 VIEW   COMPARISON:  None.   FINDINGS: There is evidence of patient's total knee arthroplasty with prosthetic components in adequate position and intact. There is no acute fracture or dislocation. There is no significant joint effusion. Skin staples are seen vertically over the anterior midline soft  tissues.   IMPRESSION: Expected postsurgical change post knee arthroplasty.   ASSESSMENT/PLAN:  Left knee pain-status post left total knee revision. Continue rehabilitation. Acute blood loss anemia-status post transfusion. Recheck hemoglobin. Hypertension-well controlled Constipation- MiraLax and Colace started Dementia-continue Aricept Thrombocytopenia-recheck Check CBC  I have reviewed patient's medical records received at admission/from hospitalization.  CPT CODE: [id number]  Angela CoxGayani Y Dasanayaka, MD [facility] [phone]

## 2013-10-17 DIAGNOSIS — F0390 Unspecified dementia without behavioral disturbance: Secondary | ICD-10-CM | POA: Insufficient documentation

## 2013-10-17 DIAGNOSIS — Z96659 Presence of unspecified artificial knee joint: Secondary | ICD-10-CM | POA: Diagnosis not present

## 2013-10-17 DIAGNOSIS — T84498A Other mechanical complication of other internal orthopedic devices, implants and grafts, initial encounter: Secondary | ICD-10-CM | POA: Diagnosis not present

## 2013-10-17 DIAGNOSIS — M25569 Pain in unspecified knee: Secondary | ICD-10-CM | POA: Diagnosis not present

## 2013-10-17 NOTE — Progress Notes (Signed)
Patient ID: [name], female   DOB: [birth year], 80 y.o.   MRN: [id number]               PROGRESS NOTE  DATE: 10/10/2013  FACILITY: Nursing Home Location: [facility]  LEVEL OF CARE: SNF (31)  Acute Visit  CHIEF COMPLAINT:  Follow-up Hospitalization   HISTORY OF PRESENT ILLNESS: This is an 80 year old female who has been admitted to [facility] on 10/07/13 from [facility] with Painful loosened left total knee arthroplasty S/P left total knee revision. She has been admitted for a short-term rehabilitation.  REASSESSMENT OF ONGOING PROBLEM(S):  HTN: Pt 's HTN remains stable.  Denies CP, sob, DOE, pedal edema, headaches, dizziness or visual disturbances.  No complications from the medications currently being used.  Last BP : 120/62  DEMENTIA: The dementia remaines stable and continues to function adequately in the current living environment with supervision.  The patient has had little changes in behavior. No complications noted from the medications presently being used.  ANEMIA: The anemia has been stable. The patient denies fatigue, melena or hematochezia. No complications from the medications currently being used. 6/15 hgb 9.8   PAST MEDICAL HISTORY : Reviewed.  No changes/see problem list  CURRENT MEDICATIONS: Reviewed per MAR/see medication list  REVIEW OF SYSTEMS:  GENERAL: no change in appetite, no fatigue, no weight changes, no fever, chills or weakness RESPIRATORY: no cough, SOB, DOE, wheezing, hemoptysis CARDIAC: no chest pain, edema or palpitations GI: no abdominal pain, diarrhea, heart burn, nausea or vomiting, +constipation  PHYSICAL EXAMINATION  GENERAL: no acute distress, obese EYES: conjunctivae normal, sclerae normal, normal eye lids NECK: supple, trachea midline, no neck masses, no thyroid tenderness, no thyromegaly LYMPHATICS: no LAN in the neck, no supraclavicular LAN RESPIRATORY: breathing is even & unlabored, BS  CTAB CARDIAC: RRR, + systolic 3/6 murmur,no extra heart sounds, no edema GI: abdomen soft, normal BS, no masses, no tenderness, no hepatomegaly, no splenomegaly EXTREMITIES: able to move all 4 extremities ; has limited ROM on LLE due to surgery PSYCHIATRIC: the patient is alert & oriented to person, affect & behavior appropriate  LABS/RADIOLOGY: Labs reviewed: Basic Metabolic Panel:  Recent Labs  32/99/24 0635 10/06/13 0543 10/07/13 0615  NA 138 138 137  K 4.5 3.5* 3.9  CL 101 97 98  CO2 26 25 26   GLUCOSE 164* 121* 113*  BUN 19 15 12   CREATININE 0.68 0.63 0.63  CALCIUM 8.4 8.3* 8.7   Liver Function Tests:  Recent Labs  09/26/13 1047  AST 21  ALT 12  ALKPHOS 102  BILITOT 0.4  PROT 7.4  ALBUMIN 4.0   CBC:  Recent Labs  09/26/13 1047  10/06/13 2230 10/07/13 0615 10/07/13 1335  WBC 5.2  < > 8.5 8.1 8.1  NEUTROABS 3.2  --   --   --   --   HGB 12.9  < > 8.9* 9.5* 9.8*  HCT 38.1  < > 26.7* 29.2* 29.2*  MCV 84.1  < > 84.0 83.9 83.2  PLT 170  < > 123* 145* 144*  < > = values in this interval not displayed.   ASSESSMENT/PLAN:  Painful loosened left total knee arthroplasty S/P left total knee revision - for rehabilitation;add OxyIR 5 mg give 2 tabs PO Q 8AM, 12PM, 4PM and 8PM  Anemia, acute blood loss - stable Hypertension - well-controlled; continue Norvasc and HCTZ Dementia - continue Aricept Constipation - start Colace 100 mg PO BID and Miralax 17 gm +  6-8 oz liquid PO BID    CPT CODE: [id number]   [name] - NP [facility] [phone]

## 2013-10-18 ENCOUNTER — Encounter: Payer: Self-pay | Admitting: Adult Health

## 2013-10-18 ENCOUNTER — Non-Acute Institutional Stay (SKILLED_NURSING_FACILITY): Payer: Medicare Other | Admitting: Adult Health

## 2013-10-18 DIAGNOSIS — M25562 Pain in left knee: Secondary | ICD-10-CM

## 2013-10-18 DIAGNOSIS — Z96659 Presence of unspecified artificial knee joint: Secondary | ICD-10-CM

## 2013-10-18 DIAGNOSIS — D62 Acute posthemorrhagic anemia: Secondary | ICD-10-CM

## 2013-10-18 DIAGNOSIS — F0390 Unspecified dementia without behavioral disturbance: Secondary | ICD-10-CM

## 2013-10-18 DIAGNOSIS — Z96652 Presence of left artificial knee joint: Secondary | ICD-10-CM

## 2013-10-18 DIAGNOSIS — K5900 Constipation, unspecified: Secondary | ICD-10-CM

## 2013-10-18 DIAGNOSIS — I10 Essential (primary) hypertension: Secondary | ICD-10-CM

## 2013-10-18 DIAGNOSIS — M25569 Pain in unspecified knee: Secondary | ICD-10-CM

## 2013-10-18 NOTE — Progress Notes (Signed)
Patient ID: [name], female   DOB: 10/23/1930, 80 y.o.   MRN: [id number]              PROGRESS NOTE  DATE: 10/18/13  FACILITY: Nursing Home Location: [facility]  LEVEL OF CARE: SNF (31)  Acute Visit  CHIEF COMPLAINT:  Discharge Notes  HISTORY OF PRESENT ILLNESS: This is an 80 year old female who is for discharge home with Home health PT and OT. She has been admitted to [facility] on 10/07/13 from [facility] with Painful loosened left total knee arthroplasty S/P left total knee revision. Patient was admitted to this facility for short-term rehabilitation after the patient's recent hospitalization.  Patient has completed SNF rehabilitation and therapy has cleared the patient for discharge.   REASSESSMENT OF ONGOING PROBLEM(S):  HTN: Pt 's HTN remains stable.  Denies CP, sob, DOE, pedal edema, headaches, dizziness or visual disturbances.  No complications from the medications currently being used.  Last BP : 132/87  ANEMIA: The anemia has been stable. The patient denies fatigue, melena or hematochezia. No complications from the medications currently being used. 6/15 hgb 9.4  CONSTIPATION: The constipation remains stable. No complications from the medications presently being used. Patient denies ongoing constipation, abdominal pain, nausea or vomiting.  PAST MEDICAL HISTORY : Reviewed.  No changes/see problem list  CURRENT MEDICATIONS: Reviewed per MAR/see medication list  REVIEW OF SYSTEMS:  GENERAL: no change in appetite, no fatigue, no weight changes, no fever, chills or weakness RESPIRATORY: no cough, SOB, DOE, wheezing, hemoptysis CARDIAC: no chest pain, edema or palpitations GI: no abdominal pain, diarrhea, heart burn, nausea or vomiting, +constipation  PHYSICAL EXAMINATION  GENERAL: no acute distress, obese NECK: supple, trachea midline, no neck masses, no thyroid tenderness, no thyromegaly LYMPHATICS: no LAN in the neck, no  supraclavicular LAN RESPIRATORY: breathing is even & unlabored, BS CTAB CARDIAC: RRR, + systolic 3/6 murmur,no extra heart sounds, no edema GI: abdomen soft, normal BS, no masses, no tenderness, no hepatomegaly, no splenomegaly EXTREMITIES: able to move all 4 extremities PSYCHIATRIC: the patient is alert & oriented to person, affect & behavior appropriate  LABS/RADIOLOGY: 10/09/13  hgb 9.4 Labs reviewed: Basic Metabolic Panel:  Recent Labs  85/88/50 0635 10/06/13 0543 10/07/13 0615  NA 138 138 137  K 4.5 3.5* 3.9  CL 101 97 98  CO2 26 25 26   GLUCOSE 164* 121* 113*  BUN 19 15 12   CREATININE 0.68 0.63 0.63  CALCIUM 8.4 8.3* 8.7   Liver Function Tests:  Recent Labs  09/26/13 1047  AST 21  ALT 12  ALKPHOS 102  BILITOT 0.4  PROT 7.4  ALBUMIN 4.0   CBC:  Recent Labs  09/26/13 1047  10/06/13 2230 10/07/13 0615 10/07/13 1335  WBC 5.2  < > 8.5 8.1 8.1  NEUTROABS 3.2  --   --   --   --   HGB 12.9  < > 8.9* 9.5* 9.8*  HCT 38.1  < > 26.7* 29.2* 29.2*  MCV 84.1  < > 84.0 83.9 83.2  PLT 170  < > 123* 145* 144*  < > = values in this interval not displayed.   ASSESSMENT/PLAN:  Painful loosened left total knee arthroplasty S/P left total knee revision - for Home health PT and OT Anemia, acute blood loss - stable Hypertension - well-controlled; continue Norvasc and HCTZ Dementia - continue Aricept Constipation - stable; continue Colace and Miralax  I have filled out patient's discharge paperwork and written prescriptions.  Patient will receive home health PT and OT.  Total discharge time: Less than 30 minutes  Discharge time involved coordination of the discharge process with Child psychotherapistsocial worker, nursing staff and therapy department. Medical justification for home health services verified.   CPT CODE: [id number]   [name] - NP [facility] [phone]

## 2013-10-20 DIAGNOSIS — Z5189 Encounter for other specified aftercare: Secondary | ICD-10-CM | POA: Diagnosis not present

## 2013-10-20 DIAGNOSIS — Z96659 Presence of unspecified artificial knee joint: Secondary | ICD-10-CM | POA: Diagnosis not present

## 2013-10-20 DIAGNOSIS — Z471 Aftercare following joint replacement surgery: Secondary | ICD-10-CM | POA: Diagnosis not present

## 2013-10-26 NOTE — Telephone Encounter (Signed)
Encounter complete. 

## 2013-11-10 ENCOUNTER — Encounter: Payer: Self-pay | Admitting: Cardiology

## 2013-11-10 ENCOUNTER — Ambulatory Visit (INDEPENDENT_AMBULATORY_CARE_PROVIDER_SITE_OTHER): Payer: Medicare Other | Admitting: Cardiology

## 2013-11-10 VITALS — BP 124/69 | HR 74 | Ht 61.0 in | Wt 210.0 lb

## 2013-11-10 DIAGNOSIS — I359 Nonrheumatic aortic valve disorder, unspecified: Secondary | ICD-10-CM | POA: Diagnosis not present

## 2013-11-10 DIAGNOSIS — I10 Essential (primary) hypertension: Secondary | ICD-10-CM

## 2013-11-10 DIAGNOSIS — M1712 Unilateral primary osteoarthritis, left knee: Secondary | ICD-10-CM

## 2013-11-10 DIAGNOSIS — I350 Nonrheumatic aortic (valve) stenosis: Secondary | ICD-10-CM

## 2013-11-10 DIAGNOSIS — M1710 Unilateral primary osteoarthritis, unspecified knee: Secondary | ICD-10-CM

## 2013-11-10 DIAGNOSIS — G4700 Insomnia, unspecified: Secondary | ICD-10-CM | POA: Diagnosis not present

## 2013-11-10 NOTE — Patient Instructions (Signed)
The current medical regimen is effective;  continue present plan and medications.  Your physician has requested that you have an echocardiogram in 6 months. Echocardiography is a painless test that uses sound waves to create images of your heart. It provides your doctor with information about the size and shape of your heart and how well your heart's chambers and valves are working. This procedure takes approximately one hour. There are no restrictions for this procedure.  Follow up in 6 months with Dr Anne Fu.  You will receive a letter in the mail 2 months before you are due.  Please call us when you receive this letter to schedule your follow up appointment.

## 2013-11-10 NOTE — Progress Notes (Signed)
[address] [place], [place]  [address] Phone: [phone] Fax:  [phone]   11/10/2013   ID:  Heather LucksRosemarie Zuk, DOB Nov 27, 1930, MRN [id number]  PCP:  Ginette OttoSTONEKING,HAL THOMAS, MD   History of Present Illness: [name] is a 80 y.o. female with moderate to severe aortic stenosis here for followup. Overall she is doing quite well. She tolerated left knee replacement in May of 2015. Nuclear stress test performed at that time was low risk overall. Asymptomatic, no complaints of shortness of breath, angina, syncope. She denies any history of prior stroke. No prior cardiovascular history such as MI.   [place] originally from [place], her husband is from [place]. She is currently on Aricept, memory impairment. This seems mild. Her main complaint currently is insomnia. She will be speaking with Dr. Pete GlatterStoneking about this.   Wt Readings from Last 3 Encounters:  11/10/13 210 lb (95.255 kg)  10/18/13 215 lb 12.8 oz (97.886 kg)  10/10/13 219 lb (99.338 kg)     Past Medical History  Diagnosis Date  . Hypertension     on treatment  . Arthritis   . Hepatitis     as a child" Liver infection", yellow jauncice  . History of nephrolithiasis     as child  . Aortic stenosis   . Thrombocytopenia, unspecified   . Benign tumor of lung     removed  . Osteopenia   . Complication of anesthesia     trouble waking up-denies  . Heart murmur   . Venous insufficiency     only varicose veins  . History of kidney stones   . Cancer     ovarian     Past Surgical History  Procedure Laterality Date  . Joint replacement  [id number]    rt and lft  . Total shoulder arthroplasty  2005    right  . Fracture surgery      rt wrist , left hand  . Vascular surgery  2005    varicose vein   . Total knee arthroplasty      bilateral  . Lung lobectomy  2004    rt upper lobe  . Shoulder hemi-arthroplasty  06/03/2011    Procedure: SHOULDER HEMI-ARTHROPLASTY;  Surgeon: Valeria BatmanPeter W  Whitfield, MD;  [facility];  Service: Orthopedics;  Laterality: Left;  Left Hemi Shoulder Hemi-Arthroplasty   . Abdominal hysterectomy      also oohorectomy ?side  . Revision total knee arthroplasty Left 10/04/2013    DR Cleophas DunkerWHITFIELD  . Total knee revision Left 10/04/2013    Procedure: TOTAL KNEE REVISION ;  Surgeon: Valeria BatmanPeter W Whitfield, MD;  [facility];  Service: Orthopedics;  Laterality: Left;  LEFT TOTAL KNEE REVISION     Current Outpatient Prescriptions  Medication Sig Dispense Refill  . acetaminophen (TYLENOL) 325 MG tablet Take 2 tablets (650 mg total) by mouth every 6 (six) hours as needed for mild pain (or Fever >/= 101).      Marland Kitchen. amLODipine (NORVASC) 2.5 MG tablet Take 2.5 mg by mouth daily.      . cholecalciferol (VITAMIN D) 1000 UNITS tablet Take 2,000 Units by mouth daily.       Marland Kitchen. docusate sodium (COLACE) 50 MG capsule Take 50 mg by mouth 1 day or 1 dose.      . donepezil (ARICEPT ODT) 10 MG disintegrating tablet Take 10 mg by mouth daily.       Marland Kitchen. etodolac (LODINE XL) 400 MG  24 hr tablet       . HYDROcodone-acetaminophen (NORCO) 7.5-325 MG per tablet       . Ibuprofen-Diphenhydramine Cit (ADVIL PM PO) Take by mouth.      Marland Kitchen KRILL OIL PO Take by mouth.      . Multiple Vitamins-Minerals (ICAPS AREDS FORMULA PO) Take 1 capsule by mouth daily.      . Naproxen Sod-Diphenhydramine (ALEVE PM PO) Take by mouth.       No current facility-administered medications for this visit.    Allergies:    Allergies  Allergen Reactions  . Lisinopril Cough  . Micardis [Telmisartan] Other (See Comments)    dizziness    Social History:  The patient  reports that she has never smoked. She has never used smokeless tobacco. She reports that she drinks about .5 ounces of alcohol per week. She reports that she does not use illicit drugs.   Family History  Problem Relation Age of Onset  . Anesthesia problems Neg Hx   . Hypotension Neg Hx   . Malignant hyperthermia Neg Hx   . Pseudochol  deficiency Neg Hx     ROS:  Please see the history of present illness.   denies any bleeding, syncope, orthopnea, PND, rash. No chest pain   All other systems reviewed and negative.   PHYSICAL EXAM: VS:  BP 124/69  Pulse 74  Ht 5\' 1"  (1.549 m)  Wt 210 lb (95.255 kg)  BMI 39.70 kg/m2 Well nourished, well developed, in no acute distress HEENT: normal, /AT, EOMI Neck: no JVD, normal carotid upstroke, radiation of aortic murmur to carotids bilaterally  Cardiac:  normal S1, S2; RRR; 3/6 mid crescendo systolic murmur Lungs:  clear to auscultation bilaterally, no wheezing, rhonchi or rales Abd: soft, nontender, no hepatomegaly, no bruits, overweight Ext: no edema, 2+ distal pulses, overweight Skin: warm and dry GU: deferred Neuro: no focal abnormalities noted, AAO x 3 MSK: Challenging to ambulate secondary to knee osteoarthritis.  EKG:  08/25/13-sinus rhythm, 65, poor R wave progression, question possible limb lead reversal. Left posterior fascicular block possible.  ECHO: 03/1613 -   - Left ventricle: The cavity size was normal. There was mild focal basal hypertrophy of the septum. Systolic function was normal. The estimated ejection fraction was in the range of 55% to 60%. Wall motion was normal; there were no regional wall motion abnormalities. There was an increased relative contribution of atrial contraction to ventricular filling. Features are consistent with a pseudonormal left ventricular filling pattern, with concomitant abnormal relaxation and increased filling pressure (grade 2 diastolic dysfunction). - Aortic valve: Severe diffuse thickening and calcification. There was moderate to severe stenosis. Mild regurgitation. Mean gradient: 32mm Hg (S). Peak gradient: 48mm Hg (S). - Mitral valve: Calcified annulus. Mildly thickened leaflets . - Right ventricle: The cavity size was mildly dilated. Wall thickness was normal. - Atrial septum: No defect or patent foramen ovale  was identified. - Pulmonary arteries: PA peak pressure: 32mm Hg (S).    ASSESSMENT AND PLAN:  1. Moderate to severe aortic stenosis-echocardiogram reviewed. Asymptomatic currently not describing any shortness of breath, angina, syncope. She will let me know immediately if she begins to have symptoms such as shortness of breath. We will continue to monitor her echocardiogram at least on a yearly basis or sooner if symptoms change. I have explained to them the physiology, pathology behind aortic stenosis and natural course including surgical correction.  2. Osteoarthritis-Dr. Cleophas Dunker is orthopedist. Left knee replacement 5/15. Tolerating well. 3.  Hypertension, essential-continue to treat. Medications reviewed. 4. Insomnia-discuss with Dr. Pete Glatter. 5. We'll see back in 6 months.  Signed, Donato Schultz, MD [facility]  11/10/2013 2:51 PM

## 2013-11-11 DIAGNOSIS — M6281 Muscle weakness (generalized): Secondary | ICD-10-CM | POA: Diagnosis not present

## 2013-11-11 DIAGNOSIS — M25669 Stiffness of unspecified knee, not elsewhere classified: Secondary | ICD-10-CM | POA: Diagnosis not present

## 2013-11-11 NOTE — OR Nursing (Signed)
Late entry for type and infection under procedural recordings

## 2013-11-14 DIAGNOSIS — M6281 Muscle weakness (generalized): Secondary | ICD-10-CM | POA: Diagnosis not present

## 2013-11-14 DIAGNOSIS — M25669 Stiffness of unspecified knee, not elsewhere classified: Secondary | ICD-10-CM | POA: Diagnosis not present

## 2013-11-16 DIAGNOSIS — M6281 Muscle weakness (generalized): Secondary | ICD-10-CM | POA: Diagnosis not present

## 2013-11-16 DIAGNOSIS — M25669 Stiffness of unspecified knee, not elsewhere classified: Secondary | ICD-10-CM | POA: Diagnosis not present

## 2013-11-21 DIAGNOSIS — M25669 Stiffness of unspecified knee, not elsewhere classified: Secondary | ICD-10-CM | POA: Diagnosis not present

## 2013-11-21 DIAGNOSIS — M6281 Muscle weakness (generalized): Secondary | ICD-10-CM | POA: Diagnosis not present

## 2013-11-24 DIAGNOSIS — M25669 Stiffness of unspecified knee, not elsewhere classified: Secondary | ICD-10-CM | POA: Diagnosis not present

## 2013-11-24 DIAGNOSIS — M6281 Muscle weakness (generalized): Secondary | ICD-10-CM | POA: Diagnosis not present

## 2013-11-29 DIAGNOSIS — M25669 Stiffness of unspecified knee, not elsewhere classified: Secondary | ICD-10-CM | POA: Diagnosis not present

## 2013-11-29 DIAGNOSIS — M6281 Muscle weakness (generalized): Secondary | ICD-10-CM | POA: Diagnosis not present

## 2013-11-29 SURGERY — Surgical Case
Anesthesia: *Unknown

## 2013-12-01 DIAGNOSIS — M25669 Stiffness of unspecified knee, not elsewhere classified: Secondary | ICD-10-CM | POA: Diagnosis not present

## 2013-12-01 DIAGNOSIS — M6281 Muscle weakness (generalized): Secondary | ICD-10-CM | POA: Diagnosis not present

## 2013-12-06 DIAGNOSIS — R262 Difficulty in walking, not elsewhere classified: Secondary | ICD-10-CM | POA: Diagnosis not present

## 2013-12-06 DIAGNOSIS — M25569 Pain in unspecified knee: Secondary | ICD-10-CM | POA: Diagnosis not present

## 2013-12-06 DIAGNOSIS — M6281 Muscle weakness (generalized): Secondary | ICD-10-CM | POA: Diagnosis not present

## 2013-12-14 DIAGNOSIS — M6281 Muscle weakness (generalized): Secondary | ICD-10-CM | POA: Diagnosis not present

## 2013-12-14 DIAGNOSIS — M25669 Stiffness of unspecified knee, not elsewhere classified: Secondary | ICD-10-CM | POA: Diagnosis not present

## 2013-12-20 DIAGNOSIS — M25669 Stiffness of unspecified knee, not elsewhere classified: Secondary | ICD-10-CM | POA: Diagnosis not present

## 2013-12-20 DIAGNOSIS — M6281 Muscle weakness (generalized): Secondary | ICD-10-CM | POA: Diagnosis not present

## 2013-12-22 DIAGNOSIS — M25669 Stiffness of unspecified knee, not elsewhere classified: Secondary | ICD-10-CM | POA: Diagnosis not present

## 2013-12-22 DIAGNOSIS — M6281 Muscle weakness (generalized): Secondary | ICD-10-CM | POA: Diagnosis not present

## 2013-12-28 DIAGNOSIS — M6281 Muscle weakness (generalized): Secondary | ICD-10-CM | POA: Diagnosis not present

## 2013-12-28 DIAGNOSIS — M25669 Stiffness of unspecified knee, not elsewhere classified: Secondary | ICD-10-CM | POA: Diagnosis not present

## 2014-01-05 DIAGNOSIS — M6281 Muscle weakness (generalized): Secondary | ICD-10-CM | POA: Diagnosis not present

## 2014-01-09 DIAGNOSIS — M6281 Muscle weakness (generalized): Secondary | ICD-10-CM | POA: Diagnosis not present

## 2014-01-12 DIAGNOSIS — M6281 Muscle weakness (generalized): Secondary | ICD-10-CM | POA: Diagnosis not present

## 2014-01-16 DIAGNOSIS — M6281 Muscle weakness (generalized): Secondary | ICD-10-CM | POA: Diagnosis not present

## 2014-01-16 DIAGNOSIS — M25669 Stiffness of unspecified knee, not elsewhere classified: Secondary | ICD-10-CM | POA: Diagnosis not present

## 2014-01-19 DIAGNOSIS — M6281 Muscle weakness (generalized): Secondary | ICD-10-CM | POA: Diagnosis not present

## 2014-01-19 DIAGNOSIS — M1710 Unilateral primary osteoarthritis, unspecified knee: Secondary | ICD-10-CM | POA: Diagnosis not present

## 2014-01-19 DIAGNOSIS — Z96659 Presence of unspecified artificial knee joint: Secondary | ICD-10-CM | POA: Diagnosis not present

## 2014-03-06 DIAGNOSIS — Z96652 Presence of left artificial knee joint: Secondary | ICD-10-CM | POA: Diagnosis not present

## 2014-03-06 DIAGNOSIS — M1712 Unilateral primary osteoarthritis, left knee: Secondary | ICD-10-CM | POA: Diagnosis not present

## 2014-03-21 DIAGNOSIS — H2513 Age-related nuclear cataract, bilateral: Secondary | ICD-10-CM | POA: Diagnosis not present

## 2014-03-29 DIAGNOSIS — I10 Essential (primary) hypertension: Secondary | ICD-10-CM | POA: Diagnosis not present

## 2014-03-29 DIAGNOSIS — Z23 Encounter for immunization: Secondary | ICD-10-CM | POA: Diagnosis not present

## 2014-03-29 DIAGNOSIS — G301 Alzheimer's disease with late onset: Secondary | ICD-10-CM | POA: Diagnosis not present

## 2014-03-29 DIAGNOSIS — Q253 Supravalvular aortic stenosis: Secondary | ICD-10-CM | POA: Diagnosis not present

## 2014-04-18 DIAGNOSIS — Z1231 Encounter for screening mammogram for malignant neoplasm of breast: Secondary | ICD-10-CM | POA: Diagnosis not present

## 2014-04-18 DIAGNOSIS — Z803 Family history of malignant neoplasm of breast: Secondary | ICD-10-CM | POA: Diagnosis not present

## 2014-05-15 ENCOUNTER — Ambulatory Visit (HOSPITAL_COMMUNITY): Payer: Medicare Other | Attending: Internal Medicine

## 2014-05-15 DIAGNOSIS — I350 Nonrheumatic aortic (valve) stenosis: Secondary | ICD-10-CM | POA: Diagnosis not present

## 2014-05-15 NOTE — Progress Notes (Signed)
2D Echo completed. 05/15/2014 

## 2014-06-01 ENCOUNTER — Ambulatory Visit (INDEPENDENT_AMBULATORY_CARE_PROVIDER_SITE_OTHER): Payer: Medicare Other | Admitting: Cardiology

## 2014-06-01 ENCOUNTER — Encounter: Payer: Self-pay | Admitting: Cardiology

## 2014-06-01 VITALS — BP 122/82 | HR 73 | Ht 61.0 in | Wt 233.0 lb

## 2014-06-01 DIAGNOSIS — I350 Nonrheumatic aortic (valve) stenosis: Secondary | ICD-10-CM

## 2014-06-01 DIAGNOSIS — IMO0001 Reserved for inherently not codable concepts without codable children: Secondary | ICD-10-CM

## 2014-06-01 NOTE — Progress Notes (Signed)
[address] [name], [place]  [address] Phone: [phone] Fax:  [phone]   06/01/2014   ID:  [name], DOB 04-Sep-1930, MRN [id number]  PCP:  Ginette Otto, MD   History of Present Illness: [name] is a 81 y.o. female with moderate aortic stenosis here for followup. Overall she is doing quite well. She tolerated left knee replacement in May of 2015. Nuclear stress test performed at that time was low risk overall. Asymptomatic, no complaints of shortness of breath, angina, syncope. She denies any history of prior stroke. No prior cardiovascular history such as MI.   She is originally from [place], her husband is from [place]. She is currently on Aricept, memory impairment. This seems mild. Her main complaint previously was insomnia.    overall no significant changes.   Wt Readings from Last 3 Encounters:  06/01/14 233 lb (105.688 kg)  11/10/13 210 lb (95.255 kg)  10/18/13 215 lb 12.8 oz (97.886 kg)     Past Medical History  Diagnosis Date  . Hypertension     on treatment  . Arthritis   . Hepatitis     as a child" Liver infection", yellow jauncice  . History of nephrolithiasis     as child  . Aortic stenosis   . Thrombocytopenia, unspecified   . Benign tumor of lung     removed  . Osteopenia   . Complication of anesthesia     trouble waking up-denies  . Heart murmur   . Venous insufficiency     only varicose veins  . History of kidney stones   . Cancer     ovarian     Past Surgical History  Procedure Laterality Date  . Joint replacement  [id number]    rt and lft  . Total shoulder arthroplasty  2005    right  . Fracture surgery      rt wrist , left hand  . Vascular surgery  2005    varicose vein   . Total knee arthroplasty      bilateral  . Lung lobectomy  2004    rt upper lobe  . Shoulder hemi-arthroplasty  06/03/2011    Procedure: SHOULDER HEMI-ARTHROPLASTY;  Surgeon: Valeria Batman, MD;  [facility];   Service: Orthopedics;  Laterality: Left;  Left Hemi Shoulder Hemi-Arthroplasty   . Abdominal hysterectomy      also oohorectomy ?side  . Revision total knee arthroplasty Left 10/04/2013    DR Cleophas Dunker  . Total knee revision Left 10/04/2013    Procedure: TOTAL KNEE REVISION ;  Surgeon: Valeria Batman, MD;  Location: [facility] OR;  Service: Orthopedics;  Laterality: Left;  LEFT TOTAL KNEE REVISION     Current Outpatient Prescriptions  Medication Sig Dispense Refill  . acetaminophen (TYLENOL) 325 MG tablet Take 2 tablets (650 mg total) by mouth every 6 (six) hours as needed for mild pain (or Fever >/= 101).    Marland Kitchen Apoaequorin (PREVAGEN PO) Take by mouth.    . donepezil (ARICEPT ODT) 10 MG disintegrating tablet Take 10 mg by mouth daily.     . hydrochlorothiazide (MICROZIDE) 12.5 MG capsule Take 12.5 mg by mouth daily.     Marland Kitchen KRILL OIL PO Take by mouth daily.     . Naproxen Sod-Diphenhydramine (ALEVE PM PO) Take by mouth as needed.      No current facility-administered medications for this visit.    Allergies:    Allergies  Allergen Reactions  .  Lisinopril Cough  . Micardis [Telmisartan] Other (See Comments)    dizziness    Social History:  The patient  reports that she has never smoked. She has never used smokeless tobacco. She reports that she drinks about 0.5 oz of alcohol per week. She reports that she does not use illicit drugs.   Family History  Problem Relation Age of Onset  . Anesthesia problems Neg Hx   . Hypotension Neg Hx   . Malignant hyperthermia Neg Hx   . Pseudochol deficiency Neg Hx     ROS:  Please see the history of present illness.   denies any bleeding, syncope, orthopnea, PND, rash. No chest pain   All other systems reviewed and negative.   PHYSICAL EXAM: VS:  BP 122/82 mmHg  Pulse 73  Ht  (1.549 m)  Wt 233 lb (105.688 kg)  BMI 44.05 kg/m2 Well nourished, well developed, in no acute distress HEENT: normal, [place]/AT, EOMI Neck: no JVD, normal carotid  upstroke, radiation of aortic murmur to carotids bilaterally  Cardiac:  normal S1, S2; RRR; 3/6 mid crescendo systolic murmur Lungs:  clear to auscultation bilaterally, no wheezing, rhonchi or rales Abd: soft, nontender, no hepatomegaly, no bruits, overweight Ext: no edema, 2+ distal pulses, overweight Skin: warm and dry GU: deferred Neuro: no focal abnormalities noted, AAO x 3 MSK: Challenging to ambulate secondary to knee osteoarthritis.  EKG:  08/25/13-sinus rhythm, 65, poor R wave progression, question possible limb lead reversal. Left posterior fascicular block possible.  ECHO: 05/15/14: Normal ejection fraction, moderate aortic stenosis  change from prior  ASSESSMENT AND PLAN:  1. Moderate to severe aortic stenosis-echocardiogram reviewed. No change. Asymptomatic currently not describing any shortness of breath, angina, syncope. She will let me know immediately if she begins to have symptoms such as shortness of breath. We will continue to monitor her echocardiogram at least on a yearly basis or sooner if symptoms change. I have explained to them the physiology, pathology behind aortic stenosis and natural course including surgical correction.  2. Osteoarthritis-Dr. Cleophas Dunker is orthopedist. Left knee replacement 5/15. Tolerating well. 3. Hypertension, essential-continue to treat. Medications reviewed. 4. We'll see back in 6 months.  Signed, Donato Schultz, MD [facility]  06/01/2014 2:26 PM

## 2014-06-01 NOTE — Patient Instructions (Signed)
The current medical regimen is effective;  continue present plan and medications.  Follow up in 6 months with Dr. Skains.  You will receive a letter in the mail 2 months before you are due.  Please call us when you receive this letter to schedule your follow up appointment.  Thank you for choosing [facility]!!     

## 2014-08-14 DIAGNOSIS — M47817 Spondylosis without myelopathy or radiculopathy, lumbosacral region: Secondary | ICD-10-CM | POA: Diagnosis not present

## 2014-08-14 DIAGNOSIS — M1712 Unilateral primary osteoarthritis, left knee: Secondary | ICD-10-CM | POA: Diagnosis not present

## 2014-08-14 DIAGNOSIS — M545 Low back pain: Secondary | ICD-10-CM | POA: Diagnosis not present

## 2014-08-15 ENCOUNTER — Other Ambulatory Visit: Payer: Self-pay | Admitting: Orthopaedic Surgery

## 2014-08-15 DIAGNOSIS — M545 Low back pain: Secondary | ICD-10-CM

## 2014-08-26 ENCOUNTER — Other Ambulatory Visit: Payer: PRIVATE HEALTH INSURANCE

## 2014-09-06 ENCOUNTER — Ambulatory Visit
Admission: RE | Admit: 2014-09-06 | Discharge: 2014-09-06 | Disposition: A | Discharge status: Home / Self Care | Payer: Medicare Other | Source: Ambulatory Visit | Attending: Orthopaedic Surgery | Admitting: Orthopaedic Surgery

## 2014-09-06 DIAGNOSIS — M545 Low back pain: Secondary | ICD-10-CM

## 2014-09-06 DIAGNOSIS — M5127 Other intervertebral disc displacement, lumbosacral region: Secondary | ICD-10-CM | POA: Diagnosis not present

## 2014-09-06 DIAGNOSIS — M47817 Spondylosis without myelopathy or radiculopathy, lumbosacral region: Secondary | ICD-10-CM | POA: Diagnosis not present

## 2014-09-06 DIAGNOSIS — M5137 Other intervertebral disc degeneration, lumbosacral region: Secondary | ICD-10-CM | POA: Diagnosis not present

## 2014-09-18 DIAGNOSIS — M47817 Spondylosis without myelopathy or radiculopathy, lumbosacral region: Secondary | ICD-10-CM | POA: Diagnosis not present

## 2014-09-18 DIAGNOSIS — M1712 Unilateral primary osteoarthritis, left knee: Secondary | ICD-10-CM | POA: Diagnosis not present

## 2014-09-18 DIAGNOSIS — M545 Low back pain: Secondary | ICD-10-CM | POA: Diagnosis not present

## 2014-09-19 DIAGNOSIS — H2513 Age-related nuclear cataract, bilateral: Secondary | ICD-10-CM | POA: Diagnosis not present

## 2014-09-19 DIAGNOSIS — H3531 Nonexudative age-related macular degeneration: Secondary | ICD-10-CM | POA: Diagnosis not present

## 2014-10-06 DIAGNOSIS — H3532 Exudative age-related macular degeneration: Secondary | ICD-10-CM | POA: Diagnosis not present

## 2014-11-07 DIAGNOSIS — S20211A Contusion of right front wall of thorax, initial encounter: Secondary | ICD-10-CM | POA: Diagnosis not present

## 2014-11-14 DIAGNOSIS — H3532 Exudative age-related macular degeneration: Secondary | ICD-10-CM | POA: Diagnosis not present

## 2014-11-14 DIAGNOSIS — H3531 Nonexudative age-related macular degeneration: Secondary | ICD-10-CM | POA: Diagnosis not present

## 2014-11-17 DIAGNOSIS — G301 Alzheimer's disease with late onset: Secondary | ICD-10-CM | POA: Diagnosis not present

## 2014-11-17 DIAGNOSIS — I10 Essential (primary) hypertension: Secondary | ICD-10-CM | POA: Diagnosis not present

## 2014-11-17 DIAGNOSIS — Q253 Supravalvular aortic stenosis: Secondary | ICD-10-CM | POA: Diagnosis not present

## 2014-11-17 DIAGNOSIS — Z79899 Other long term (current) drug therapy: Secondary | ICD-10-CM | POA: Diagnosis not present

## 2015-01-15 ENCOUNTER — Encounter: Payer: Self-pay | Admitting: Cardiology

## 2015-01-15 ENCOUNTER — Ambulatory Visit (INDEPENDENT_AMBULATORY_CARE_PROVIDER_SITE_OTHER): Payer: Medicare Other | Admitting: Cardiology

## 2015-01-15 VITALS — BP 138/90 | HR 75 | Ht 65.0 in | Wt 223.8 lb

## 2015-01-15 DIAGNOSIS — I10 Essential (primary) hypertension: Secondary | ICD-10-CM

## 2015-01-15 DIAGNOSIS — I350 Nonrheumatic aortic (valve) stenosis: Secondary | ICD-10-CM

## 2015-01-15 DIAGNOSIS — W19XXXS Unspecified fall, sequela: Secondary | ICD-10-CM

## 2015-01-15 NOTE — Patient Instructions (Signed)
Medication Instructions:  Your physician recommends that you continue on your current medications as directed. Please refer to the Current Medication list given to you today.  Testing/Procedures: Your physician has requested that you have an echocardiogram. Echocardiography is a painless test that uses sound waves to create images of your heart. It provides your doctor with information about the size and shape of your heart and how well your heart's chambers and valves are working. This procedure takes approximately one hour. There are no restrictions for this procedure.  Follow-Up: Follow up in 6 months with Dr. Skains.  You will receive a letter in the mail 2 months before you are due.  Please call us when you receive this letter to schedule your follow up appointment.  Thank you for choosing [facility]!!      

## 2015-01-15 NOTE — Progress Notes (Addendum)
[address] [place], [place]  [address] Phone: [phone] Fax:  [phone]   01/15/2015   ID:  [name], DOB August 10, 1930, MRN [id number]  PCP:  Ginette Otto, MD   History of Present Illness: [name] is a 81 y.o. female with moderate aortic stenosis here for followup. Overall she is doing quite well. She tolerated left knee replacement in May of 2015. Nuclear stress test performed at that time was low risk overall. Asymptomatic, no complaints of shortness of breath, angina, syncope. She denies any history of prior stroke. No prior cardiovascular history such as MI.   She is originally from [place], her husband is from [place]. She is currently on Aricept, memory impairment. This seems mild. Her main complaint previously was insomnia.   She has fallen, mechanical falls. No syncope.   overall no significant changes.   Wt Readings from Last 3 Encounters:  01/15/15 223 lb 12.8 oz (101.515 kg)  06/01/14 233 lb (105.688 kg)  11/10/13 210 lb (95.255 kg)     Past Medical History  Diagnosis Date  . Hypertension     on treatment  . Arthritis   . Hepatitis     as a child" Liver infection", yellow jauncice  . History of nephrolithiasis     as child  . Aortic stenosis   . Thrombocytopenia, unspecified   . Benign tumor of lung     removed  . Osteopenia   . Complication of anesthesia     trouble waking up-denies  . Heart murmur   . Venous insufficiency     only varicose veins  . History of kidney stones   . Cancer     ovarian     Past Surgical History  Procedure Laterality Date  . Joint replacement  [id number]    rt and lft  . Total shoulder arthroplasty  2005    right  . Fracture surgery      rt wrist , left hand  . Vascular surgery  2005    varicose vein   . Total knee arthroplasty      bilateral  . Lung lobectomy  2004    rt upper lobe  . Shoulder hemi-arthroplasty  06/03/2011    Procedure: SHOULDER HEMI-ARTHROPLASTY;   Surgeon: Valeria Batman, MD;  Location: [facility] OR;  Service: Orthopedics;  Laterality: Left;  Left Hemi Shoulder Hemi-Arthroplasty   . Abdominal hysterectomy      also oohorectomy ?side  . Revision total knee arthroplasty Left 10/04/2013    DR Cleophas Dunker  . Total knee revision Left 10/04/2013    Procedure: TOTAL KNEE REVISION ;  Surgeon: Valeria Batman, MD;  Location: [facility] OR;  Service: Orthopedics;  Laterality: Left;  LEFT TOTAL KNEE REVISION     Current Outpatient Prescriptions  Medication Sig Dispense Refill  . acetaminophen (TYLENOL) 325 MG tablet Take 2 tablets (650 mg total) by mouth every 6 (six) hours as needed for mild pain (or Fever >/= 101).    Marland Kitchen Apoaequorin (PREVAGEN PO) Take by mouth.    . donepezil (ARICEPT ODT) 10 MG disintegrating tablet Take 10 mg by mouth daily.     . hydrochlorothiazide (MICROZIDE) 12.5 MG capsule Take 12.5 mg by mouth daily.     Marland Kitchen KRILL OIL PO Take by mouth daily.     . Multiple Vitamins-Minerals (PRESERVISION AREDS 2 PO) Take 1 tablet by mouth daily.    . Naproxen Sod-Diphenhydramine (ALEVE PM PO) Take by mouth as  needed.     . vitamin E (VITAMIN E) 200 UNIT capsule Take 600 Units by mouth daily.     No current facility-administered medications for this visit.    Allergies:    Allergies  Allergen Reactions  . Lisinopril Cough  . Micardis [Telmisartan] Other (See Comments)    dizziness    Social History:  The patient  reports that she has never smoked. She has never used smokeless tobacco. She reports that she drinks about 0.5 oz of alcohol per week. She reports that she does not use illicit drugs.   Family History  Problem Relation Age of Onset  . Anesthesia problems Neg Hx   . Hypotension Neg Hx   . Malignant hyperthermia Neg Hx   . Pseudochol deficiency Neg Hx     ROS:  Please see the history of present illness.   denies any bleeding, syncope, orthopnea, PND, rash. No chest pain   All other systems reviewed and negative.   PHYSICAL  EXAM: VS:  BP 138/90 mmHg  Pulse 75  Ht  (1.651 m)  Wt 223 lb 12.8 oz (101.515 kg)  BMI 37.24 kg/m2 Well nourished, well developed, in no acute distress HEENT: normal, bruising over her nose, ecchymosis, EOMI Neck: no JVD, normal carotid upstroke, radiation of aortic murmur to carotids bilaterally  Cardiac:  normal S1, S2; RRR; 3/6 mid crescendo systolic murmur Lungs:  clear to auscultation bilaterally, no wheezing, rhonchi or rales Abd: soft, nontender, no hepatomegaly, no bruits, overweight Ext: no edema, 2+ distal pulses, overweight prior bilateral knee replacement, shoulder replacement Skin: warm and dry GU: deferred Neuro: no focal abnormalities noted, AAO x 3 MSK: Challenging to ambulate secondary to knee osteoarthritis.  EKG:  today-01/15/15-ectopic atrial rhythm, premature atrial contraction, left anterior fascicular block, poor R-wave progression, LVH. 08/25/13-sinus rhythm, 65, poor R wave progression, question possible limb lead reversal. Left posterior fascicular block possible.  ECHO: 05/15/14: Normal ejection fraction, moderate aortic stenosis  change from prior  ASSESSMENT AND PLAN:  1. Moderate to severe aortic stenosis-echocardiogram reviewed. No change. Asymptomatic currently not describing any shortness of breath, angina, syncope. She will let me know immediately if she begins to have symptoms such as shortness of breath. We will continue to monitor her echocardiogram at least on a yearly basis or sooner if symptoms change. I have explained to them the physiology, pathology behind aortic stenosis and natural course including surgical correction.  2. Osteoarthritis-Dr. Cleophas Dunker is orthopedist. Left knee replacement 5/15. Tolerating well. 3. Hypertension, essential-continue to treat. Medications reviewed. 4. Unfortunately she has fallen once again. Mechanical fall. Encouraged use of normal shoes. 5. We'll see back in 6 months.  Signed, Donato Schultz, MD [facility] [place]   01/15/2015 3:58 PM

## 2015-01-24 ENCOUNTER — Other Ambulatory Visit (HOSPITAL_COMMUNITY): Payer: Medicare Other

## 2015-02-20 DIAGNOSIS — H353212 Exudative age-related macular degeneration, right eye, with inactive choroidal neovascularization: Secondary | ICD-10-CM | POA: Diagnosis not present

## 2015-02-20 DIAGNOSIS — H353122 Nonexudative age-related macular degeneration, left eye, intermediate dry stage: Secondary | ICD-10-CM | POA: Diagnosis not present

## 2015-04-05 DIAGNOSIS — G301 Alzheimer's disease with late onset: Secondary | ICD-10-CM | POA: Diagnosis not present

## 2015-04-05 DIAGNOSIS — I10 Essential (primary) hypertension: Secondary | ICD-10-CM | POA: Diagnosis not present

## 2015-04-05 DIAGNOSIS — Z23 Encounter for immunization: Secondary | ICD-10-CM | POA: Diagnosis not present

## 2015-04-05 DIAGNOSIS — R5383 Other fatigue: Secondary | ICD-10-CM | POA: Diagnosis not present

## 2015-04-05 DIAGNOSIS — Z79899 Other long term (current) drug therapy: Secondary | ICD-10-CM | POA: Diagnosis not present

## 2015-04-05 DIAGNOSIS — Q253 Supravalvular aortic stenosis: Secondary | ICD-10-CM | POA: Diagnosis not present

## 2015-04-05 DIAGNOSIS — D696 Thrombocytopenia, unspecified: Secondary | ICD-10-CM | POA: Diagnosis not present

## 2015-06-15 IMAGING — CR DG CHEST 2V
2 series · 2 of 2 positions shown · non-contrast
Comparison: PA and lateral chest 10/10/2003.

CLINICAL DATA: Patient for hip replacement.  Preoperative films.

EXAM:
CHEST  2 VIEW

[w chest pa]
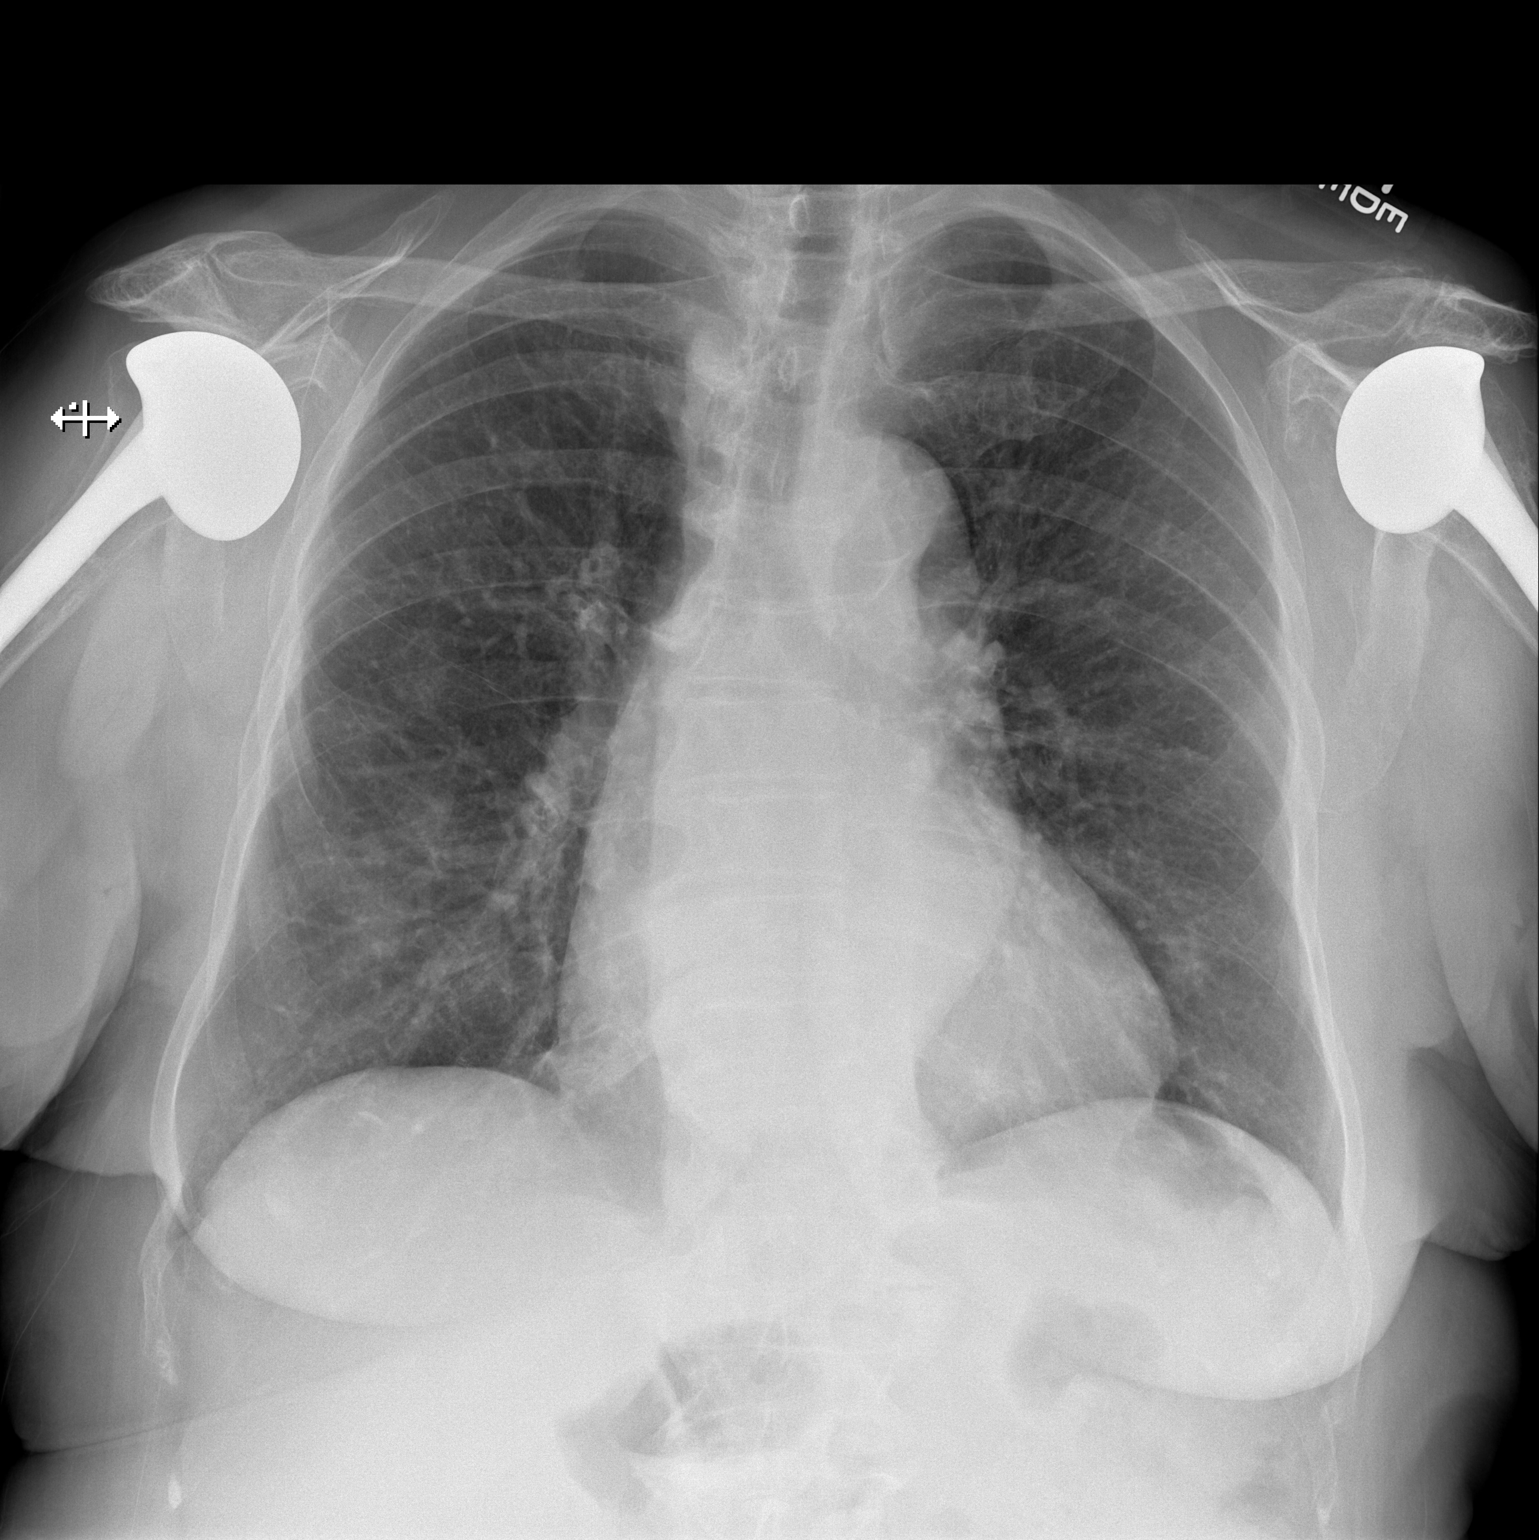

[w chest lat]
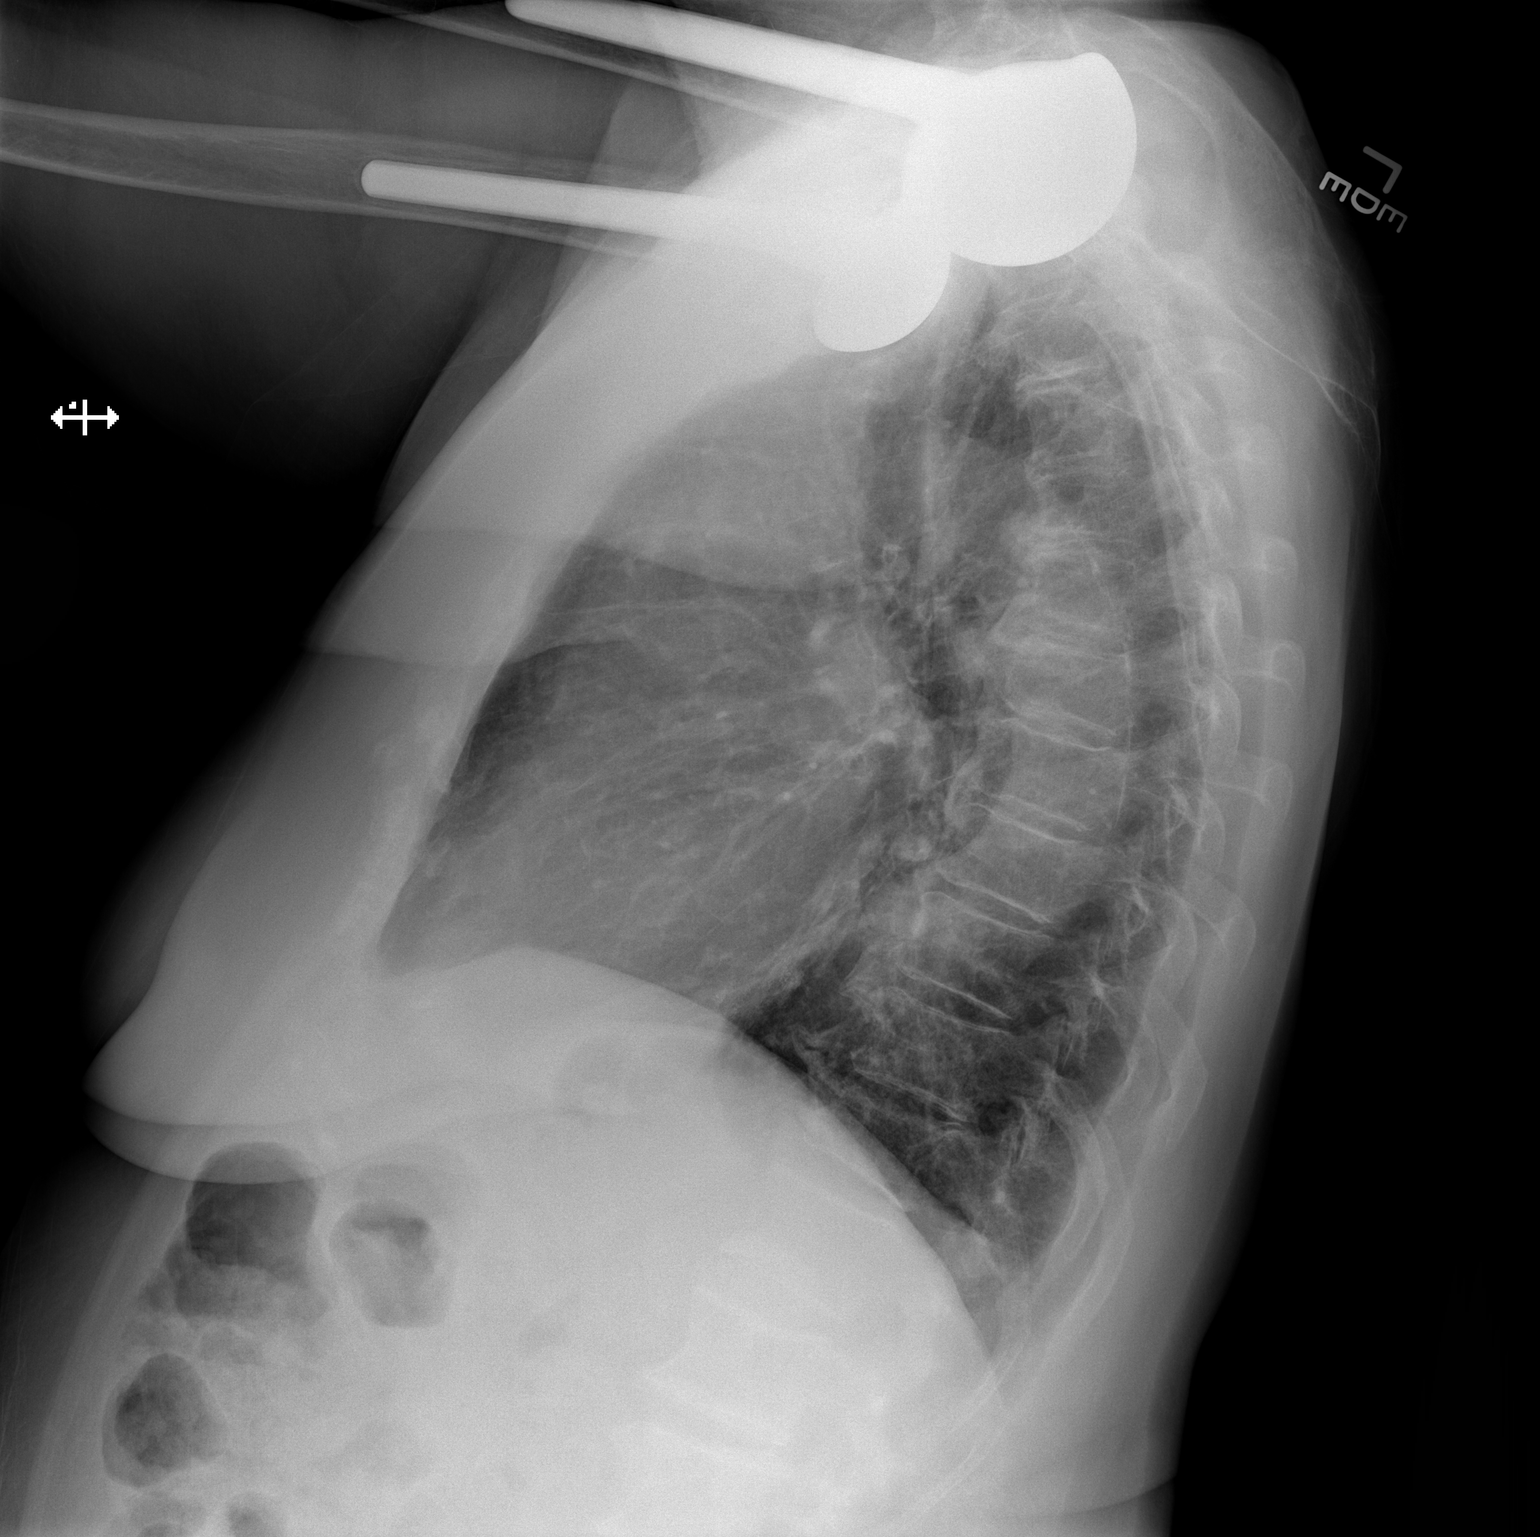

[2 of 2 positions shown; findings below may reference images not displayed]

FINDINGS: The lungs are clear. Heart size is upper normal. There is no
pneumothorax or pleural effusion. Mild inferior endplate compression
fracture at the thoracolumbar junction is age indeterminate.
IMPRESSION: No acute cardiopulmonary disease.

Mild inferior endplate compression fracture thoracolumbar junction
is age indeterminate.

## 2015-06-26 IMAGING — CR DG KNEE 1-2V*L*
2 series · 2 of 2 positions shown · non-contrast
Comparison: None.

CLINICAL DATA: Total knee replacement revision.

EXAM:
LEFT KNEE - 1-2 VIEW

[t knee ap left *]
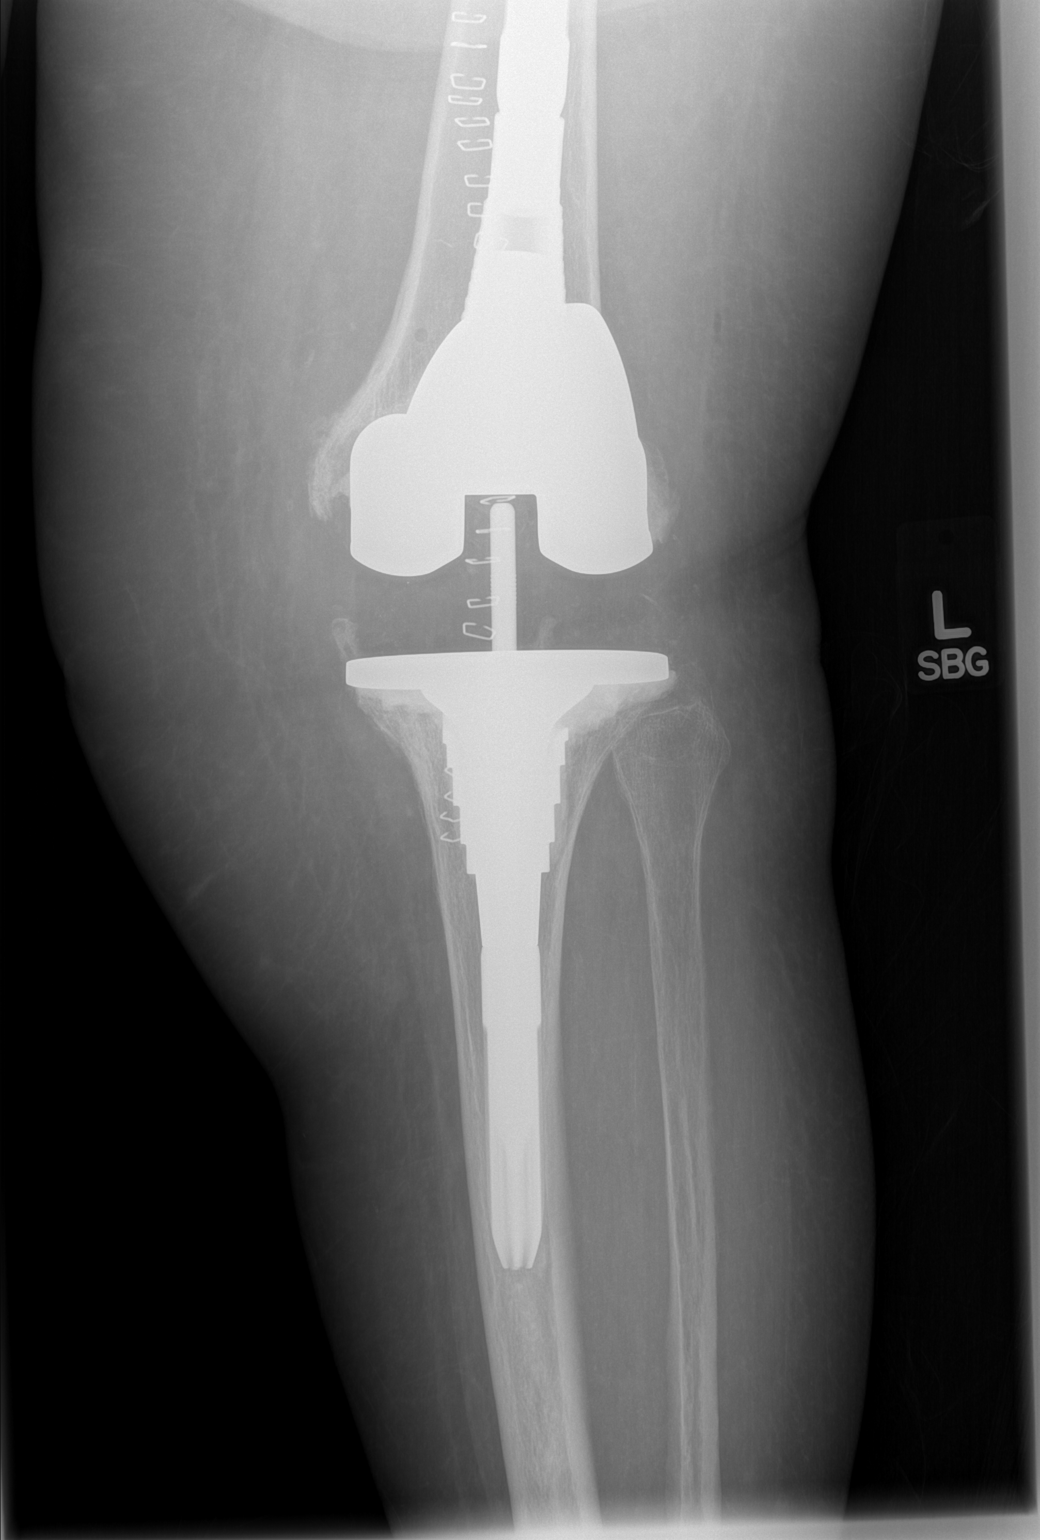

[t knee lat left]
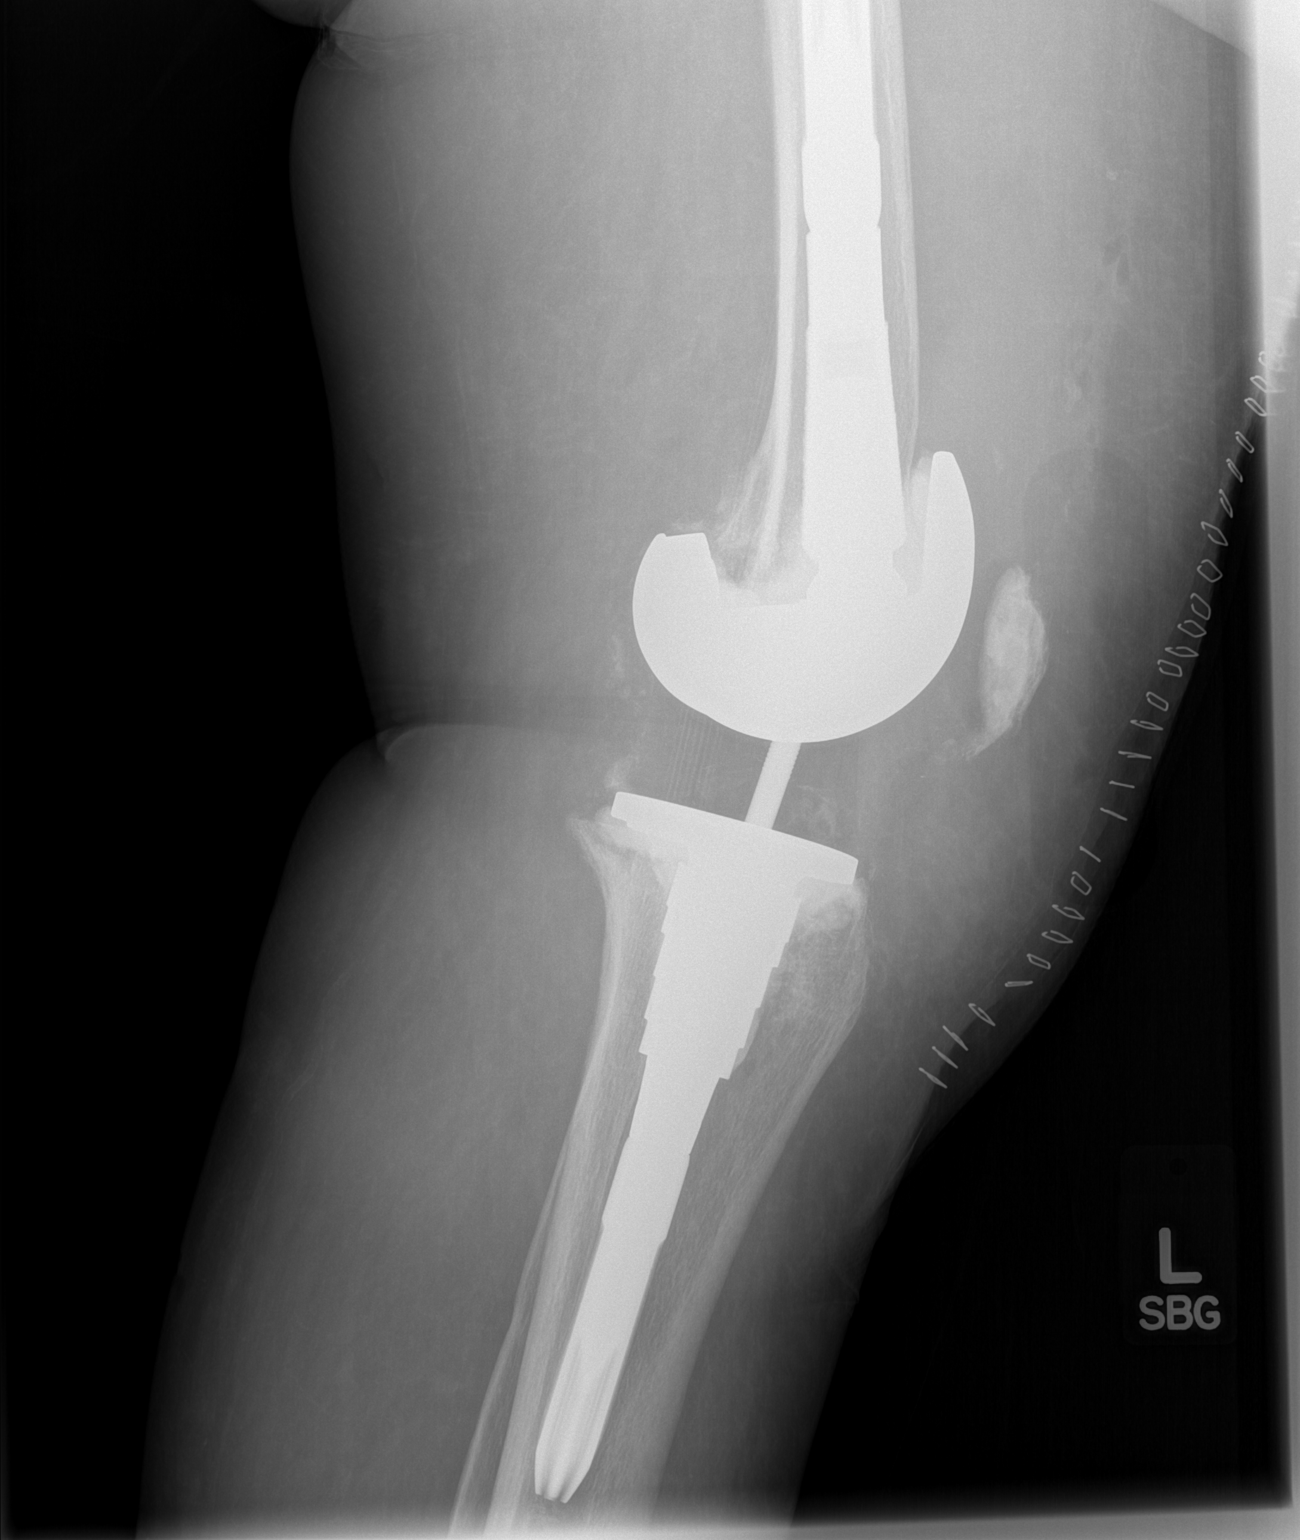

[2 of 2 positions shown; findings below may reference images not displayed]

FINDINGS: There is evidence of patient's total knee arthroplasty with
prosthetic components in adequate position and intact. There is no
acute fracture or dislocation. There is no significant joint
effusion. Skin staples are seen vertically over the anterior midline
soft tissues.
IMPRESSION: Expected postsurgical change post knee arthroplasty.

## 2015-07-02 DIAGNOSIS — Q253 Supravalvular aortic stenosis: Secondary | ICD-10-CM | POA: Diagnosis not present

## 2015-07-02 DIAGNOSIS — M25562 Pain in left knee: Secondary | ICD-10-CM | POA: Diagnosis not present

## 2015-07-02 DIAGNOSIS — Z1389 Encounter for screening for other disorder: Secondary | ICD-10-CM | POA: Diagnosis not present

## 2015-07-02 DIAGNOSIS — E669 Obesity, unspecified: Secondary | ICD-10-CM | POA: Diagnosis not present

## 2015-07-02 DIAGNOSIS — Z0000 Encounter for general adult medical examination without abnormal findings: Secondary | ICD-10-CM | POA: Diagnosis not present

## 2015-07-02 DIAGNOSIS — Z6839 Body mass index (BMI) 39.0-39.9, adult: Secondary | ICD-10-CM | POA: Diagnosis not present

## 2015-07-02 DIAGNOSIS — I10 Essential (primary) hypertension: Secondary | ICD-10-CM | POA: Diagnosis not present

## 2015-07-02 DIAGNOSIS — G301 Alzheimer's disease with late onset: Secondary | ICD-10-CM | POA: Diagnosis not present

## 2015-07-16 ENCOUNTER — Other Ambulatory Visit: Payer: Self-pay | Admitting: Cardiology

## 2015-07-16 ENCOUNTER — Other Ambulatory Visit: Payer: Self-pay

## 2015-07-16 ENCOUNTER — Ambulatory Visit: Payer: Medicare Other | Admitting: Cardiology

## 2015-07-16 ENCOUNTER — Ambulatory Visit (HOSPITAL_COMMUNITY): Payer: Medicare Other | Attending: Cardiology

## 2015-07-16 DIAGNOSIS — I119 Hypertensive heart disease without heart failure: Secondary | ICD-10-CM | POA: Insufficient documentation

## 2015-07-16 DIAGNOSIS — E669 Obesity, unspecified: Secondary | ICD-10-CM | POA: Diagnosis not present

## 2015-07-16 DIAGNOSIS — I350 Nonrheumatic aortic (valve) stenosis: Secondary | ICD-10-CM | POA: Diagnosis not present

## 2015-07-16 DIAGNOSIS — I352 Nonrheumatic aortic (valve) stenosis with insufficiency: Secondary | ICD-10-CM | POA: Insufficient documentation

## 2015-07-16 DIAGNOSIS — I77810 Thoracic aortic ectasia: Secondary | ICD-10-CM | POA: Insufficient documentation

## 2015-07-16 DIAGNOSIS — Z6837 Body mass index (BMI) 37.0-37.9, adult: Secondary | ICD-10-CM | POA: Insufficient documentation

## 2015-07-16 DIAGNOSIS — I071 Rheumatic tricuspid insufficiency: Secondary | ICD-10-CM | POA: Insufficient documentation

## 2015-07-16 DIAGNOSIS — I359 Nonrheumatic aortic valve disorder, unspecified: Secondary | ICD-10-CM | POA: Diagnosis present

## 2015-07-18 ENCOUNTER — Ambulatory Visit (INDEPENDENT_AMBULATORY_CARE_PROVIDER_SITE_OTHER): Payer: Medicare Other | Admitting: Cardiology

## 2015-07-18 ENCOUNTER — Encounter: Payer: Self-pay | Admitting: Cardiology

## 2015-07-18 ENCOUNTER — Telehealth: Payer: Self-pay | Admitting: Cardiology

## 2015-07-18 VITALS — BP 130/80 | HR 78 | Ht 65.0 in | Wt 236.0 lb

## 2015-07-18 DIAGNOSIS — R413 Other amnesia: Secondary | ICD-10-CM

## 2015-07-18 DIAGNOSIS — W19XXXS Unspecified fall, sequela: Secondary | ICD-10-CM | POA: Diagnosis not present

## 2015-07-18 DIAGNOSIS — I350 Nonrheumatic aortic (valve) stenosis: Secondary | ICD-10-CM | POA: Diagnosis not present

## 2015-07-18 DIAGNOSIS — I10 Essential (primary) hypertension: Secondary | ICD-10-CM | POA: Diagnosis not present

## 2015-07-18 NOTE — Telephone Encounter (Signed)
NA times multiple rings.  Will attempt again tomorrow.

## 2015-07-18 NOTE — Telephone Encounter (Signed)
New Message  Pt husband calling to speak w/ RN- echo results. Please call back and discuss.

## 2015-07-18 NOTE — Progress Notes (Signed)
[address] [place], [place]  [address] Phone: [phone] Fax:  [phone]   07/18/2015   ID:  [name], DOB [birth year], MRN [id number]  PCP:  Ginette Otto, MD   History of Present Illness: [name] is a 82 y.o. female with now severe aortic stenosis here for followup. Overall she is doing quite well except for some advancement in memory impairment. She tolerated left knee replacement in May of 2015. Nuclear stress test performed at that time was low risk overall.   Asymptomatic, no complaints of shortness of breath, angina, syncope. She denies any history of prior stroke. No prior cardiovascular history such as MI.   She is originally from [place], her husband is from [place]. She is currently on Namzaric, memory impairment. This seems mild to moderate.   She has fallen, mechanical falls. No syncope. When bends over falls forward.  Overall no significant changes.   Wt Readings from Last 3 Encounters:  07/18/15 236 lb (107.049 kg)  01/15/15 223 lb 12.8 oz (101.515 kg)  06/01/14 233 lb (105.688 kg)     Past Medical History  Diagnosis Date  . Hypertension     on treatment  . Arthritis   . Hepatitis     as a child" Liver infection", yellow jauncice  . History of nephrolithiasis     as child  . Aortic stenosis   . Thrombocytopenia, unspecified (HCC)   . Benign tumor of lung     removed  . Osteopenia   . Complication of anesthesia     trouble waking up-denies  . Heart murmur   . Venous insufficiency     only varicose veins  . History of kidney stones   . Cancer (HCC)     ovarian     Past Surgical History  Procedure Laterality Date  . Joint replacement  [id number]    rt and lft  . Total shoulder arthroplasty  2005    right  . Fracture surgery      rt wrist , left hand  . Vascular surgery  2005    varicose vein   . Total knee arthroplasty      bilateral  . Lung lobectomy  2004    rt upper lobe  . Shoulder  hemi-arthroplasty  06/03/2011    Procedure: SHOULDER HEMI-ARTHROPLASTY;  Surgeon: Valeria Batman, MD;  Location: [facility] OR;  Service: Orthopedics;  Laterality: Left;  Left Hemi Shoulder Hemi-Arthroplasty   . Abdominal hysterectomy      also oohorectomy ?side  . Revision total knee arthroplasty Left 10/04/2013    DR Cleophas Dunker  . Total knee revision Left 10/04/2013    Procedure: TOTAL KNEE REVISION ;  Surgeon: Valeria Batman, MD;  Location: [facility] OR;  Service: Orthopedics;  Laterality: Left;  LEFT TOTAL KNEE REVISION     Current Outpatient Prescriptions  Medication Sig Dispense Refill  . acetaminophen (TYLENOL) 325 MG tablet Take 2 tablets (650 mg total) by mouth every 6 (six) hours as needed for mild pain (or Fever >/= 101).    Marland Kitchen Apoaequorin (PREVAGEN PO) Take by mouth.    . hydrochlorothiazide (MICROZIDE) 12.5 MG capsule Take 12.5 mg by mouth daily.     Marland Kitchen KRILL OIL PO Take by mouth daily.     . Multiple Vitamins-Minerals (PRESERVISION AREDS 2 PO) Take 1 tablet by mouth daily.    Marland Kitchen NAMZARIC 28-10 MG CP24 Take 1 capsule by mouth daily.    . Naproxen  Sod-Diphenhydramine (ALEVE PM PO) Take by mouth as needed.     . vitamin E (VITAMIN E) 200 UNIT capsule Take 600 Units by mouth daily.     No current facility-administered medications for this visit.    Allergies:    Allergies  Allergen Reactions  . Lisinopril Cough  . Micardis [Telmisartan] Other (See Comments)    dizziness    Social History:  The patient  reports that she has never smoked. She has never used smokeless tobacco. She reports that she drinks about 0.5 oz of alcohol per week. She reports that she does not use illicit drugs.   Family History  Problem Relation Age of Onset  . Anesthesia problems Neg Hx   . Hypotension Neg Hx   . Malignant hyperthermia Neg Hx   . Pseudochol deficiency Neg Hx     ROS:  Please see the history of present illness.   denies any bleeding, syncope, orthopnea, PND, rash. No chest pain   All other  systems reviewed and negative.   PHYSICAL EXAM: VS:  BP 130/80 mmHg  Pulse 78  Ht  (1.651 m)  Wt 236 lb (107.049 kg)  BMI 39.27 kg/m2 Well nourished, well developed, in no acute distress HEENT: normal, bruising over her nose, ecchymosis, EOMI Neck: no JVD, normal carotid upstroke, radiation of aortic murmur to carotids bilaterally  Cardiac:  normal S1, S2; RRR; 3/6 mid crescendo systolic murmur Lungs:  clear to auscultation bilaterally, no wheezing, rhonchi or rales Abd: soft, nontender, no hepatomegaly, no bruits, overweight Ext: no edema, 2+ distal pulses, overweight prior bilateral knee replacement, shoulder replacement Skin: warm and dry GU: deferred Neuro: no focal abnormalities noted, AAO x 3 MSK: Challenging to ambulate secondary to knee osteoarthritis.  EKG:  Previous-01/15/15-ectopic atrial rhythm, premature atrial contraction, left anterior fascicular block, poor R-wave progression, LVH. 08/25/13-sinus rhythm, 65, poor R wave progression, question possible limb lead reversal. Left posterior fascicular block possible.  ECHO: 05/15/14: Normal ejection fraction, moderate aortic stenosis  change from prior  ECHO: 07/16/15:  - Left ventricle: The cavity size was normal. Wall thickness was  increased in a pattern of mild LVH. Systolic function was normal.  The estimated ejection fraction was in the range of 55% to 60%.  Wall motion was normal; there were no regional wall motion  abnormalities. Doppler parameters are consistent with abnormal  left ventricular relaxation (grade 1 diastolic dysfunction).  Doppler parameters are consistent with high ventricular filling  pressure. - Aortic valve: Valve mobility was restricted. There was severe  stenosis. There was trivial regurgitation. - Ascending aorta: The ascending aorta was mildly dilated. - Mitral valve: Calcified annulus. There was mild to moderate  regurgitation. - Left atrium: The atrium was moderately  dilated. - Pulmonary arteries: Systolic pressure was mildly increased. PA  peak pressure: 32 mm Hg (S).  Impressions:  - Normal LV systolic function; grade 1 diastolic dysfunction;  elevated LV filling pressure; mild LVH; moderate LAE; calcfied  aortic valve with severe AS (mean gradient 41 mmHg) and trace AI;  mild to moderate MR; trace TR with mildly elevated pulmonary  pressure.   ASSESSMENT AND PLAN:  Severe aortic stenosis-echocardiogram reviewed. 41 mmHg mean gradient. Now severe from moderate to severe. Asymptomatic currently not describing any shortness of breath, angina, syncope. She will let me know immediately if she begins to have symptoms such as shortness of breath. We will continue to monitor her echocardiogram at least on a yearly basis or sooner if symptoms change. I  have explained to them the physiology, pathology behind aortic stenosis and natural course including surgical correction.   Osteoarthritis-Dr. Cleophas Dunker is orthopedist. Left knee replacement 5/15. Tolerating well.  Hypertension, essential-continue to treat. Medications reviewed.  Mechanical fall. Encouraged use of normal shoes. Careful ambulation  Memory impairment-she is on new medicine called Namzaric.   We'll see back in 6 months.  Signed, Donato Schultz, MD [facility]  07/18/2015 2:15 PM

## 2015-07-18 NOTE — Patient Instructions (Signed)
Your physician recommends that you continue on your current medications as directed. Please refer to the Current Medication list given to you today.  Your physician wants you to follow-up in: 6 months with Dr. Skains. You will receive a reminder letter in the mail two months in advance. If you don't receive a letter, please call our office to schedule the follow-up appointment.  

## 2015-07-19 NOTE — Telephone Encounter (Signed)
NA X multiple rings.  Will continue to attempt to reach pt.

## 2015-07-20 NOTE — Telephone Encounter (Signed)
Spoke with husband who states they has a message on their answering machine when they got home from pt's appt with Dr Anne Fu about her echo report.  Advised someone has been calling with results and didn't realize she had had an appt that day.  No further changes where made and husband denied further questions.

## 2015-08-21 DIAGNOSIS — H353122 Nonexudative age-related macular degeneration, left eye, intermediate dry stage: Secondary | ICD-10-CM | POA: Diagnosis not present

## 2015-08-21 DIAGNOSIS — H353212 Exudative age-related macular degeneration, right eye, with inactive choroidal neovascularization: Secondary | ICD-10-CM | POA: Diagnosis not present

## 2015-09-21 DIAGNOSIS — Q253 Supravalvular aortic stenosis: Secondary | ICD-10-CM | POA: Diagnosis not present

## 2015-09-21 DIAGNOSIS — Z6839 Body mass index (BMI) 39.0-39.9, adult: Secondary | ICD-10-CM | POA: Diagnosis not present

## 2015-09-21 DIAGNOSIS — R5383 Other fatigue: Secondary | ICD-10-CM | POA: Diagnosis not present

## 2015-09-21 DIAGNOSIS — Z79899 Other long term (current) drug therapy: Secondary | ICD-10-CM | POA: Diagnosis not present

## 2015-09-21 DIAGNOSIS — D696 Thrombocytopenia, unspecified: Secondary | ICD-10-CM | POA: Diagnosis not present

## 2015-09-21 DIAGNOSIS — E669 Obesity, unspecified: Secondary | ICD-10-CM | POA: Diagnosis not present

## 2015-09-21 DIAGNOSIS — I10 Essential (primary) hypertension: Secondary | ICD-10-CM | POA: Diagnosis not present

## 2015-09-21 DIAGNOSIS — G301 Alzheimer's disease with late onset: Secondary | ICD-10-CM | POA: Diagnosis not present

## 2016-01-01 DIAGNOSIS — E669 Obesity, unspecified: Secondary | ICD-10-CM | POA: Diagnosis not present

## 2016-01-01 DIAGNOSIS — I10 Essential (primary) hypertension: Secondary | ICD-10-CM | POA: Diagnosis not present

## 2016-01-01 DIAGNOSIS — Z23 Encounter for immunization: Secondary | ICD-10-CM | POA: Diagnosis not present

## 2016-01-01 DIAGNOSIS — Z6839 Body mass index (BMI) 39.0-39.9, adult: Secondary | ICD-10-CM | POA: Diagnosis not present

## 2016-01-18 ENCOUNTER — Encounter: Payer: Self-pay | Admitting: Cardiology

## 2016-01-21 ENCOUNTER — Encounter: Payer: Self-pay | Admitting: Cardiology

## 2016-01-21 ENCOUNTER — Telehealth: Payer: Self-pay | Admitting: *Deleted

## 2016-01-21 ENCOUNTER — Encounter (INDEPENDENT_AMBULATORY_CARE_PROVIDER_SITE_OTHER): Payer: Self-pay

## 2016-01-21 ENCOUNTER — Ambulatory Visit (INDEPENDENT_AMBULATORY_CARE_PROVIDER_SITE_OTHER): Payer: Medicare Other | Admitting: Cardiology

## 2016-01-21 VITALS — BP 136/86 | HR 84 | Ht 65.0 in | Wt 239.4 lb

## 2016-01-21 DIAGNOSIS — R413 Other amnesia: Secondary | ICD-10-CM | POA: Diagnosis not present

## 2016-01-21 DIAGNOSIS — I350 Nonrheumatic aortic (valve) stenosis: Secondary | ICD-10-CM | POA: Diagnosis not present

## 2016-01-21 DIAGNOSIS — I10 Essential (primary) hypertension: Secondary | ICD-10-CM

## 2016-01-21 DIAGNOSIS — W19XXXS Unspecified fall, sequela: Secondary | ICD-10-CM | POA: Diagnosis not present

## 2016-01-21 NOTE — Progress Notes (Signed)
[address] [place], [place]  [address] Phone: [phone] Fax:  [phone]   01/21/2016   ID:  [name], DOB [birth year], MRN [id number]  PCP:  Ginette Otto, MD   History of Present Illness: [name] is a 82 y.o. female with now severe aortic stenosis here for followup. She has been experiencing advancement in memory impairment. She tolerated left knee replacement in May of 2015. Nuclear stress test performed at that time was low risk overall.   Although she does not have any evidence of syncope or angina, she has been feeling more shortness of breath with activity. For instance when trying to keep up with her husband, she feels somewhat winded. Most of these questions and answers are being performed by the husband. She denies any history of prior stroke. No prior cardiovascular history such as MI.   She is originally from [place], her husband is from [place]. She is currently on Namzaric, memory impairment. This seems moderate and perhaps advancing.   She has fallen, mechanical falls. No syncope. When bends over falls forward.  Overall no significant changes.   Wt Readings from Last 3 Encounters:  01/21/16 239 lb 6.4 oz (108.6 kg)  07/18/15 236 lb (107 kg)  01/15/15 223 lb 12.8 oz (101.5 kg)     Past Medical History:  Diagnosis Date  . Aortic stenosis   . Arthritis   . Benign tumor of lung    removed  . Cancer (HCC)    ovarian   . Complication of anesthesia    trouble waking up-denies  . Heart murmur   . Hepatitis    as a child" Liver infection", yellow jauncice  . History of kidney stones   . History of nephrolithiasis    as child  . Hypertension    on treatment  . Osteopenia   . Thrombocytopenia, unspecified (HCC)   . Venous insufficiency    only varicose veins    Past Surgical History:  Procedure Laterality Date  . ABDOMINAL HYSTERECTOMY     also oohorectomy ?side  . FRACTURE SURGERY     rt wrist , left hand    . JOINT REPLACEMENT  [id number]   rt and lft  . LUNG LOBECTOMY  2004   rt upper lobe  . REVISION TOTAL KNEE ARTHROPLASTY Left 10/04/2013   DR Cleophas Dunker  . SHOULDER HEMI-ARTHROPLASTY  06/03/2011   Procedure: SHOULDER HEMI-ARTHROPLASTY;  Surgeon: Valeria Batman, MD;  Location: [facility] OR;  Service: Orthopedics;  Laterality: Left;  Left Hemi Shoulder Hemi-Arthroplasty   . TOTAL KNEE ARTHROPLASTY     bilateral  . TOTAL KNEE REVISION Left 10/04/2013   Procedure: TOTAL KNEE REVISION ;  Surgeon: Valeria Batman, MD;  Location: [facility] OR;  Service: Orthopedics;  Laterality: Left;  LEFT TOTAL KNEE REVISION   . TOTAL SHOULDER ARTHROPLASTY  2005   right  . VASCULAR SURGERY  2005   varicose vein     Current Outpatient Prescriptions  Medication Sig Dispense Refill  . acetaminophen (TYLENOL) 325 MG tablet Take 2 tablets (650 mg total) by mouth every 6 (six) hours as needed for mild pain (or Fever >/= 101).    Marland Kitchen Apoaequorin (PREVAGEN PO) Take 1 tablet by mouth at bedtime.     . hydrochlorothiazide (MICROZIDE) 12.5 MG capsule Take 12.5 mg by mouth daily.     Marland Kitchen KRILL OIL PO Take by mouth daily.     . Multiple Vitamins-Minerals (PRESERVISION AREDS 2 PO)  Take 1 tablet by mouth daily.    Marland Kitchen NAMZARIC 28-10 MG CP24 Take 1 capsule by mouth daily.    . Naproxen Sod-Diphenhydramine (ALEVE PM PO) Take by mouth as needed.     . vitamin E (VITAMIN E) 200 UNIT capsule Take 600 Units by mouth daily.     No current facility-administered medications for this visit.     Allergies:    Allergies  Allergen Reactions  . Lisinopril Cough  . Micardis [Telmisartan] Other (See Comments)    dizziness    Social History:  The patient  reports that she has never smoked. She has never used smokeless tobacco. She reports that she drinks about 0.5 oz of alcohol per week . She reports that she does not use drugs.   Family History  Problem Relation Age of Onset  . Anesthesia problems Neg Hx   . Hypotension Neg Hx   . Malignant  hyperthermia Neg Hx   . Pseudochol deficiency Neg Hx     ROS:  Please see the history of present illness.   denies any bleeding, syncope, orthopnea, PND, rash. No chest pain   All other systems reviewed and negative.   PHYSICAL EXAM: VS:  BP 136/86   Pulse 84   Ht 5\' 5"  (1.651 m)   Wt 239 lb 6.4 oz (108.6 kg)   BMI 39.84 kg/m  Well nourished, well developed, in no acute distress HEENT: normal, bruising over her nose, ecchymosis, EOMI Neck: no JVD, normal carotid upstroke, radiation of aortic murmur to carotids bilaterally  Cardiac:  normal S1, S2; RRR; 3/6 mid crescendo systolic murmur Lungs:  clear to auscultation bilaterally, no wheezing, rhonchi or rales Abd: soft, nontender, no hepatomegaly, no bruits, overweight Ext: no edema, 2+ distal pulses, overweight prior bilateral knee replacement, shoulder replacement Skin: warm and dry GU: deferred Neuro: no focal abnormalities noted, AAO x 3 MSK: Challenging to ambulate secondary to knee osteoarthritis.  EKG:  EKG today 01/21/16-sinus rhythm, LAFB personally viewed-no significant change from prior-01/21/16-sinus rhythm, LAFB 01/15/15-ectopic atrial rhythm, premature atrial contraction, left anterior fascicular block, poor R-wave progression, LVH. 08/25/13-sinus rhythm, 65, poor R wave progression, question possible limb lead reversal. Left posterior fascicular block possible.   ECHO: 07/16/15:  - Left ventricle: The cavity size was normal. Wall thickness was  increased in a pattern of mild LVH. Systolic function was normal.  The estimated ejection fraction was in the range of 55% to 60%.  Wall motion was normal; there were no regional wall motion  abnormalities. Doppler parameters are consistent with abnormal  left ventricular relaxation (grade 1 diastolic dysfunction).  Doppler parameters are consistent with high ventricular filling  pressure. - Aortic valve: Valve mobility was restricted. There was severe  stenosis. There  was trivial regurgitation. - Ascending aorta: The ascending aorta was mildly dilated. - Mitral valve: Calcified annulus. There was mild to moderate  regurgitation. - Left atrium: The atrium was moderately dilated. - Pulmonary arteries: Systolic pressure was mildly increased. PA  peak pressure: 32 mm Hg (S).  Impressions:  - Normal LV systolic function; grade 1 diastolic dysfunction;  elevated LV filling pressure; mild LVH; moderate LAE; calcfied  aortic valve with severe AS (mean gradient 41 mmHg) and trace AI;  mild to moderate MR; trace TR with mildly elevated pulmonary  pressure.   ASSESSMENT AND PLAN:  Severe aortic stenosis-echocardiogram reviewed. 41 mmHg mean gradient. Now severe from moderate to severe. She is starting to have some symptoms of shortness of breath.  I  have explained to them the physiology, pathology behind aortic stenosis and natural course including surgical correction. I would like for her to discuss this with cardiothoracic surgery in consultation. I think that her memory impairment seems to be advancing and she may not be an optimal candidate for replacement. I think it would be wise however to explore all options.  Osteoarthritis-Dr. Cleophas DunkerWhitfield is orthopedist. Left knee replacement 5/15. Tolerating well.  Hypertension, essential-continue to treat. Medications reviewed.  Mechanical fall. No recent falls-Encouraged use of normal shoes. Careful ambulation  Memory impairment-she is on new medicine called Namzaric. Dr. Pete GlatterStoneking has been monitoring. Her husband reports that this has been advancing.  We'll see back in 6 months or sooner if necessary. Appreciate cardiothoracic surgery consultation.  Signed, Donato SchultzMark Adwoa Axe, MD [facility]  01/21/2016 11:46 AM

## 2016-01-21 NOTE — Telephone Encounter (Signed)
Received request for TAVR consult. Pt can be scheduled to see Dr. Clifton James on 01/25/16 at 11:30 or 2:00.  I placed call to pt and left message to call back

## 2016-01-21 NOTE — Patient Instructions (Signed)
Medication Instructions:  The current medical regimen is effective;  continue present plan and medications.  You have been referred to cardiothoracic a surgeon to discuss Aortic Stenosis treatment options.  Follow-Up: Follow up in 6 months with Dr. Anne FuSkains.  You will receive a letter in the mail 2 months before you are due.  Please call us when you receive this letter to schedule your follow up appointment.  If you need a refill on your cardiac medications before your next appointment, please call your pharmacy.  Thank you for choosing [facility]!!      Aortic Stenosis Aortic stenosis is a narrowing of the aortic valve. The aortic valve is a gate-like structure that is located between the lower left chamber of the heart (left ventricle) and the blood vessel that leads away from the heart (aorta). When the aortic valve is narrowed, it does not open all the way. This makes it hard for the heart to pump blood into the aorta and causes the heart to work harder. The extra work can weaken the heart over time and lead to heart failure. CAUSES  Causes of aortic stenosis include:  Calcium deposits on the aortic valve that have made the valve stiff. This condition generally affects those over the age of 80. It is the most common cause of aortic stenosis.  A birth defect.  Rheumatic fever. This is a problem that may occur after a strep throat infection that was not treated adequately. Rheumatic fever can cause permanent damage to heart valves. SIGNS AND SYMPTOMS  People with aortic stenosis usually have no symptoms until the condition becomes severe. It may take 10-20 years for mild or moderate aortic stenosis to become severe. Symptoms may include:   Shortness of breath, especially with physical activity.   Feeling weak and tired (fatigued) or getting tired easily.  Chest discomfort (angina). This may occur with minimal activity if the aortic stenosis is severe.  An irregular or  faster-than-normal heartbeat.  Dizziness or fainting that happens with exertion or after taking certain heart medicines (such as nitroglycerin). DIAGNOSIS  Aortic stenosis is usually diagnosed with a physical exam and with a type of imaging test called echocardiography. During echocardiography, sound waves are used to evaluate how blood flows through the heart. If your health care provider suspects aortic stenosis but the test does not clearly show it, a procedure called cardiac catheterization may be done to diagnose the condition. Tests may also be done to evaluate heart function. They may include:  Electrocardiography. During this test, the electrical impulses of the heart are recorded while you are lying down and sticky patches are placed on your chest, arms, and legs.  Stress tests. There is more than one type of stress test. If a stress test is needed, ask your health care provider about which type is best for you.  Blood tests. TREATMENT  Treatment depends on how severe the aortic stenosis is, your symptoms, and the problems it is causing.   Observation. If the aortic stenosis is mild, no treatment may be needed. However, you will need to have the condition checked regularly to make sure it is not getting worse or causing serious problems.  Surgery. Surgery to repair or replace the aortic valve is the most common treatment for aortic stenosis. Several types of surgeries are available. The most common are open-heart surgery and transcutaneous aortic valve replacement (TAVR). TAVR does not require that the chest be opened. It is usually performed on elderly patients and  those who are not able to have open-heart surgery.  Medicines. Medicines may be given to keep symptoms from getting worse. Medicines cannot reverse aortic stenosis. HOME CARE INSTRUCTIONS   You may need to avoid certain types of physical activity. If your aortic stenosis is mild, you may need to avoid only strenuous  activity. The more severe your aortic stenosis, the more activities you will need to avoid. Talk with your health care provider about the types of activity you should avoid.  Take medicines only as directed by your health care provider.  If you are a woman with aortic valve stenosis and want to get pregnant, talk to your health care provider before you become pregnant.  If you are a woman with aortic valve stenosis and are pregnant, keep all follow-up visits with all recommended health care providers.  Keep all follow-up visits for tests, exams, and treatments as directed by your health care provider. SEEK IMMEDIATE MEDICAL CARE IF:  You develop chest pain or tightness.   You develop shortness of breath or difficulty breathing.   You develop light-headedness or faint.   It feels like your heartbeat is irregular or faster than normal.  You have a fever.   This information is not intended to replace advice given to you by your health care provider. Make sure you discuss any questions you have with your health care provider.   Document Released: 01/11/2003 Document Revised: 01/03/2015 Document Reviewed: 04/08/2012 Elsevier Interactive Patient Education [employer]

## 2016-01-21 NOTE — Addendum Note (Signed)
Addended by: Sharin Grave on: 01/21/2016 12:08 PM   Modules accepted: Orders

## 2016-01-22 NOTE — Telephone Encounter (Signed)
Spoke with patient to schedule appointment with Dr. Clifton James for TAVR consult.  She states she does not know what I am talking about.  I asked if Dr. Anne Fu talked with her about this procedure and she states she has no idea.  She states her husband is not at home with her.  I advised that we will call back later to speak with her husband.  She states that would be good.

## 2016-01-22 NOTE — Telephone Encounter (Signed)
F/u ° ° ° ° ° °Pt returning nurse call.  °

## 2016-01-22 NOTE — Telephone Encounter (Signed)
I spoke with the [name]'s husband and made him aware that Dr Judd Gaudier would like the [name] evaluated for TAVR. TAVR consult scheduled with Dr Clifton James on 01/25/16 at 2:00.

## 2016-01-22 NOTE — Telephone Encounter (Signed)
Spoke with patient who states her husband is not at home right now and that we should call back later to speak with him.

## 2016-01-25 ENCOUNTER — Ambulatory Visit (INDEPENDENT_AMBULATORY_CARE_PROVIDER_SITE_OTHER): Payer: Medicare Other | Admitting: Cardiovascular Disease

## 2016-01-25 ENCOUNTER — Encounter: Payer: Self-pay | Admitting: Cardiovascular Disease

## 2016-01-25 VITALS — BP 120/80 | HR 76 | Ht 65.0 in | Wt 241.2 lb

## 2016-01-25 DIAGNOSIS — I350 Nonrheumatic aortic (valve) stenosis: Secondary | ICD-10-CM

## 2016-01-25 NOTE — Progress Notes (Signed)
Chief Complaint  Patient presents with  . Aortic Stenosis    TAVR consult    History of Present Illness: 82 yo female with history of severe aortic stenosis, HTN and dementia who is here today as a new patient in the valve clinic for discussion regarding her aortic stenosis. She is followed in our office by Dr. Anne FuSkains. She is here today with her daughter. Her husband stayed in the waiting room today. She is known to have moderate AS and has had this followed by Dr. Anne FuSkains for several years. She was seen by Dr. Anne FuSkains this week and c/o worsened dyspnea with exertion. She denied chest pain, dizziness, near syncope. Echo 3/201/7 with normal LV systolic function. The aortic valve leaflets are thickened and calcified with limited mobility. The mean gradient is 41 mmHg and the peak gradient is 66 mmHg. The AVA is around 0.9 cm2. She is not known to have CAD.   She has done well until the last few months when she has had progressively worsening dyspnea with exertion. She denies chest pain or pressure. She denies dizziness or syncope. She does fall forward wean leaning over which is more of a balance issue. She was swimming several days per week for exercise up until two years ago. She has not had the energy to swim over the last few years. She has told her daughter that she feels weaker over the past year. She has not been allowed to drive by her family for 1 year due to "forgetting where she is going". She has by report had many memory issues over the last year. Her daughter thinks this may have stabilized on the current medical therapy but she clearly has episodes during the day where she is confused and disoriented. Her daughter states that she tried to feed the dog today but she has not had a dog in years. She is on Namzaric therapy for dementia. She has chronic LE edema, unchanged. She had been a very functional person until the last 2 years and has been mostly limited by progressive cognitive deficits.    Primary Care Physician: Ginette OttoSTONEKING,HAL THOMAS, MD  Past Medical History:  Diagnosis Date  . Aortic stenosis   . Arthritis   . Complication of anesthesia    trouble waking up-denies  . Heart murmur   . Hepatitis    as a child" Liver infection", yellow jauncice  . History of kidney stones   . History of nephrolithiasis    as child  . Hypertension    on treatment  . Osteopenia   . Thrombocytopenia, unspecified (HCC)   . Venous insufficiency    only varicose veins    Past Surgical History:  Procedure Laterality Date  . ABDOMINAL HYSTERECTOMY     also oohorectomy ?side  . FRACTURE SURGERY     rt wrist , left hand  . JOINT REPLACEMENT  [id number]   rt and lft  . LUNG LOBECTOMY  2004   rt upper lobe  . REVISION TOTAL KNEE ARTHROPLASTY Left 10/04/2013   DR Cleophas DunkerWHITFIELD  . SHOULDER HEMI-ARTHROPLASTY  06/03/2011   Procedure: SHOULDER HEMI-ARTHROPLASTY;  Surgeon: Valeria BatmanPeter W Whitfield, MD;  [facility];  Service: Orthopedics;  Laterality: Left;  Left Hemi Shoulder Hemi-Arthroplasty   . TOTAL KNEE ARTHROPLASTY     bilateral  . TOTAL KNEE REVISION Left 10/04/2013   Procedure: TOTAL KNEE REVISION ;  Surgeon: Valeria BatmanPeter W Whitfield, MD;  [facility];  Service: Orthopedics;  Laterality: Left;  LEFT TOTAL  KNEE REVISION   . TOTAL SHOULDER ARTHROPLASTY  2005   right  . VASCULAR SURGERY  2005   varicose vein     Current Outpatient Prescriptions  Medication Sig Dispense Refill  . acetaminophen (TYLENOL) 325 MG tablet Take 2 tablets (650 mg total) by mouth every 6 (six) hours as needed for mild pain (or Fever >/= 101).    Marland Kitchen Apoaequorin (PREVAGEN PO) Take 1 tablet by mouth at bedtime.     . hydrochlorothiazide (MICROZIDE) 12.5 MG capsule Take 12.5 mg by mouth daily.     Marland Kitchen KRILL OIL PO Take 1 Dose by mouth daily.     . Multiple Vitamins-Minerals (PRESERVISION AREDS 2 PO) Take 1 tablet by mouth daily.    Marland Kitchen NAMZARIC 28-10 MG CP24 Take 1 capsule by mouth daily.    . Naproxen Sod-Diphenhydramine  (ALEVE PM PO) Take 1 tablet by mouth 2 (two) times daily as needed (pain).     . vitamin E (VITAMIN E) 200 UNIT capsule Take 600 Units by mouth daily.     No current facility-administered medications for this visit.     Allergies  Allergen Reactions  . Lisinopril Cough  . Micardis [Telmisartan] Other (See Comments)    dizziness    Social History   Social History  . Marital status: Married    Spouse name: N/A  . Number of children: N/A  . Years of education: N/A   Occupational History  . Not on file.   Social History Main Topics  . Smoking status: Never Smoker  . Smokeless tobacco: Never Used  . Alcohol use 0.5 oz/week    1 drink(s) per week  . Drug use: No  . Sexual activity: Not on file   Other Topics Concern  . Not on file   Social History Narrative  . No narrative on file    Family History  Problem Relation Age of Onset  . Diabetes Brother   . Anesthesia problems Neg Hx   . Hypotension Neg Hx   . Malignant hyperthermia Neg Hx   . Pseudochol deficiency Neg Hx     Review of Systems:  As stated in the HPI and otherwise negative.   BP 120/80   Pulse 76   Ht 5\' 5"  (1.651 m)   Wt 241 lb 3.2 oz (109.4 kg)   BMI 40.14 kg/m   Physical Examination: General: Well developed, well nourished, NAD She is oriented to person and place today.  HEENT: OP clear, mucus membranes moist  SKIN: warm, dry. No rashes. Neuro: No focal deficits  Musculoskeletal: Muscle strength 5/5 all ext  Psychiatric: Mood and affect normal  Neck: No JVD, no carotid bruits, no thyromegaly, no lymphadenopathy.  Lungs:Clear bilaterally, no wheezes, rhonci, crackles Cardiovascular: Regular rate and rhythm. Loud harsh systolic murmur is noted. No gallops or rubs. Abdomen:Soft. Bowel sounds present. Non-tender.  Extremities: Trace bilateral lower extremity edema.   Echo 07/16/15: Left ventricle: The cavity size was normal. Wall thickness was   increased in a pattern of mild LVH. Systolic  function was normal.   The estimated ejection fraction was in the range of 55% to 60%.   Wall motion was normal; there were no regional wall motion   abnormalities. Doppler parameters are consistent with abnormal   left ventricular relaxation (grade 1 diastolic dysfunction).   Doppler parameters are consistent with high ventricular filling   pressure. - Aortic valve:   Trileaflet; moderately calcified leaflets. Valve mobility was restricted.  Doppler:   There  was severe stenosis. There was trivial regurgitation.    VTI ratio of LVOT to aortic valve: 0.25. Valve area (VTI): 0.96 cm^2. Indexed valve area (VTI): 0.44 cm^2/m^2. Peak velocity ratio of LVOT to aortic valve: 0.26. Valve area (Vmax): 0.98 cm^2. Indexed valve area (Vmax): 0.44 cm^2/m^2. Mean velocity ratio of LVOT to aortic valve: 0.23. Valve area (Vmean): 0.88 cm^2. Indexed valve area (Vmean): 0.4 cm^2/m^2.   Mean gradient (S): 41 mm Hg. Peak gradient (S): 66 mm Hg. - Ascending aorta: The ascending aorta was mildly dilated. - Mitral valve: Calcified annulus. There was mild to moderate   regurgitation. - Left atrium: The atrium was moderately dilated. - Pulmonary arteries: Systolic pressure was mildly increased. PA   peak pressure: 32 mm Hg (S).  Impressions:  - Normal LV systolic function; grade 1 diastolic dysfunction;   elevated LV filling pressure; mild LVH; moderate LAE; calcfied   aortic valve with severe AS (mean gradient 41 mmHg) and trace AI;   mild to moderate MR; trace TR with mildly elevated pulmonary   pressure.  EKG:  EKG is not ordered today. The ekg from 01/21/16 is reviewed and shows NSR, LVH, LAFB.    Recent Labs: No results found for requested labs within last 8760 hours.   Lipid Panel No results found for: CHOL, TRIG, HDL, CHOLHDL, VLDL, LDLCALC, LDLDIRECT   Wt Readings from Last 3 Encounters:  01/25/16 241 lb 3.2 oz (109.4 kg)  01/21/16 239 lb 6.4 oz (108.6 kg)  07/18/15 236 lb (107 kg)      Other studies Reviewed: Additional studies/ records that were reviewed today include: Echo images Review of the above records demonstrates:   Assessment and Plan:   1. Severe aortic valve stenosis: She has symptomatic aortic valve stenosis although her symptoms are relatively mild at this point. I have personally reviewed the echo images. The aortic valve is thickened, calcified with limited leaflet mobility. I think she would benefit clinically from AVR but given her dementia, any procedures will have to be approached with caution. Given advanced age, she is not a good candidate for conventional AVR by surgical approach. She would be a candidate for TAVR but I suspect that her dementia is more severe than is apparent while seeing her in an office visit. Her daughter confirms this. Her husband is not with her today. I have reviewed the TAVR procedure in detail today with the patient and her daughter. I would consider this in the future but I am concerned that she would have worsening of her cognitive deficits with general anesthesia and any embolic events during the TAVR procedure my lead to profound worsening of her dementia. I will plan to see her back in 6 weeks with her other daughter present and at that time, will re-evaluate her cognitive deficits and make a plan regarding treatment of her AS with TAVR vs palliation. I have discussed this with Dr. Anne Fu today as well.   I have spent 60 minutes today in both review of her records, echo images, documentation and direct face to face time with this patient.   Current medicines are reviewed at length with the patient today.  The patient does not have concerns regarding medicines.  The following changes have been made:  no change  Labs/ tests ordered today include:  No orders of the defined types were placed in this encounter.   Disposition:   FU with me in 6 weeks  Signed, Verne Carrow, MD 01/25/2016 5:46 PM  [facility] [place], [place], [place]  [address] Phone: [phone]; Fax: [phone]

## 2016-01-25 NOTE — Patient Instructions (Signed)
Medication Instructions:   Your physician recommends that you continue on your current medications as directed. Please refer to the Current Medication list given to you today.   Labwork: none  Testing/Procedures: none  Follow-Up: You have a follow up appointment scheduled with Dr. Clifton James on November 22,2017 at 4:00  Any Other Special Instructions Will Be Listed Below (If Applicable).     If you need a refill on your cardiac medications before your next appointment, please call your pharmacy.

## 2016-02-20 DIAGNOSIS — H353121 Nonexudative age-related macular degeneration, left eye, early dry stage: Secondary | ICD-10-CM | POA: Diagnosis not present

## 2016-02-20 DIAGNOSIS — H353212 Exudative age-related macular degeneration, right eye, with inactive choroidal neovascularization: Secondary | ICD-10-CM | POA: Diagnosis not present

## 2016-03-11 DIAGNOSIS — R5383 Other fatigue: Secondary | ICD-10-CM | POA: Diagnosis not present

## 2016-03-11 DIAGNOSIS — Z79899 Other long term (current) drug therapy: Secondary | ICD-10-CM | POA: Diagnosis not present

## 2016-03-11 DIAGNOSIS — R718 Other abnormality of red blood cells: Secondary | ICD-10-CM | POA: Diagnosis not present

## 2016-03-11 DIAGNOSIS — G301 Alzheimer's disease with late onset: Secondary | ICD-10-CM | POA: Diagnosis not present

## 2016-03-11 DIAGNOSIS — I10 Essential (primary) hypertension: Secondary | ICD-10-CM | POA: Diagnosis not present

## 2016-03-11 DIAGNOSIS — Q253 Supravalvular aortic stenosis: Secondary | ICD-10-CM | POA: Diagnosis not present

## 2016-03-11 DIAGNOSIS — F321 Major depressive disorder, single episode, moderate: Secondary | ICD-10-CM | POA: Diagnosis not present

## 2016-03-19 ENCOUNTER — Ambulatory Visit (INDEPENDENT_AMBULATORY_CARE_PROVIDER_SITE_OTHER): Payer: Medicare Other | Admitting: Cardiovascular Disease

## 2016-03-19 ENCOUNTER — Encounter: Payer: Self-pay | Admitting: Cardiovascular Disease

## 2016-03-19 VITALS — BP 122/70 | HR 88 | Ht 65.0 in | Wt 244.4 lb

## 2016-03-19 DIAGNOSIS — I350 Nonrheumatic aortic (valve) stenosis: Secondary | ICD-10-CM

## 2016-03-19 NOTE — Progress Notes (Signed)
Chief Complaint  Patient presents with  . Aortic Stenosis    discuss TAVR    History of Present Illness: 82 yo female with history of severe aortic stenosis, HTN and dementia who is here today for follow up and further discussion regarding her aortic stenosis. I saw her as a new patient in the valve clinic 01/25/16 for discussion regarding her aortic stenosis. She is followed in our office by Dr. Anne Fu. At the first appointment, she was with her daughter. Her husband stayed in the waiting room. She is known to have moderate AS and has had this followed by Dr. Anne Fu for several years. She was seen by Dr. Anne Fu on 01/21/16 and c/o worsened dyspnea with exertion. She denied chest pain, dizziness, near syncope. Echo 3/201/7 with normal LV systolic function. The aortic valve leaflets are thickened and calcified with limited mobility. The mean gradient is 41 mmHg and the peak gradient is 66 mmHg. The AVA is around 0.9 cm2. She is not known to have CAD. She described progressive dyspnea with exertion for several months but no chest pain or pressure, no dizziness, near syncope or syncope. She described falling forward when leaning over which is more of a balance issue. She was swimming several days per week for exercise up until two years ago. She has not had the energy to swim over the last few years. She has told her daughter that she feels weaker over the past year. She has not been allowed to drive by her family for 1 year due to "forgetting where she is going". She has by report had many memory issues over the last year. Her daughter thinks this may have stabilized on the current medical therapy but she clearly has episodes during the day where she is confused and disoriented. Her daughter states that she tried to feed the dog today but she has not had a dog in years. She is on Namzaric therapy for dementia. She has chronic LE edema, unchanged. She had been a very functional person until the last 2 years and  has been mostly limited by progressive cognitive deficits.   At the first visit here, I reviewed the TAVR procedure in detail. Given her dementia, I planned to see her back today with more family members to discuss the plan for proceeding with invasive procedures. She is here today with her other daughter and son in law. She continues to have dyspnea with minimal exertion such as walking across the room. No chest pain, dizziness, near syncope or syncope.   Primary Care Physician: Ginette Otto, MD  Past Medical History:  Diagnosis Date  . Aortic stenosis   . Arthritis   . Complication of anesthesia    trouble waking up-denies  . Heart murmur   . Hepatitis    as a child" Liver infection", yellow jauncice  . History of kidney stones   . History of nephrolithiasis    as child  . Hypertension    on treatment  . Osteopenia   . Thrombocytopenia, unspecified   . Venous insufficiency    only varicose veins    Past Surgical History:  Procedure Laterality Date  . ABDOMINAL HYSTERECTOMY     also oohorectomy ?side  . FRACTURE SURGERY     rt wrist , left hand  . JOINT REPLACEMENT  [id number]   rt and lft  . LUNG LOBECTOMY  2004   rt upper lobe  . REVISION TOTAL KNEE ARTHROPLASTY Left 10/04/2013   DR Cleophas Dunker  .  SHOULDER HEMI-ARTHROPLASTY  06/03/2011   Procedure: SHOULDER HEMI-ARTHROPLASTY;  Surgeon: Valeria Batman, MD;  Location: [facility] OR;  Service: Orthopedics;  Laterality: Left;  Left Hemi Shoulder Hemi-Arthroplasty   . TOTAL KNEE ARTHROPLASTY     bilateral  . TOTAL KNEE REVISION Left 10/04/2013   Procedure: TOTAL KNEE REVISION ;  Surgeon: Valeria Batman, MD;  Location: [facility] OR;  Service: Orthopedics;  Laterality: Left;  LEFT TOTAL KNEE REVISION   . TOTAL SHOULDER ARTHROPLASTY  2005   right  . VASCULAR SURGERY  2005   varicose vein     Current Outpatient Prescriptions  Medication Sig Dispense Refill  . acetaminophen (TYLENOL) 325 MG tablet Take 2 tablets (650 mg total) by  mouth every 6 (six) hours as needed for mild pain (or Fever >/= 101).    Marland Kitchen Apoaequorin (PREVAGEN PO) Take 1 tablet by mouth at bedtime.     Marland Kitchen KRILL OIL PO Take 1 Dose by mouth daily.     . Multiple Vitamins-Minerals (PRESERVISION AREDS 2 PO) Take 1 tablet by mouth daily.    Marland Kitchen NAMZARIC 28-10 MG CP24 Take 1 capsule by mouth daily.    . Naproxen Sod-Diphenhydramine (ALEVE PM PO) Take 1 tablet by mouth 2 (two) times daily as needed (pain).     . vitamin E (VITAMIN E) 200 UNIT capsule Take 600 Units by mouth daily.     No current facility-administered medications for this visit.     Allergies  Allergen Reactions  . Lisinopril Cough  . Micardis [Telmisartan] Other (See Comments)    dizziness    Social History   Social History  . Marital status: Married    Spouse name: N/A  . Number of children: N/A  . Years of education: N/A   Occupational History  . Not on file.   Social History Main Topics  . Smoking status: Never Smoker  . Smokeless tobacco: Never Used  . Alcohol use 0.5 oz/week    1 drink(s) per week  . Drug use: No  . Sexual activity: Not on file   Other Topics Concern  . Not on file   Social History Narrative  . No narrative on file    Family History  Problem Relation Age of Onset  . Diabetes Brother   . Anesthesia problems Neg Hx   . Hypotension Neg Hx   . Malignant hyperthermia Neg Hx   . Pseudochol deficiency Neg Hx     Review of Systems:  As stated in the HPI and otherwise negative.   BP 122/70   Pulse 88   Ht 5\' 5"  (1.651 m)   Wt 244 lb 6.4 oz (110.9 kg)   BMI 40.67 kg/m   Physical Examination: General: Well developed, well nourished, NAD She is oriented to person and place today.  HEENT: OP clear, mucus membranes moist  SKIN: warm, dry. No rashes. Neuro: No focal deficits  Musculoskeletal: Muscle strength 5/5 all ext  Psychiatric: Mood and affect normal  Neck: No JVD, no carotid bruits, no thyromegaly, no lymphadenopathy.  Lungs:Clear  bilaterally, no wheezes, rhonci, crackles Cardiovascular: Regular rate and rhythm. Loud harsh systolic murmur is noted. No gallops or rubs. Abdomen:Soft. Bowel sounds present. Non-tender.  Extremities: Trace bilateral lower extremity edema.   Echo 07/16/15: Left ventricle: The cavity size was normal. Wall thickness was   increased in a pattern of mild LVH. Systolic function was normal.   The estimated ejection fraction was in the range of 55% to 60%.   Wall  motion was normal; there were no regional wall motion   abnormalities. Doppler parameters are consistent with abnormal   left ventricular relaxation (grade 1 diastolic dysfunction).   Doppler parameters are consistent with high ventricular filling   pressure. - Aortic valve:   Trileaflet; moderately calcified leaflets. Valve mobility was restricted.  Doppler:   There was severe stenosis. There was trivial regurgitation.    VTI ratio of LVOT to aortic valve: 0.25. Valve area (VTI): 0.96 cm^2. Indexed valve area (VTI): 0.44 cm^2/m^2. Peak velocity ratio of LVOT to aortic valve: 0.26. Valve area (Vmax): 0.98 cm^2. Indexed valve area (Vmax): 0.44 cm^2/m^2. Mean velocity ratio of LVOT to aortic valve: 0.23. Valve area (Vmean): 0.88 cm^2. Indexed valve area (Vmean): 0.4 cm^2/m^2.   Mean gradient (S): 41 mm Hg. Peak gradient (S): 66 mm Hg. - Ascending aorta: The ascending aorta was mildly dilated. - Mitral valve: Calcified annulus. There was mild to moderate   regurgitation. - Left atrium: The atrium was moderately dilated. - Pulmonary arteries: Systolic pressure was mildly increased. PA   peak pressure: 32 mm Hg (S).  Impressions:  - Normal LV systolic function; grade 1 diastolic dysfunction;   elevated LV filling pressure; mild LVH; moderate LAE; calcfied   aortic valve with severe AS (mean gradient 41 mmHg) and trace AI;   mild to moderate MR; trace TR with mildly elevated pulmonary   pressure.  EKG:  EKG is not ordered  today. The ekg from 01/21/16 is reviewed and shows NSR, LVH, LAFB.    Recent Labs: No results found for requested labs within last 8760 hours.   Lipid Panel No results found for: CHOL, TRIG, HDL, CHOLHDL, VLDL, LDLCALC, LDLDIRECT   Wt Readings from Last 3 Encounters:  03/19/16 244 lb 6.4 oz (110.9 kg)  01/25/16 241 lb 3.2 oz (109.4 kg)  01/21/16 239 lb 6.4 oz (108.6 kg)     Other studies Reviewed: Additional studies/ records that were reviewed today include: Echo images Review of the above records demonstrates:   STS Risk Score:  Risk of Mortality: 3.132%  Morbidity or Mortality: 15.34%  Long Length of Stay: 7.022%  Short Length of Stay: 27.158%  Permanent Stroke: 2.033%  Prolonged Ventilation: 10.539%  DSW Infection: 0.444%    Assessment and Plan:   1. Severe aortic valve stenosis: She has symptomatic aortic valve stenosis although her symptoms are relatively mild at this point. Her dyspnea has progressed over the last 6 months but she can still perform her activities of daily living. I have personally reviewed the echo images. The aortic valve is thickened, calcified with limited leaflet mobility. I think she would benefit clinically from AVR but given her dementia, any procedures will have to be approached with caution. Given advanced age, she is not a good candidate for conventional AVR by surgical approach. She would be a candidate for TAVR but after meeting with all of her family, it is apparent that her dementia is at least moderate. Both daughters confirm this. I have reviewed the TAVR procedure in detail again today with the patient, her daughter and son in law. I am concerned that she would have worsening of her cognitive deficits with general anesthesia and any embolic events during the TAVR procedure my lead to profound worsening of her dementia.  We have decided today that she will not be a candidate for TAVR given her cognitive deficits. She is agreeable to this plan. Her  symptoms will be treated medically as they arise.  I have spent 30 minutes today in both review of her records, echo images, documentation and direct face to face time with this patient.   Current medicines are reviewed at length with the patient today.  The patient does not have concerns regarding medicines.  The following changes have been made:  no change  Labs/ tests ordered today include:  No orders of the defined types were placed in this encounter.   Disposition:   FU with Dr. Anne Fu as planned.   Signed, Verne Carrow, MD 03/19/2016 4:49 PM    [facility] [address] [place], [place], [place]  [address] Phone: [phone]; Fax: [phone]

## 2016-03-19 NOTE — Patient Instructions (Signed)
Medication Instructions:  Your physician recommends that you continue on your current medications as directed. Please refer to the Current Medication list given to you today.   Labwork: none  Testing/Procedures: none  Follow-Up: Follow up with Dr. Anne Fu as planned.   Any Other Special Instructions Will Be Listed Below (If Applicable).     If you need a refill on your cardiac medications before your next appointment, please call your pharmacy.

## 2016-04-07 DIAGNOSIS — I10 Essential (primary) hypertension: Secondary | ICD-10-CM | POA: Diagnosis not present

## 2016-04-07 DIAGNOSIS — F320 Major depressive disorder, single episode, mild: Secondary | ICD-10-CM | POA: Diagnosis not present

## 2016-04-07 DIAGNOSIS — G301 Alzheimer's disease with late onset: Secondary | ICD-10-CM | POA: Diagnosis not present

## 2016-04-29 DIAGNOSIS — I350 Nonrheumatic aortic (valve) stenosis: Secondary | ICD-10-CM | POA: Diagnosis not present

## 2016-04-29 DIAGNOSIS — R0609 Other forms of dyspnea: Secondary | ICD-10-CM | POA: Diagnosis not present

## 2016-04-29 DIAGNOSIS — R5383 Other fatigue: Secondary | ICD-10-CM | POA: Diagnosis not present

## 2016-05-26 ENCOUNTER — Encounter: Payer: Self-pay | Admitting: Cardiology

## 2016-05-26 ENCOUNTER — Encounter (INDEPENDENT_AMBULATORY_CARE_PROVIDER_SITE_OTHER): Payer: Self-pay

## 2016-05-26 ENCOUNTER — Ambulatory Visit (INDEPENDENT_AMBULATORY_CARE_PROVIDER_SITE_OTHER): Payer: Medicare Other | Admitting: Cardiology

## 2016-05-26 VITALS — BP 128/86 | HR 62 | Ht 62.0 in | Wt 234.4 lb

## 2016-05-26 DIAGNOSIS — IMO0001 Reserved for inherently not codable concepts without codable children: Secondary | ICD-10-CM

## 2016-05-26 DIAGNOSIS — E669 Obesity, unspecified: Secondary | ICD-10-CM

## 2016-05-26 DIAGNOSIS — I10 Essential (primary) hypertension: Secondary | ICD-10-CM | POA: Diagnosis not present

## 2016-05-26 DIAGNOSIS — I350 Nonrheumatic aortic (valve) stenosis: Secondary | ICD-10-CM

## 2016-05-26 DIAGNOSIS — R413 Other amnesia: Secondary | ICD-10-CM

## 2016-05-26 NOTE — Progress Notes (Signed)
[address] [place], [place]  [address] Phone: [phone] Fax:  [phone]   05/26/2016   ID:  [name], DOB March 23, 1931, MRN [id number]  PCP:  Ginette Otto, MD   History of Present Illness: [name] is a 83 y.o. female with severe aortic stenosis here for followup.  She was seen by Dr. Clifton James and TAVR team, not felt to be a candidate for TAVR.  She has been experiencing advancement in memory impairment. She tolerated left knee replacement in May of 2015. Nuclear stress test performed at that time was low risk overall.   Although she does not have any evidence of syncope or angina, she has been feeling more shortness of breath with activity. For instance when trying to keep up with her husband, she feels somewhat winded. Most of these questions and answers are being performed by the husband. She denies any history of prior stroke. No prior cardiovascular history such as MI. She is also struggling at times with sleep. She gets up in the morning than once to take a nap immediately.  She saw Dr. Donette Larry who gave her Lasix.  She is originally from [place], her husband is from [place]. She is currently on Namzaric, memory impairment. This seems moderate and perhaps advancing.   She has fallen, mechanical falls. No syncope. When bends over falls forward.  Overall no significant changes.   Wt Readings from Last 3 Encounters:  05/26/16 234 lb 6.4 oz (106.3 kg)  03/19/16 244 lb 6.4 oz (110.9 kg)  01/25/16 241 lb 3.2 oz (109.4 kg)     Past Medical History:  Diagnosis Date  . Aortic stenosis   . Arthritis   . Complication of anesthesia    trouble waking up-denies  . Heart murmur   . Hepatitis    as a child" Liver infection", yellow jauncice  . History of kidney stones   . History of nephrolithiasis    as child  . Hypertension    on treatment  . Osteopenia   . Thrombocytopenia, unspecified   . Venous insufficiency    only varicose  veins    Past Surgical History:  Procedure Laterality Date  . ABDOMINAL HYSTERECTOMY     also oohorectomy ?side  . FRACTURE SURGERY     rt wrist , left hand  . JOINT REPLACEMENT  9622,2979   rt and lft  . LUNG LOBECTOMY  2004   rt upper lobe  . REVISION TOTAL KNEE ARTHROPLASTY Left 10/04/2013   DR Cleophas Dunker  . SHOULDER HEMI-ARTHROPLASTY  06/03/2011   Procedure: SHOULDER HEMI-ARTHROPLASTY;  Surgeon: Valeria Batman, MD;  Location: [facility] OR;  Service: Orthopedics;  Laterality: Left;  Left Hemi Shoulder Hemi-Arthroplasty   . TOTAL KNEE ARTHROPLASTY     bilateral  . TOTAL KNEE REVISION Left 10/04/2013   Procedure: TOTAL KNEE REVISION ;  Surgeon: Valeria Batman, MD;  Location: [facility] OR;  Service: Orthopedics;  Laterality: Left;  LEFT TOTAL KNEE REVISION   . TOTAL SHOULDER ARTHROPLASTY  2005   right  . VASCULAR SURGERY  2005   varicose vein     Current Outpatient Prescriptions  Medication Sig Dispense Refill  . acetaminophen (TYLENOL) 325 MG tablet Take 2 tablets (650 mg total) by mouth every 6 (six) hours as needed for mild pain (or Fever >/= 101).    Marland Kitchen Apoaequorin (PREVAGEN PO) Take 1 tablet by mouth at bedtime.     . furosemide (LASIX) 20 MG tablet Take  20 mg by mouth daily.    Marland Kitchen KRILL OIL PO Take 1 Dose by mouth daily.     . Multiple Vitamins-Minerals (PRESERVISION AREDS 2 PO) Take 1 tablet by mouth daily.    Marland Kitchen NAMZARIC 28-10 MG CP24 Take 1 capsule by mouth daily.    . Naproxen Sod-Diphenhydramine (ALEVE PM PO) Take 1 tablet by mouth 2 (two) times daily as needed (pain).      No current facility-administered medications for this visit.     Allergies:    Allergies  Allergen Reactions  . Lisinopril Cough  . Micardis [Telmisartan] Other (See Comments)    dizziness    Social History:  The patient  reports that she has never smoked. She has never used smokeless tobacco. She reports that she drinks about 0.5 oz of alcohol per week . She reports that she does not use drugs.    Family History  Problem Relation Age of Onset  . Diabetes Brother   . Anesthesia problems Neg Hx   . Hypotension Neg Hx   . Malignant hyperthermia Neg Hx   . Pseudochol deficiency Neg Hx     ROS:  Please see the history of present illness.   denies any bleeding, syncope, orthopnea, PND, rash. No chest pain   All other systems reviewed and negative.   PHYSICAL EXAM: VS:  BP 128/86   Pulse 62   Ht 5\' 2"  (1.575 m)   Wt 234 lb 6.4 oz (106.3 kg)   LMP  (LMP Unknown)   BMI 42.87 kg/m  Well nourished, well developed, in no acute distress  HEENT: normal, bruising over her nose, ecchymosis, EOMI Neck: no JVD, normal carotid upstroke, radiation of aortic murmur to carotids bilaterally  Cardiac:  normal S1, S2; RRR; 3/6 mid crescendo systolic murmur  Lungs:  clear to auscultation bilaterally, no wheezing, rhonchi or rales  Abd: soft, nontender, no hepatomegaly, no bruits, overweight  Ext: no edema, 2+ distal pulses, overweight prior bilateral knee replacement, shoulder replacement Skin: warm and dry  GU: deferred Neuro: no focal abnormalities noted, alert but memory impaired.  MSK: Challenging to ambulate secondary to knee osteoarthritis.  EKG:  EKG today 01/21/16-sinus rhythm, LAFB personally viewed-no significant change from prior-01/21/16-sinus rhythm, LAFB 01/15/15-ectopic atrial rhythm, premature atrial contraction, left anterior fascicular block, poor R-wave progression, LVH. 08/25/13-sinus rhythm, 65, poor R wave progression, question possible limb lead reversal. Left posterior fascicular block possible.   ECHO: 07/16/15:  - Left ventricle: The cavity size was normal. Wall thickness was  increased in a pattern of mild LVH. Systolic function was normal.  The estimated ejection fraction was in the range of 55% to 60%.  Wall motion was normal; there were no regional wall motion  abnormalities. Doppler parameters are consistent with abnormal  left ventricular relaxation (grade 1  diastolic dysfunction).  Doppler parameters are consistent with high ventricular filling  pressure. - Aortic valve: Valve mobility was restricted. There was severe  stenosis. There was trivial regurgitation. - Ascending aorta: The ascending aorta was mildly dilated. - Mitral valve: Calcified annulus. There was mild to moderate  regurgitation. - Left atrium: The atrium was moderately dilated. - Pulmonary arteries: Systolic pressure was mildly increased. PA  peak pressure: 32 mm Hg (S).  Impressions:  - Normal LV systolic function; grade 1 diastolic dysfunction;  elevated LV filling pressure; mild LVH; moderate LAE; calcfied  aortic valve with severe AS (mean gradient 41 mmHg) and trace AI;  mild to moderate MR; trace TR with mildly  elevated pulmonary  pressure.   ASSESSMENT AND PLAN:  Severe aortic stenosis-echocardiogram reviewed. 41 mmHg mean gradient. Severe. She is  having some symptoms of shortness of breath. I agree with use of furosemide to help palliate. I have explained to them the physiology, pathology behind aortic stenosis and natural course.  Unfortunately, not TAVR or surgical candidate. Memory impairment seems to be advancing. As heart failure symptoms continue to progress, I think palliative measures would be helpful, perhaps home palliative care team or potentially hospice at one point.  Osteoarthritis-Dr. Cleophas Dunker is orthopedist. Left knee replacement 5/15. Tolerating well.  Hypertension, essential-continue to treat. Medications reviewed.  Mechanical fall. No recent falls-Encouraged use of normal shoes. Careful ambulation  Memory impairment-she is on new medicine called Namzaric. Dr. Pete Glatter has been monitoring. Her husband reports that this has been advancing.  Insomnia/fatigue - asked them to discuss insomnia portion with Dr. Pete Glatter. Unfortunately, I think fatigue, shortness of breath will continue to worsen as time progresses with her severe  aortic stenosis. Unfortunately there is no mechanical fix. I continue to encourage activity, movement.   We'll see back in 6 months or sooner if necessary.  Signed, Donato Schultz, MD [facility]  05/26/2016 8:14 AM

## 2016-05-26 NOTE — Patient Instructions (Signed)

## 2016-06-04 DIAGNOSIS — D696 Thrombocytopenia, unspecified: Secondary | ICD-10-CM | POA: Diagnosis not present

## 2016-06-04 DIAGNOSIS — G301 Alzheimer's disease with late onset: Secondary | ICD-10-CM | POA: Diagnosis not present

## 2016-06-04 DIAGNOSIS — Q253 Supravalvular aortic stenosis: Secondary | ICD-10-CM | POA: Diagnosis not present

## 2016-06-04 DIAGNOSIS — Z79899 Other long term (current) drug therapy: Secondary | ICD-10-CM | POA: Diagnosis not present

## 2016-06-04 DIAGNOSIS — I10 Essential (primary) hypertension: Secondary | ICD-10-CM | POA: Diagnosis not present

## 2016-07-11 DIAGNOSIS — G301 Alzheimer's disease with late onset: Secondary | ICD-10-CM | POA: Diagnosis not present

## 2016-07-11 DIAGNOSIS — I10 Essential (primary) hypertension: Secondary | ICD-10-CM | POA: Diagnosis not present

## 2016-07-11 DIAGNOSIS — E669 Obesity, unspecified: Secondary | ICD-10-CM | POA: Diagnosis not present

## 2016-07-11 DIAGNOSIS — I350 Nonrheumatic aortic (valve) stenosis: Secondary | ICD-10-CM | POA: Diagnosis not present

## 2016-07-11 DIAGNOSIS — Z6839 Body mass index (BMI) 39.0-39.9, adult: Secondary | ICD-10-CM | POA: Diagnosis not present

## 2016-07-24 ENCOUNTER — Ambulatory Visit: Payer: Medicare Other | Admitting: Cardiology

## 2016-08-19 ENCOUNTER — Encounter (INDEPENDENT_AMBULATORY_CARE_PROVIDER_SITE_OTHER): Payer: Self-pay

## 2016-08-19 ENCOUNTER — Ambulatory Visit (INDEPENDENT_AMBULATORY_CARE_PROVIDER_SITE_OTHER): Payer: Medicare Other | Admitting: Cardiology

## 2016-08-19 ENCOUNTER — Encounter: Payer: Self-pay | Admitting: Cardiology

## 2016-08-19 VITALS — BP 124/78 | HR 77 | Ht 62.0 in | Wt 233.6 lb

## 2016-08-19 DIAGNOSIS — Z96659 Presence of unspecified artificial knee joint: Secondary | ICD-10-CM | POA: Diagnosis not present

## 2016-08-19 DIAGNOSIS — E669 Obesity, unspecified: Secondary | ICD-10-CM

## 2016-08-19 DIAGNOSIS — I350 Nonrheumatic aortic (valve) stenosis: Secondary | ICD-10-CM

## 2016-08-19 DIAGNOSIS — IMO0001 Reserved for inherently not codable concepts without codable children: Secondary | ICD-10-CM

## 2016-08-19 NOTE — Patient Instructions (Signed)
Medication Instructions:  The current medical regimen is effective;  continue present plan and medications.  Follow-Up: Follow up in 6 months with Dr. Anne Fu.  You will receive a letter in the mail 2 months before you are due.  Please call us when you receive this letter to schedule your follow up appointment.   Any Other Special Instructions Will Be Listed Below (If Applicable). You have been referred for a Palliative Care consult.  If you need a refill on your cardiac medications before your next appointment, please call your pharmacy.  Thank you for choosing [facility]!!

## 2016-08-19 NOTE — Progress Notes (Signed)
[address] [place], [place]  [address] Phone: [phone] Fax:  [phone]   08/19/2016   ID:  [name], DOB [birth year], MRN [id number]  PCP:  Ginette Otto, MD   History of Present Illness: [name] is a 83 y.o. female with severe aortic stenosis here for followup.  She was seen by Dr. Clifton James and TAVR team, not felt to be a candidate for TAVR.  She has been experiencing advancement in memory impairment. She tolerated left knee replacement in May of 2015. Nuclear stress test performed at that time was low risk overall.   Although she does not have any evidence of syncope or angina, she has been feeling more shortness of breath with activity. For instance when trying to keep up with her husband, she feels somewhat winded. Most of these questions and answers are being performed by the husband. She denies any history of prior stroke. No prior cardiovascular history such as MI. She is also struggling at times with sleep. She gets up in the morning than once to take a nap immediately.  She saw Dr. Donette Larry who gave her Lasix.  She is originally from [place], her husband is from [place]. She is currently on Namzaric, memory impairment. This seems moderate and perhaps advancing.   She has fallen, mechanical falls. No syncope. When bends over falls forward.  Overall no significant changes.   08/19/16 She is becoming more short of breath according to her husband. No recent falls. Her memory is getting worse. This is very challenging for him. Currently in clinic she appears comfortable sitting in the chair. No increased work of breathing. No syncope, no orthopnea. Chronic lower extremity ankle edema. Her husband showed a tear during this visit.  Wt Readings from Last 3 Encounters:  08/19/16 233 lb 9.6 oz (106 kg)  05/26/16 234 lb 6.4 oz (106.3 kg)  03/19/16 244 lb 6.4 oz (110.9 kg)     Past Medical History:  Diagnosis Date  . Aortic stenosis     . Arthritis   . Complication of anesthesia    trouble waking up-denies  . Heart murmur   . Hepatitis    as a child" Liver infection", yellow jauncice  . History of kidney stones   . History of nephrolithiasis    as child  . Hypertension    on treatment  . Osteopenia   . Thrombocytopenia, unspecified (HCC)   . Venous insufficiency    only varicose veins    Past Surgical History:  Procedure Laterality Date  . ABDOMINAL HYSTERECTOMY     also oohorectomy ?side  . FRACTURE SURGERY     rt wrist , left hand  . JOINT REPLACEMENT  [id number]   rt and lft  . LUNG LOBECTOMY  2004   rt upper lobe  . REVISION TOTAL KNEE ARTHROPLASTY Left 10/04/2013   DR Cleophas Dunker  . SHOULDER HEMI-ARTHROPLASTY  06/03/2011   Procedure: SHOULDER HEMI-ARTHROPLASTY;  Surgeon: Valeria Batman, MD;  Location: [facility] OR;  Service: Orthopedics;  Laterality: Left;  Left Hemi Shoulder Hemi-Arthroplasty   . TOTAL KNEE ARTHROPLASTY     bilateral  . TOTAL KNEE REVISION Left 10/04/2013   Procedure: TOTAL KNEE REVISION ;  Surgeon: Valeria Batman, MD;  Location: [facility] OR;  Service: Orthopedics;  Laterality: Left;  LEFT TOTAL KNEE REVISION   . TOTAL SHOULDER ARTHROPLASTY  2005   right  . VASCULAR SURGERY  2005   varicose vein  Current Outpatient Prescriptions  Medication Sig Dispense Refill  . acetaminophen (TYLENOL) 325 MG tablet Take 2 tablets (650 mg total) by mouth every 6 (six) hours as needed for mild pain (or Fever >/= 101).    Marland Kitchen aspirin EC 81 MG tablet Take 81 mg by mouth daily.    . Ferrous Gluconate-C-Folic Acid (IRON-C PO) Take 65 mg by mouth daily.    . furosemide (LASIX) 20 MG tablet Take 20 mg by mouth daily.    Marland Kitchen KRILL OIL PO Take 1 Dose by mouth daily.     . Multiple Vitamins-Minerals (PRESERVISION AREDS 2 PO) Take 1 tablet by mouth daily.    Marland Kitchen NAMZARIC 28-10 MG CP24 Take 1 capsule by mouth daily.    . Naproxen Sod-Diphenhydramine (ALEVE PM PO) Take 1 tablet by mouth 2 (two) times daily as needed  (pain).      No current facility-administered medications for this visit.     Allergies:    Allergies  Allergen Reactions  . Lisinopril Cough  . Micardis [Telmisartan] Other (See Comments)    dizziness    Social History:  The patient  reports that she has never smoked. She has never used smokeless tobacco. She reports that she drinks about 0.5 oz of alcohol per week . She reports that she does not use drugs.   Family History  Problem Relation Age of Onset  . Diabetes Brother   . Anesthesia problems Neg Hx   . Hypotension Neg Hx   . Malignant hyperthermia Neg Hx   . Pseudochol deficiency Neg Hx     ROS:  Please see the history of present illness.   denies any bleeding, syncope, orthopnea, PND, rash. No chest pain   All other systems reviewed and negative.   PHYSICAL EXAM: VS:  BP 124/78   Pulse 77   Ht 5\' 2"  (1.575 m)   Wt 233 lb 9.6 oz (106 kg)   LMP  (LMP Unknown)   SpO2 93%   BMI 42.73 kg/m  GEN: Well nourished, well developed, in no acute distress  HEENT: normal  Neck: no JVD, carotid bruits, or masses Cardiac: RRR; 3/6 SEM late peaking blunted S2, no rubs, or gallops, 2+ ankle edema B Respiratory:  clear to auscultation bilaterally, normal work of breathing GI: soft, nontender, nondistended, + BS MS: no deformity or atrophy  Skin: warm and dry, no rash Neuro:  Alert and Oriented x 3, Strength and sensation are intact Psych: memory impair.    EKG:  EKG 01/21/16-sinus rhythm, LAFB personally viewed-no significant change from prior-01/21/16-sinus rhythm, LAFB 01/15/15-ectopic atrial rhythm, premature atrial contraction, left anterior fascicular block, poor R-wave progression, LVH. 08/25/13-sinus rhythm, 65, poor R wave progression, question possible limb lead reversal. Left posterior fascicular block possible.   ECHO: 07/16/15:  - Left ventricle: The cavity size was normal. Wall thickness was  increased in a pattern of mild LVH. Systolic function was normal.  The  estimated ejection fraction was in the range of 55% to 60%.  Wall motion was normal; there were no regional wall motion  abnormalities. Doppler parameters are consistent with abnormal  left ventricular relaxation (grade 1 diastolic dysfunction).  Doppler parameters are consistent with high ventricular filling  pressure. - Aortic valve: Valve mobility was restricted. There was severe  stenosis. There was trivial regurgitation. - Ascending aorta: The ascending aorta was mildly dilated. - Mitral valve: Calcified annulus. There was mild to moderate  regurgitation. - Left atrium: The atrium was moderately dilated. - Pulmonary  arteries: Systolic pressure was mildly increased. PA  peak pressure: 32 mm Hg (S).  Impressions:  - Normal LV systolic function; grade 1 diastolic dysfunction;  elevated LV filling pressure; mild LVH; moderate LAE; calcfied  aortic valve with severe AS (mean gradient 41 mmHg) and trace AI;  mild to moderate MR; trace TR with mildly elevated pulmonary  pressure.   ASSESSMENT AND PLAN:  Severe aortic stenosis-echocardiogram reviewed. 41 mmHg mean gradient. Severe. She is  having symptoms of shortness of breath. I agree with use of furosemide to help palliate. I have explained to them the physiology, pathology behind aortic stenosis and natural course.  Unfortunately, not TAVR or surgical candidate. Memory impairment seems to be advancing. As heart failure symptoms continue to progress, I think palliative measures would be helpful, perhaps home palliative care team or potentially hospice at one point. Will refer to palliative care [facility] for home assessment.  Osteoarthritis-Dr. Cleophas Dunker is orthopedist. Left knee replacement 5/15. Tolerating well. Continue to encourage movement.  Hypertension, essential-continue to treat. Medications reviewed, no changes.  Mechanical fall. No recent falls-Encouraged use of normal shoes. In the she has worn  flip-flop-like shoes and has fallen Careful ambulation  Memory impairment-she is on new medicine called Namzaric. Dr. Pete Glatter has been monitoring. Her husband reports that this has been advancing. Continuing to work with Dr. Pete Glatter.  Fatigue -  Unfortunately, fatigue, shortness of breath will worsen with her aortic stenosis.  Unfortunately there is no mechanical fix. I continue to encourage activity, movement.   We'll see back in 6 months or sooner if necessary.  Signed, Donato Schultz, MD [facility]  08/19/2016 9:27 AM

## 2016-08-27 DIAGNOSIS — R531 Weakness: Secondary | ICD-10-CM | POA: Diagnosis not present

## 2016-08-28 DIAGNOSIS — H353212 Exudative age-related macular degeneration, right eye, with inactive choroidal neovascularization: Secondary | ICD-10-CM | POA: Diagnosis not present

## 2016-08-28 DIAGNOSIS — H353122 Nonexudative age-related macular degeneration, left eye, intermediate dry stage: Secondary | ICD-10-CM | POA: Diagnosis not present

## 2016-09-03 DIAGNOSIS — I872 Venous insufficiency (chronic) (peripheral): Secondary | ICD-10-CM | POA: Diagnosis not present

## 2016-09-03 DIAGNOSIS — F0280 Dementia in other diseases classified elsewhere without behavioral disturbance: Secondary | ICD-10-CM | POA: Diagnosis not present

## 2016-09-03 DIAGNOSIS — R2689 Other abnormalities of gait and mobility: Secondary | ICD-10-CM | POA: Diagnosis not present

## 2016-09-03 DIAGNOSIS — G301 Alzheimer's disease with late onset: Secondary | ICD-10-CM | POA: Diagnosis not present

## 2016-09-03 DIAGNOSIS — E669 Obesity, unspecified: Secondary | ICD-10-CM | POA: Diagnosis not present

## 2016-09-03 DIAGNOSIS — I10 Essential (primary) hypertension: Secondary | ICD-10-CM | POA: Diagnosis not present

## 2016-09-03 DIAGNOSIS — D696 Thrombocytopenia, unspecified: Secondary | ICD-10-CM | POA: Diagnosis not present

## 2016-09-04 DIAGNOSIS — I872 Venous insufficiency (chronic) (peripheral): Secondary | ICD-10-CM | POA: Diagnosis not present

## 2016-09-04 DIAGNOSIS — F0280 Dementia in other diseases classified elsewhere without behavioral disturbance: Secondary | ICD-10-CM | POA: Diagnosis not present

## 2016-09-04 DIAGNOSIS — D696 Thrombocytopenia, unspecified: Secondary | ICD-10-CM | POA: Diagnosis not present

## 2016-09-04 DIAGNOSIS — I10 Essential (primary) hypertension: Secondary | ICD-10-CM | POA: Diagnosis not present

## 2016-09-04 DIAGNOSIS — R2689 Other abnormalities of gait and mobility: Secondary | ICD-10-CM | POA: Diagnosis not present

## 2016-09-04 DIAGNOSIS — G301 Alzheimer's disease with late onset: Secondary | ICD-10-CM | POA: Diagnosis not present

## 2016-09-05 DIAGNOSIS — I872 Venous insufficiency (chronic) (peripheral): Secondary | ICD-10-CM | POA: Diagnosis not present

## 2016-09-05 DIAGNOSIS — F0280 Dementia in other diseases classified elsewhere without behavioral disturbance: Secondary | ICD-10-CM | POA: Diagnosis not present

## 2016-09-05 DIAGNOSIS — G301 Alzheimer's disease with late onset: Secondary | ICD-10-CM | POA: Diagnosis not present

## 2016-09-05 DIAGNOSIS — D696 Thrombocytopenia, unspecified: Secondary | ICD-10-CM | POA: Diagnosis not present

## 2016-09-05 DIAGNOSIS — I10 Essential (primary) hypertension: Secondary | ICD-10-CM | POA: Diagnosis not present

## 2016-09-05 DIAGNOSIS — R2689 Other abnormalities of gait and mobility: Secondary | ICD-10-CM | POA: Diagnosis not present

## 2016-09-08 DIAGNOSIS — I10 Essential (primary) hypertension: Secondary | ICD-10-CM | POA: Diagnosis not present

## 2016-09-08 DIAGNOSIS — G301 Alzheimer's disease with late onset: Secondary | ICD-10-CM | POA: Diagnosis not present

## 2016-09-08 DIAGNOSIS — F0280 Dementia in other diseases classified elsewhere without behavioral disturbance: Secondary | ICD-10-CM | POA: Diagnosis not present

## 2016-09-08 DIAGNOSIS — R2689 Other abnormalities of gait and mobility: Secondary | ICD-10-CM | POA: Diagnosis not present

## 2016-09-08 DIAGNOSIS — I872 Venous insufficiency (chronic) (peripheral): Secondary | ICD-10-CM | POA: Diagnosis not present

## 2016-09-08 DIAGNOSIS — D696 Thrombocytopenia, unspecified: Secondary | ICD-10-CM | POA: Diagnosis not present

## 2016-09-10 DIAGNOSIS — I872 Venous insufficiency (chronic) (peripheral): Secondary | ICD-10-CM | POA: Diagnosis not present

## 2016-09-10 DIAGNOSIS — I10 Essential (primary) hypertension: Secondary | ICD-10-CM | POA: Diagnosis not present

## 2016-09-10 DIAGNOSIS — R2689 Other abnormalities of gait and mobility: Secondary | ICD-10-CM | POA: Diagnosis not present

## 2016-09-10 DIAGNOSIS — F0280 Dementia in other diseases classified elsewhere without behavioral disturbance: Secondary | ICD-10-CM | POA: Diagnosis not present

## 2016-09-10 DIAGNOSIS — G301 Alzheimer's disease with late onset: Secondary | ICD-10-CM | POA: Diagnosis not present

## 2016-09-10 DIAGNOSIS — D696 Thrombocytopenia, unspecified: Secondary | ICD-10-CM | POA: Diagnosis not present

## 2016-09-12 DIAGNOSIS — I10 Essential (primary) hypertension: Secondary | ICD-10-CM | POA: Diagnosis not present

## 2016-09-12 DIAGNOSIS — D696 Thrombocytopenia, unspecified: Secondary | ICD-10-CM | POA: Diagnosis not present

## 2016-09-12 DIAGNOSIS — F0280 Dementia in other diseases classified elsewhere without behavioral disturbance: Secondary | ICD-10-CM | POA: Diagnosis not present

## 2016-09-12 DIAGNOSIS — R2689 Other abnormalities of gait and mobility: Secondary | ICD-10-CM | POA: Diagnosis not present

## 2016-09-12 DIAGNOSIS — G301 Alzheimer's disease with late onset: Secondary | ICD-10-CM | POA: Diagnosis not present

## 2016-09-12 DIAGNOSIS — I872 Venous insufficiency (chronic) (peripheral): Secondary | ICD-10-CM | POA: Diagnosis not present

## 2016-09-15 DIAGNOSIS — R2689 Other abnormalities of gait and mobility: Secondary | ICD-10-CM | POA: Diagnosis not present

## 2016-09-15 DIAGNOSIS — D696 Thrombocytopenia, unspecified: Secondary | ICD-10-CM | POA: Diagnosis not present

## 2016-09-15 DIAGNOSIS — G301 Alzheimer's disease with late onset: Secondary | ICD-10-CM | POA: Diagnosis not present

## 2016-09-15 DIAGNOSIS — I872 Venous insufficiency (chronic) (peripheral): Secondary | ICD-10-CM | POA: Diagnosis not present

## 2016-09-15 DIAGNOSIS — F0280 Dementia in other diseases classified elsewhere without behavioral disturbance: Secondary | ICD-10-CM | POA: Diagnosis not present

## 2016-09-15 DIAGNOSIS — I10 Essential (primary) hypertension: Secondary | ICD-10-CM | POA: Diagnosis not present

## 2016-09-18 DIAGNOSIS — I10 Essential (primary) hypertension: Secondary | ICD-10-CM | POA: Diagnosis not present

## 2016-09-18 DIAGNOSIS — G301 Alzheimer's disease with late onset: Secondary | ICD-10-CM | POA: Diagnosis not present

## 2016-09-18 DIAGNOSIS — I872 Venous insufficiency (chronic) (peripheral): Secondary | ICD-10-CM | POA: Diagnosis not present

## 2016-09-18 DIAGNOSIS — F0280 Dementia in other diseases classified elsewhere without behavioral disturbance: Secondary | ICD-10-CM | POA: Diagnosis not present

## 2016-09-18 DIAGNOSIS — R2689 Other abnormalities of gait and mobility: Secondary | ICD-10-CM | POA: Diagnosis not present

## 2016-09-18 DIAGNOSIS — D696 Thrombocytopenia, unspecified: Secondary | ICD-10-CM | POA: Diagnosis not present

## 2016-09-19 DIAGNOSIS — I10 Essential (primary) hypertension: Secondary | ICD-10-CM | POA: Diagnosis not present

## 2016-09-19 DIAGNOSIS — F0280 Dementia in other diseases classified elsewhere without behavioral disturbance: Secondary | ICD-10-CM | POA: Diagnosis not present

## 2016-09-19 DIAGNOSIS — D696 Thrombocytopenia, unspecified: Secondary | ICD-10-CM | POA: Diagnosis not present

## 2016-09-19 DIAGNOSIS — R2689 Other abnormalities of gait and mobility: Secondary | ICD-10-CM | POA: Diagnosis not present

## 2016-09-19 DIAGNOSIS — I872 Venous insufficiency (chronic) (peripheral): Secondary | ICD-10-CM | POA: Diagnosis not present

## 2016-09-19 DIAGNOSIS — G301 Alzheimer's disease with late onset: Secondary | ICD-10-CM | POA: Diagnosis not present

## 2016-09-23 DIAGNOSIS — I10 Essential (primary) hypertension: Secondary | ICD-10-CM | POA: Diagnosis not present

## 2016-09-23 DIAGNOSIS — G301 Alzheimer's disease with late onset: Secondary | ICD-10-CM | POA: Diagnosis not present

## 2016-09-23 DIAGNOSIS — R2689 Other abnormalities of gait and mobility: Secondary | ICD-10-CM | POA: Diagnosis not present

## 2016-09-23 DIAGNOSIS — D696 Thrombocytopenia, unspecified: Secondary | ICD-10-CM | POA: Diagnosis not present

## 2016-09-23 DIAGNOSIS — I872 Venous insufficiency (chronic) (peripheral): Secondary | ICD-10-CM | POA: Diagnosis not present

## 2016-09-23 DIAGNOSIS — F0280 Dementia in other diseases classified elsewhere without behavioral disturbance: Secondary | ICD-10-CM | POA: Diagnosis not present

## 2016-09-26 DIAGNOSIS — G301 Alzheimer's disease with late onset: Secondary | ICD-10-CM | POA: Diagnosis not present

## 2016-09-26 DIAGNOSIS — D696 Thrombocytopenia, unspecified: Secondary | ICD-10-CM | POA: Diagnosis not present

## 2016-09-26 DIAGNOSIS — R2689 Other abnormalities of gait and mobility: Secondary | ICD-10-CM | POA: Diagnosis not present

## 2016-09-26 DIAGNOSIS — I872 Venous insufficiency (chronic) (peripheral): Secondary | ICD-10-CM | POA: Diagnosis not present

## 2016-09-26 DIAGNOSIS — I10 Essential (primary) hypertension: Secondary | ICD-10-CM | POA: Diagnosis not present

## 2016-09-26 DIAGNOSIS — F0280 Dementia in other diseases classified elsewhere without behavioral disturbance: Secondary | ICD-10-CM | POA: Diagnosis not present

## 2016-10-02 DIAGNOSIS — R5383 Other fatigue: Secondary | ICD-10-CM | POA: Diagnosis not present

## 2016-10-02 DIAGNOSIS — G301 Alzheimer's disease with late onset: Secondary | ICD-10-CM | POA: Diagnosis not present

## 2016-10-02 DIAGNOSIS — I10 Essential (primary) hypertension: Secondary | ICD-10-CM | POA: Diagnosis not present

## 2016-10-02 DIAGNOSIS — Q253 Supravalvular aortic stenosis: Secondary | ICD-10-CM | POA: Diagnosis not present

## 2016-10-02 DIAGNOSIS — R05 Cough: Secondary | ICD-10-CM | POA: Diagnosis not present

## 2016-10-06 DIAGNOSIS — Q253 Supravalvular aortic stenosis: Secondary | ICD-10-CM | POA: Diagnosis not present

## 2016-10-06 DIAGNOSIS — R5383 Other fatigue: Secondary | ICD-10-CM | POA: Diagnosis not present

## 2016-10-06 DIAGNOSIS — I10 Essential (primary) hypertension: Secondary | ICD-10-CM | POA: Diagnosis not present

## 2016-11-11 DIAGNOSIS — Z6839 Body mass index (BMI) 39.0-39.9, adult: Secondary | ICD-10-CM | POA: Diagnosis not present

## 2016-11-11 DIAGNOSIS — E669 Obesity, unspecified: Secondary | ICD-10-CM | POA: Diagnosis not present

## 2016-11-11 DIAGNOSIS — I10 Essential (primary) hypertension: Secondary | ICD-10-CM | POA: Diagnosis not present

## 2016-11-11 DIAGNOSIS — R600 Localized edema: Secondary | ICD-10-CM | POA: Diagnosis not present

## 2016-11-11 DIAGNOSIS — Z79899 Other long term (current) drug therapy: Secondary | ICD-10-CM | POA: Diagnosis not present

## 2016-11-11 DIAGNOSIS — Q253 Supravalvular aortic stenosis: Secondary | ICD-10-CM | POA: Diagnosis not present

## 2016-11-11 DIAGNOSIS — G301 Alzheimer's disease with late onset: Secondary | ICD-10-CM | POA: Diagnosis not present

## 2016-12-03 DIAGNOSIS — I10 Essential (primary) hypertension: Secondary | ICD-10-CM | POA: Diagnosis not present

## 2016-12-03 DIAGNOSIS — I350 Nonrheumatic aortic (valve) stenosis: Secondary | ICD-10-CM | POA: Diagnosis not present

## 2016-12-04 NOTE — Progress Notes (Signed)
Cardiology Office Note   Date:  12/05/2016   ID:  Mishal Probert, DOB [birth year], MRN [id number]  PCP:  Merlene Laughter, MD  Cardiologist:  Dr. Anne Fu    Chief Complaint  Patient presents with  . Aortic Stenosis    severe.       History of Present Illness: [name] is a 83 y.o. female who presents for AS.  She has severe aortic stenosis here for followup.  She was seen by Dr. Clifton James and TAVR team, not felt to be a candidate for TAVR.  She has been experiencing advancement in memory impairment. She tolerated left knee replacement in May of 2015. Nuclear stress test performed at that time was low risk overall.   Although she does not have any evidence of syncope or angina, she has been feeling more shortness of breath with activity.   Today just getting off the table she becomes winded.  She has significant LE edema to knees bil.  Dr. Adella Nissen has just adjusted her lasix to 80 alternating with 40 daily.   She will take the 80 tomorrow.  Her last Cr was stable at 1.080.  Her K+ was 5.9.  She at times forgets and adds salt.   Her husband says she has increased SOB, but does not awaken her at night.  No chest pain.      Past Medical History:  Diagnosis Date  . Aortic stenosis   . Arthritis   . Complication of anesthesia    trouble waking up-denies  . Heart murmur   . Hepatitis    as a child" Liver infection", yellow jauncice  . History of kidney stones   . History of nephrolithiasis    as child  . Hypertension    on treatment  . Osteopenia   . Thrombocytopenia, unspecified (HCC)   . Venous insufficiency    only varicose veins    Past Surgical History:  Procedure Laterality Date  . ABDOMINAL HYSTERECTOMY     also oohorectomy ?side  . FRACTURE SURGERY     rt wrist , left hand  . JOINT REPLACEMENT  7829,5621   rt and lft  . LUNG LOBECTOMY  2004   rt upper lobe  . REVISION TOTAL KNEE ARTHROPLASTY Left 10/04/2013   DR Cleophas Dunker  . SHOULDER  HEMI-ARTHROPLASTY  06/03/2011   Procedure: SHOULDER HEMI-ARTHROPLASTY;  Surgeon: Valeria Batman, MD;  Location: [facility] OR;  Service: Orthopedics;  Laterality: Left;  Left Hemi Shoulder Hemi-Arthroplasty   . TOTAL KNEE ARTHROPLASTY     bilateral  . TOTAL KNEE REVISION Left 10/04/2013   Procedure: TOTAL KNEE REVISION ;  Surgeon: Valeria Batman, MD;  Location: [facility] OR;  Service: Orthopedics;  Laterality: Left;  LEFT TOTAL KNEE REVISION   . TOTAL SHOULDER ARTHROPLASTY  2005   right  . VASCULAR SURGERY  2005   varicose vein      Current Outpatient Prescriptions  Medication Sig Dispense Refill  . acetaminophen (TYLENOL) 325 MG tablet Take 2 tablets (650 mg total) by mouth every 6 (six) hours as needed for mild pain (or Fever >/= 101).    Marland Kitchen aspirin EC 81 MG tablet Take 81 mg by mouth daily.    . Ferrous Gluconate-C-Folic Acid (IRON-C PO) Take 65 mg by mouth daily.    . furosemide (LASIX) 20 MG tablet Take 20 mg by mouth daily.    Marland Kitchen KRILL OIL PO Take 1 Dose by mouth daily.     . Multiple Vitamins-Minerals (PRESERVISION AREDS  2 PO) Take 1 tablet by mouth daily.    Marland Kitchen NAMZARIC 28-10 MG CP24 Take 1 capsule by mouth daily.    . Naproxen Sod-Diphenhydramine (ALEVE PM PO) Take 1 tablet by mouth 2 (two) times daily as needed (pain).      No current facility-administered medications for this visit.     Allergies:   Lisinopril and Micardis [telmisartan]    Social History:  The patient  reports that she has never smoked. She has never used smokeless tobacco. She reports that she drinks about 0.5 oz of alcohol per week . She reports that she does not use drugs.   Family History:  The patient's family history includes Diabetes in her brother.    ROS:  General:no colds or fevers, + weight loss despite edema Skin:no rashes or ulcers HEENT:no blurred vision, no congestion CV:see HPI PUL:see HPI GI:no diarrhea constipation or melena, no indigestion GU:no hematuria, no dysuria MS:+ joint pain, no  claudication Neuro:no syncope, no lightheadedness Endo:no diabetes, no thyroid disease  Wt Readings from Last 3 Encounters:  12/05/16 232 lb (105.2 kg)  08/19/16 233 lb 9.6 oz (106 kg)  05/26/16 234 lb 6.4 oz (106.3 kg)     PHYSICAL EXAM: VS:  BP 132/70   Pulse 82   Ht 5\' 6"  (1.676 m)   Wt 232 lb (105.2 kg)   LMP  (LMP Unknown)   SpO2 96%   BMI 37.45 kg/m  , BMI Body mass index is 37.45 kg/m. General:Pleasant affect, NAD Skin:Warm and dry, brisk capillary refill HEENT:normocephalic, sclera clear, mucus membranes moist Neck:supple, no JVD, no bruits  Heart:S1S2 RRR with 3/6 harsh aortic murmur, no gallup, rub or click Lungs: with few fine rales in bases, no rhonchi, or wheezes ZOX:WRUE, non tender, + BS, do not palpate liver spleen or masses Ext:+3-4 + lower ext edema to knees, unable to palpate pedal pulses with edema, 2+ radial pulses Neuro:alert and oriented, MAE, follows commands, + facial symmetry    EKG:  EKG is ordered today. The ekg ordered today demonstrates SR rate of 82 incomplete RBBB, LAFB LVH no change from 12/2015.    Recent Labs: No results found for requested labs within last 8760 hours.    Lipid Panel No results found for: CHOL, TRIG, HDL, CHOLHDL, VLDL, LDLCALC, LDLDIRECT     Other studies Reviewed: Additional studies/ records that were reviewed today include: . Echo 06/2015 Study Conclusions  - Left ventricle: The cavity size was normal. Wall thickness was   increased in a pattern of mild LVH. Systolic function was normal.   The estimated ejection fraction was in the range of 55% to 60%.   Wall motion was normal; there were no regional wall motion   abnormalities. Doppler parameters are consistent with abnormal   left ventricular relaxation (grade 1 diastolic dysfunction).   Doppler parameters are consistent with high ventricular filling   pressure. - Aortic valve: Valve mobility was restricted. There was severe   stenosis. There was  trivial regurgitation. - Ascending aorta: The ascending aorta was mildly dilated. - Mitral valve: Calcified annulus. There was mild to moderate   regurgitation. - Left atrium: The atrium was moderately dilated. - Pulmonary arteries: Systolic pressure was mildly increased. PA   peak pressure: 32 mm Hg (S).  Impressions:  - Normal LV systolic function; grade 1 diastolic dysfunction;   elevated LV filling pressure; mild LVH; moderate LAE; calcfied   aortic valve with severe AS (mean gradient 41 mmHg) and trace AI;  mild to moderate MR; trace TR with mildly elevated pulmonary   pressure.   Nuc STudy 08/2013 Impression Exercise Capacity:  Lexiscan with no exercise. BP Response:  Normal blood pressure response. Clinical Symptoms:  Mild nausea and shortness of breath ECG Impression:  No significant ST segment change suggestive of ischemia. Comparison with Prior Nuclear Study: No previous nuclear study performed  Overall Impression:  Normal stress nuclear study.   ASSESSMENT AND PLAN:  1.  Severe AS now with increased edema and SOB.  Lungs with minimal rales.  BP is stable.  Plan for lasix 40 mg alt with 80n mg every other day, but if no improvement plan would be to add metolazone prior to lasix vs. Admit for IV diuretics.  Would prefer as outpt. Pt's husband will call Wed. And talk with nurse if improving.  Dr. Anne Fu or I will see back in a month.  Reminder to decrease salt.  Not TAVR or surgical candidate.    2.  HTN STABLE  3.  Memory impairment she tells me her memory is good it is her husband's that is poor.    4.  Osteoarthritis with hx shoulder replacement and both knees replaced one of them X 2.  stable.     Current medicines are reviewed with the patient today.  The patient Has no concerns regarding medicines.  The following changes have been made:  See above Labs/ tests ordered today include:see above  Disposition:   FU:  see above  Signed, Nada Boozer, NP    12/05/2016 1:50 PM    [facility] [address] [place], [place], [place]  [address]/ [address] [employer] [employer] [address] [place], [place] Phone: [phone]; Fax: [phone]  [phone]

## 2016-12-05 ENCOUNTER — Ambulatory Visit (INDEPENDENT_AMBULATORY_CARE_PROVIDER_SITE_OTHER): Payer: Medicare Other | Admitting: Cardiology

## 2016-12-05 ENCOUNTER — Encounter (INDEPENDENT_AMBULATORY_CARE_PROVIDER_SITE_OTHER): Payer: Self-pay

## 2016-12-05 ENCOUNTER — Encounter: Payer: Self-pay | Admitting: Cardiology

## 2016-12-05 VITALS — BP 132/70 | HR 82 | Ht 66.0 in | Wt 232.0 lb

## 2016-12-05 DIAGNOSIS — R413 Other amnesia: Secondary | ICD-10-CM | POA: Diagnosis not present

## 2016-12-05 DIAGNOSIS — R600 Localized edema: Secondary | ICD-10-CM | POA: Diagnosis not present

## 2016-12-05 DIAGNOSIS — I350 Nonrheumatic aortic (valve) stenosis: Secondary | ICD-10-CM | POA: Diagnosis not present

## 2016-12-05 DIAGNOSIS — I10 Essential (primary) hypertension: Secondary | ICD-10-CM | POA: Diagnosis not present

## 2016-12-05 NOTE — Patient Instructions (Signed)
Medication Instructions:  Your physician recommends that you continue on your current medications as directed. Please refer to the Current Medication list given to you today.   Labwork: None Ordered.  Testing/Procedures: None Ordered   Follow-Up: Your physician recommends that you schedule a follow-up appointment in: 1 month with Dr. Anne Fu or Nada Boozer, NP.  Any Other Special Instructions Will Be Listed Below (If Applicable).  Please call us on Wednesday 12/10/16 to let us know how you are feeling so that we can adjust your medications. Please ask to speak to Dr. Anne Fu nurse or someone in the triage room.    If you need a refill on your cardiac medications before your next appointment, please call your pharmacy.

## 2016-12-10 ENCOUNTER — Telehealth: Payer: Self-pay | Admitting: Cardiology

## 2016-12-10 NOTE — Telephone Encounter (Signed)
Note from Nada Boozer NP office visit on 12/05/16 - "Severe AS now with increased edema and SOB.  Lungs with minimal rales.  BP is stable.  Plan for lasix 40 mg alt with 80n mg every other day, but if no improvement plan would be to add metolazone prior to lasix vs. Admit for IV diuretics.  Would prefer as outpt. Pt's husband will call Wed. And talk with nurse if improving.  Dr. Anne Fu or I will see back in a month.  Reminder to decrease salt.  Not TAVR or surgical candidate."   Called patient's husband about message. Patient has been feeling better, but only slightly better. Patient is still tired, but not as tired as she was. Consulted Dr. Clifton James (DOD) he stated to continue current plan and have Dr. Anne Fu address next week. Will forward to Dr. Anne Fu and his nurse.

## 2016-12-10 NOTE — Telephone Encounter (Signed)
New message      Pt c/o medication issue:  1. Name of Medication:  furosemide  2. How are you currently taking this medication (dosage and times per day)?    3. Are you having a reaction (difficulty breathing--STAT)?  no 4. What is your medication issue?  Pt was seen recently and was to call and give an update.  Husband states that pt has improved slightly.  She is still very tired and the swelling is down slightly.  Please call to let her know if she is to continue taking the furosemide as directed

## 2016-12-10 NOTE — Addendum Note (Signed)
Addended by: Etheleen Mayhew C on: 12/10/2016 09:53 AM   Modules accepted: Orders

## 2016-12-12 NOTE — Telephone Encounter (Signed)
Spoke with the both the [name] and wife that per Dr. Anne Fu, the [name] should continue her current plan, for fatigue is expected with her severe AS. Both parties verbalized understanding and agrees with this plan.

## 2016-12-12 NOTE — Telephone Encounter (Signed)
Continue current plan. Thanks for update. Fatigue is expected with her severe AS.  Donato Schultz, MD

## 2016-12-17 DIAGNOSIS — I10 Essential (primary) hypertension: Secondary | ICD-10-CM | POA: Diagnosis not present

## 2016-12-17 DIAGNOSIS — G301 Alzheimer's disease with late onset: Secondary | ICD-10-CM | POA: Diagnosis not present

## 2016-12-17 DIAGNOSIS — Z7189 Other specified counseling: Secondary | ICD-10-CM | POA: Diagnosis not present

## 2016-12-17 DIAGNOSIS — I5022 Chronic systolic (congestive) heart failure: Secondary | ICD-10-CM | POA: Diagnosis not present

## 2016-12-17 DIAGNOSIS — I350 Nonrheumatic aortic (valve) stenosis: Secondary | ICD-10-CM | POA: Diagnosis not present

## 2017-01-13 DIAGNOSIS — Z1389 Encounter for screening for other disorder: Secondary | ICD-10-CM | POA: Diagnosis not present

## 2017-01-13 DIAGNOSIS — I10 Essential (primary) hypertension: Secondary | ICD-10-CM | POA: Diagnosis not present

## 2017-01-13 DIAGNOSIS — Z23 Encounter for immunization: Secondary | ICD-10-CM | POA: Diagnosis not present

## 2017-01-13 DIAGNOSIS — G301 Alzheimer's disease with late onset: Secondary | ICD-10-CM | POA: Diagnosis not present

## 2017-01-13 DIAGNOSIS — I350 Nonrheumatic aortic (valve) stenosis: Secondary | ICD-10-CM | POA: Diagnosis not present

## 2017-01-13 DIAGNOSIS — I5022 Chronic systolic (congestive) heart failure: Secondary | ICD-10-CM | POA: Diagnosis not present

## 2017-01-15 ENCOUNTER — Encounter: Payer: Self-pay | Admitting: Cardiology

## 2017-01-15 ENCOUNTER — Encounter (INDEPENDENT_AMBULATORY_CARE_PROVIDER_SITE_OTHER): Payer: Self-pay

## 2017-01-15 ENCOUNTER — Ambulatory Visit (INDEPENDENT_AMBULATORY_CARE_PROVIDER_SITE_OTHER): Payer: Medicare Other | Admitting: Cardiology

## 2017-01-15 VITALS — BP 100/60 | HR 98 | Ht 66.0 in | Wt 223.1 lb

## 2017-01-15 DIAGNOSIS — R413 Other amnesia: Secondary | ICD-10-CM

## 2017-01-15 DIAGNOSIS — I10 Essential (primary) hypertension: Secondary | ICD-10-CM | POA: Diagnosis not present

## 2017-01-15 DIAGNOSIS — I350 Nonrheumatic aortic (valve) stenosis: Secondary | ICD-10-CM

## 2017-01-15 MED ORDER — METOLAZONE 2.5 MG PO TABS: ORAL_TABLET | ORAL | 4 refills | Status: DC

## 2017-01-15 NOTE — Progress Notes (Signed)
Cardiology Office Note   Date:  01/15/2017   ID:  [name], DOB Apr 28, 1931, MRN [id number]  PCP:  Merlene Laughter, MD  Cardiologist:  Dr. Anne Fu    Chief Complaint  Patient presents with  . Edema      History of Present Illness: [name] is a 83 y.o. female who presents for AS with edema.   She has severe aortic stenosis here for followup. She was seen by Dr. Clifton James and TAVR team, not felt to be a candidate for TAVR. She has been experiencing advancement in memory impairment. She tolerated left knee replacement in May of 2015. Nuclear stress test performed at that time was low risk overall.   Although she does not have any evidence of syncope or angina, she has been feeling more shortness of breath with activity.   Last visit just getting off the table she becomes winded.  She has significant LE edema to knees bil.  Dr. Adella Nissen has just adjusted her lasix to 80 alternating with 40 daily.   She will take the 80 tomorrow.  Her last Cr was stable at 1.080.  Her K+ was 5.9.  She at times forgets and adds salt.   Her husband on last visit noted she had increased SOB, but does not awaken her at night.   I increased lasix to 40 mg alt with 80 mg every other day.     Back today for follow up. Dr. Pete Glatter had added metolazone as well and her husband stated she was receiving every other day and now wt down 9 lbs.  Her breathing is better.she still has lower ext edema. She is not responding much today to questions.  She did discuss how much she liked being outside.  No chest pain.  She still uses salt at times.  Her BP is lower today.         Past Medical History:  Diagnosis Date  . Aortic stenosis   . Arthritis   . Complication of anesthesia    trouble waking up-denies  . Heart murmur   . Hepatitis    as a child" Liver infection", yellow jauncice  . History of kidney stones   . History of nephrolithiasis    as child  . Hypertension    on treatment  .  Osteopenia   . Thrombocytopenia, unspecified (HCC)   . Venous insufficiency    only varicose veins    Past Surgical History:  Procedure Laterality Date  . ABDOMINAL HYSTERECTOMY     also oohorectomy ?side  . FRACTURE SURGERY     rt wrist , left hand  . JOINT REPLACEMENT  4098,1191   rt and lft  . LUNG LOBECTOMY  2004   rt upper lobe  . REVISION TOTAL KNEE ARTHROPLASTY Left 10/04/2013   DR Cleophas Dunker  . SHOULDER HEMI-ARTHROPLASTY  06/03/2011   Procedure: SHOULDER HEMI-ARTHROPLASTY;  Surgeon: Valeria Batman, MD;  Location: [facility] OR;  Service: Orthopedics;  Laterality: Left;  Left Hemi Shoulder Hemi-Arthroplasty   . TOTAL KNEE ARTHROPLASTY     bilateral  . TOTAL KNEE REVISION Left 10/04/2013   Procedure: TOTAL KNEE REVISION ;  Surgeon: Valeria Batman, MD;  Location: [facility] OR;  Service: Orthopedics;  Laterality: Left;  LEFT TOTAL KNEE REVISION   . TOTAL SHOULDER ARTHROPLASTY  2005   right  . VASCULAR SURGERY  2005   varicose vein      Current Outpatient Prescriptions  Medication Sig Dispense Refill  . aspirin EC 81  MG tablet Take 81 mg by mouth daily.    . Ferrous Gluconate-C-Folic Acid (IRON-C PO) Take 65 mg by mouth daily.    . furosemide (LASIX) 40 MG tablet TK 1 T PO ALTERNATE 2 TS ONCE A DAY  5  . KRILL OIL PO Take 1 Dose by mouth daily.     . metolazone (ZAROXOLYN) 2.5 MG tablet Take 2.5 mg by mouth daily.     . Multiple Vitamins-Minerals (PRESERVISION AREDS 2 PO) Take 1 tablet by mouth daily.    Marland Kitchen NAMZARIC 28-10 MG CP24 Take 1 capsule by mouth daily.    . Naproxen Sod-Diphenhydramine (ALEVE PM PO) Take 1 tablet by mouth 2 (two) times daily as needed (pain).      No current facility-administered medications for this visit.     Allergies:   Lisinopril and Micardis [telmisartan]    Social History:  The patient  reports that she has never smoked. She has never used smokeless tobacco. She reports that she drinks about 0.5 oz of alcohol per week . She reports that she does not  use drugs.   Family History:  The patient's family history includes Diabetes in her brother.    ROS:  General:no colds or fevers, + weight decrease.  Skin:no rashes or ulcers HEENT:no blurred vision, no congestion CV:see HPI PUL:see HPI Neuro:no syncope, no lightheadedness Endo:no diabetes, no thyroid disease  Wt Readings from Last 3 Encounters:  01/15/17 223 lb 1.9 oz (101.2 kg)  12/05/16 232 lb (105.2 kg)  08/19/16 233 lb 9.6 oz (106 kg)     PHYSICAL EXAM: VS:  BP 100/60   Pulse 98   Ht 5\' 6"  (1.676 m)   Wt 223 lb 1.9 oz (101.2 kg)   LMP  (LMP Unknown)   SpO2 90%   BMI 36.01 kg/m  , BMI Body mass index is 36.01 kg/m. General:Pleasant affect, NAD HEENT:normocephalic, sclera clear, mucus membranes moist Neck:supple, no JVD, + bruits  Heart:S1S2 RRR with 3/6 harsh murmur, no gallup, rub or click Lungs:with few rales in bases, no rhonchi, or wheezes OFH:QRFX, non tender, + BS, do not palpate liver spleen or masses Ext:2-3+ lower ext edema legs very tight,  2+ radial pulses Neuro:alert and oriented, MAE, follows commands, + facial symmetry    EKG:  EKG is NOT ordered today.   Recent Labs: No results found for requested labs within last 8760 hours.    Lipid Panel No results found for: CHOL, TRIG, HDL, CHOLHDL, VLDL, LDLCALC, LDLDIRECT     Other studies Reviewed: Additional studies/ records that were reviewed today include: . Echo 06/2015 Study Conclusions  - Left ventricle: The cavity size was normal. Wall thickness was increased in a pattern of mild LVH. Systolic function was normal. The estimated ejection fraction was in the range of 55% to 60%. Wall motion was normal; there were no regional wall motion abnormalities. Doppler parameters are consistent with abnormal left ventricular relaxation (grade 1 diastolic dysfunction). Doppler parameters are consistent with high ventricular filling pressure. - Aortic valve: Valve mobility was  restricted. There was severe stenosis. There was trivial regurgitation. - Ascending aorta: The ascending aorta was mildly dilated. - Mitral valve: Calcified annulus. There was mild to moderate regurgitation. - Left atrium: The atrium was moderately dilated. - Pulmonary arteries: Systolic pressure was mildly increased. PA peak pressure: 32 mm Hg (S).  Impressions:  - Normal LV systolic function; grade 1 diastolic dysfunction; elevated LV filling pressure; mild LVH; moderate LAE; calcfied aortic valve with severe  AS (mean gradient 41 mmHg) and trace AI; mild to moderate MR; trace TR with mildly elevated pulmonary pressure.   Nuc STudy 08/2013 Impression Exercise Capacity: Lexiscan with no exercise. BP Response: Normal blood pressure response. Clinical Symptoms: Mild nausea and shortness of breath ECG Impression: No significant ST segment change suggestive of ischemia. Comparison with Prior Nuclear Study: No previous nuclear study performed  Overall Impression: Normal stress nuclear study.    ASSESSMENT AND PLAN:  1.  Acute HF with lower ext edema due to severe AS.  Not TAVR or surgical candidate.  BP is lower at 100 systolic today, will decrease metolazone to Monday and Friday only.  receheck BMP  In 2-3 weeks.  Her labs from Dr. Pete Glatter Cr is elevated to 1.31 K+ 5.5  She will follow up with Dr. Anne Fu first of Nov.  2.  HTN lower today, 100 systolic with decreasing of diuretic this may help   3. Alzheimer's disease followed by PCP       Current medicines are reviewed with the patient today.  The patient Has no concerns regarding medicines.  The following changes have been made:  See above Labs/ tests ordered today include:see above  Disposition:   FU:  see above  Signed, Nada Boozer, NP  01/15/2017 3:36 PM    [facility] [address] [place], [place], [place]  [address]/ [address] [place],  [place] Phone: [phone]; Fax: [phone]  [phone]

## 2017-01-15 NOTE — Patient Instructions (Addendum)
Medication Instructions: Your physician has recommended you make the following change in your medication:  -1) TAKE Metolazone on Monday and Friday ONLY  Labwork: Your physician recommends that you return for lab work in 3 WEEKS (Feb 05, 2017) for a BMET - May have at Dr. Lesia Sago office - RX given to you   Procedures/Testing: None Ordered  Follow-Up: Your physician recommends that you keep your scheduled follow-up appointment on February 26, 2017 with Dr. Anne Fu   If you need a refill on your cardiac medications before your next appointment, please call your pharmacy.

## 2017-01-19 DIAGNOSIS — R8290 Unspecified abnormal findings in urine: Secondary | ICD-10-CM | POA: Diagnosis not present

## 2017-01-19 DIAGNOSIS — N898 Other specified noninflammatory disorders of vagina: Secondary | ICD-10-CM | POA: Diagnosis not present

## 2017-02-05 DIAGNOSIS — Z79899 Other long term (current) drug therapy: Secondary | ICD-10-CM | POA: Diagnosis not present

## 2017-02-17 DIAGNOSIS — I10 Essential (primary) hypertension: Secondary | ICD-10-CM | POA: Diagnosis not present

## 2017-02-17 DIAGNOSIS — G301 Alzheimer's disease with late onset: Secondary | ICD-10-CM | POA: Diagnosis not present

## 2017-02-17 DIAGNOSIS — I5022 Chronic systolic (congestive) heart failure: Secondary | ICD-10-CM | POA: Diagnosis not present

## 2017-02-26 ENCOUNTER — Ambulatory Visit (INDEPENDENT_AMBULATORY_CARE_PROVIDER_SITE_OTHER): Payer: Medicare Other | Admitting: Cardiology

## 2017-02-26 ENCOUNTER — Encounter: Payer: Self-pay | Admitting: Cardiology

## 2017-02-26 ENCOUNTER — Encounter (INDEPENDENT_AMBULATORY_CARE_PROVIDER_SITE_OTHER): Payer: Self-pay

## 2017-02-26 VITALS — BP 120/70 | HR 76 | Ht 66.0 in | Wt 218.2 lb

## 2017-02-26 DIAGNOSIS — I350 Nonrheumatic aortic (valve) stenosis: Secondary | ICD-10-CM

## 2017-02-26 DIAGNOSIS — I10 Essential (primary) hypertension: Secondary | ICD-10-CM

## 2017-02-26 DIAGNOSIS — R413 Other amnesia: Secondary | ICD-10-CM | POA: Diagnosis not present

## 2017-02-26 NOTE — Progress Notes (Signed)
[address] [place], [place]  [address] Phone: [phone]) [phone] Fax:  [phone]   02/26/2017   ID:  [name], DOB [birth year], MRN [id number]  PCP:  Merlene LaughterStoneking, Hal, MD   History of Present Illness: [name] is a 83 y.o. female with severe aortic stenosis here for followup.  She was seen by Dr. Clifton JamesMcAlhany and TAVR team, not felt to be a candidate for TAVR.  She has been experiencing advancement in memory impairment. She tolerated left knee replacement in May of 2015. Nuclear stress test performed at that time was low risk overall.   Although she does not have any evidence of syncope or angina, she has been feeling more shortness of breath with activity. For instance when trying to keep up with her husband, she feels somewhat winded. Most of these questions and answers are being performed by the husband. She denies any history of prior stroke. No prior cardiovascular history such as MI. She is also struggling at times with sleep. She gets up in the morning than once to take a nap immediately.  She saw Dr. Donette LarryHusain who gave her Lasix.  [place] from [place], her husband is from [place]. She is currently on Namzaric, memory impairment. This seems moderate and perhaps advancing.   She has fallen, mechanical falls. No syncope. When bends over falls forward.  Overall no significant changes.   08/19/16 She is becoming more short of breath according to her husband. No recent falls. Her memory is getting worse. This is very challenging for him. Currently in clinic she appears comfortable sitting in the chair. No increased work of breathing. No syncope, no orthopnea. Chronic lower extremity ankle edema. Her husband showed a tear during this visit.  02/26/17 Dr. Pete GlatterStoneking had added metolazone and her weight was down fairly significantly.  She seems to be breathing better.  Dementia has progressed.  No syncope, no bleeding.  Wt Readings from Last 3 Encounters:    02/26/17 218 lb 3.2 oz (99 kg)  01/15/17 223 lb 1.9 oz (101.2 kg)  12/05/16 232 lb (105.2 kg)     Past Medical History:  Diagnosis Date  . Aortic stenosis   . Arthritis   . Complication of anesthesia    trouble waking up-denies  . Heart murmur   . Hepatitis    as a child" Liver infection", yellow jauncice  . History of kidney stones   . History of nephrolithiasis    as child  . Hypertension    on treatment  . Osteopenia   . Thrombocytopenia, unspecified (HCC)   . Venous insufficiency    only varicose veins    Past Surgical History:  Procedure Laterality Date  . ABDOMINAL HYSTERECTOMY     also oohorectomy ?side  . FRACTURE SURGERY     rt wrist , left hand  . JOINT REPLACEMENT  [id number]   rt and lft  . LUNG LOBECTOMY  2004   rt upper lobe  . REVISION TOTAL KNEE ARTHROPLASTY Left 10/04/2013   DR Cleophas DunkerWHITFIELD  . SHOULDER HEMI-ARTHROPLASTY  06/03/2011   Procedure: SHOULDER HEMI-ARTHROPLASTY;  Surgeon: Valeria BatmanPeter W Whitfield, MD;  [facility];  Service: Orthopedics;  Laterality: Left;  Left Hemi Shoulder Hemi-Arthroplasty   . TOTAL KNEE ARTHROPLASTY     bilateral  . TOTAL KNEE REVISION Left 10/04/2013   Procedure: TOTAL KNEE REVISION ;  Surgeon: Valeria BatmanPeter W Whitfield, MD;  [facility];  Service: Orthopedics;  Laterality: Left;  LEFT  TOTAL KNEE REVISION   . TOTAL SHOULDER ARTHROPLASTY  2005   right  . VASCULAR SURGERY  2005   varicose vein     Current Outpatient Prescriptions  Medication Sig Dispense Refill  . aspirin EC 81 MG tablet Take 81 mg by mouth daily.    . Ferrous Gluconate-C-Folic Acid (IRON-C PO) Take 65 mg by mouth daily.    . furosemide (LASIX) 40 MG tablet TK 1 T PO ALTERNATE 2 TS ONCE A DAY  5  . KRILL OIL PO Take 1 Dose by mouth daily.     . metolazone (ZAROXOLYN) 2.5 MG tablet Take 1 tablet (2.5 mg) by mouth on Monday and Friday only 10 tablet 4  . Multiple Vitamins-Minerals (PRESERVISION AREDS 2 PO) Take 1 tablet by mouth daily.    Marland Kitchen NAMZARIC 28-10 MG  CP24 Take 1 capsule by mouth daily.    . Naproxen Sod-Diphenhydramine (ALEVE PM PO) Take 1 tablet by mouth 2 (two) times daily as needed (pain).      No current facility-administered medications for this visit.     Allergies:    Allergies  Allergen Reactions  . Lisinopril Cough  . Micardis [Telmisartan] Other (See Comments)    dizziness    Social History:  The patient  reports that she has never smoked. She has never used smokeless tobacco. She reports that she drinks about 0.5 oz of alcohol per week . She reports that she does not use drugs.   Family History  Problem Relation Age of Onset  . Diabetes Brother   . Anesthesia problems Neg Hx   . Hypotension Neg Hx   . Malignant hyperthermia Neg Hx   . Pseudochol deficiency Neg Hx     ROS:  Please see the history of present illness.   denies any bleeding, syncope, orthopnea, PND, rash. No chest pain   All other systems reviewed and negative.   PHYSICAL EXAM: VS:  BP 120/70   Pulse 76   Ht 5\' 6"  (1.676 m)   Wt 218 lb 3.2 oz (99 kg)   LMP  (LMP Unknown)   SpO2 97%   BMI 35.22 kg/m  GEN: Well nourished, well developed, in no acute distress  HEENT: normal  Neck: no JVD, carotid bruits, or masses Cardiac: RRR; 3/6 SM,no rubs, or gallops,no edema  Respiratory:  clear to auscultation bilaterally, normal work of breathing GI: soft, nontender, nondistended, + BS MS: no deformity or atrophy  Skin: warm and dry, no rash Neuro:  Alert  Psych: Memory impairment noted    EKG:  EKG 01/21/16-sinus rhythm, LAFB personally viewed-no significant change from prior-01/21/16-sinus rhythm, LAFB 01/15/15-ectopic atrial rhythm, premature atrial contraction, left anterior fascicular block, poor R-wave progression, LVH. 08/25/13-sinus rhythm, 65, poor R wave progression, question possible limb lead reversal. Left posterior fascicular block possible.   ECHO: 07/16/15:  - Left ventricle: The cavity size was normal. Wall thickness was  increased  in a pattern of mild LVH. Systolic function was normal.  The estimated ejection fraction was in the range of 55% to 60%.  Wall motion was normal; there were no regional wall motion  abnormalities. Doppler parameters are consistent with abnormal  left ventricular relaxation (grade 1 diastolic dysfunction).  Doppler parameters are consistent with high ventricular filling  pressure. - Aortic valve: Valve mobility was restricted. There was severe  stenosis. There was trivial regurgitation. - Ascending aorta: The ascending aorta was mildly dilated. - Mitral valve: Calcified annulus. There was mild to moderate  regurgitation. -  Left atrium: The atrium was moderately dilated. - Pulmonary arteries: Systolic pressure was mildly increased. PA  peak pressure: 32 mm Hg (S).  Impressions:  - Normal LV systolic function; grade 1 diastolic dysfunction;  elevated LV filling pressure; mild LVH; moderate LAE; calcfied  aortic valve with severe AS (mean gradient 41 mmHg) and trace AI;  mild to moderate MR; trace TR with mildly elevated pulmonary  pressure.   ASSESSMENT AND PLAN:  Severe aortic stenosis-echocardiogram once again reviewed.  Continues to be severe.  Shortness of breath seems to be improved with the addition of metolazone on Monday and Friday only.  Dr. Pete Glatter has been checking blood work, creatinine 1.3, potassium 5.  Excellent.  Weight loss has been significant.  Much of this is fluid weight.    Unfortunately, not TAVR or surgical candidate. Memory impairment seems to be advancing.  Hypertension, essential-continue to treat. Medications reviewed, no changes.  Medications okay.  Memory impairment-she is on new medicine called Namzaric. Dr. Pete Glatter has been monitoring. Her husband reports that this has been advancing. Continuing to work with Dr. Pete Glatter. No change  We'll see back in 4 months or sooner if necessary.  Signed, Donato Schultz, MD [facility]  02/26/2017 3:56 PM

## 2017-02-26 NOTE — Patient Instructions (Signed)

## 2017-03-04 DIAGNOSIS — H353212 Exudative age-related macular degeneration, right eye, with inactive choroidal neovascularization: Secondary | ICD-10-CM | POA: Diagnosis not present

## 2017-03-04 DIAGNOSIS — H353121 Nonexudative age-related macular degeneration, left eye, early dry stage: Secondary | ICD-10-CM | POA: Diagnosis not present

## 2017-03-10 DIAGNOSIS — I5022 Chronic systolic (congestive) heart failure: Secondary | ICD-10-CM | POA: Diagnosis not present

## 2017-03-10 DIAGNOSIS — I10 Essential (primary) hypertension: Secondary | ICD-10-CM | POA: Diagnosis not present

## 2017-03-10 DIAGNOSIS — I350 Nonrheumatic aortic (valve) stenosis: Secondary | ICD-10-CM | POA: Diagnosis not present

## 2017-03-10 DIAGNOSIS — G301 Alzheimer's disease with late onset: Secondary | ICD-10-CM | POA: Diagnosis not present

## 2017-04-15 DIAGNOSIS — H353132 Nonexudative age-related macular degeneration, bilateral, intermediate dry stage: Secondary | ICD-10-CM | POA: Diagnosis not present

## 2017-04-15 DIAGNOSIS — H2513 Age-related nuclear cataract, bilateral: Secondary | ICD-10-CM | POA: Diagnosis not present

## 2017-07-07 DIAGNOSIS — Z79899 Other long term (current) drug therapy: Secondary | ICD-10-CM | POA: Diagnosis not present

## 2017-07-07 DIAGNOSIS — I129 Hypertensive chronic kidney disease with stage 1 through stage 4 chronic kidney disease, or unspecified chronic kidney disease: Secondary | ICD-10-CM | POA: Diagnosis not present

## 2017-07-07 DIAGNOSIS — I350 Nonrheumatic aortic (valve) stenosis: Secondary | ICD-10-CM | POA: Diagnosis not present

## 2017-07-07 DIAGNOSIS — I5022 Chronic systolic (congestive) heart failure: Secondary | ICD-10-CM | POA: Diagnosis not present

## 2017-07-07 DIAGNOSIS — N183 Chronic kidney disease, stage 3 (moderate): Secondary | ICD-10-CM | POA: Diagnosis not present

## 2017-07-07 DIAGNOSIS — I10 Essential (primary) hypertension: Secondary | ICD-10-CM | POA: Diagnosis not present

## 2017-07-07 DIAGNOSIS — G301 Alzheimer's disease with late onset: Secondary | ICD-10-CM | POA: Diagnosis not present

## 2017-08-12 DIAGNOSIS — H2512 Age-related nuclear cataract, left eye: Secondary | ICD-10-CM | POA: Diagnosis not present

## 2017-08-25 ENCOUNTER — Encounter: Payer: Self-pay | Admitting: *Deleted

## 2017-08-26 ENCOUNTER — Ambulatory Visit: Payer: Medicare Other | Admitting: Anesthesiology

## 2017-08-26 ENCOUNTER — Encounter
Admission: RE | Disposition: A | Discharge status: Home / Self Care | Payer: Self-pay | Source: Ambulatory Visit | Attending: Ophthalmology

## 2017-08-26 ENCOUNTER — Ambulatory Visit
Admission: RE | Admit: 2017-08-26 | Discharge: 2017-08-26 | Disposition: A | Discharge status: Home / Self Care | Payer: Medicare Other | Source: Ambulatory Visit | Attending: Ophthalmology | Admitting: Ophthalmology

## 2017-08-26 DIAGNOSIS — I10 Essential (primary) hypertension: Secondary | ICD-10-CM | POA: Diagnosis not present

## 2017-08-26 DIAGNOSIS — E669 Obesity, unspecified: Secondary | ICD-10-CM | POA: Diagnosis not present

## 2017-08-26 DIAGNOSIS — F0390 Unspecified dementia without behavioral disturbance: Secondary | ICD-10-CM | POA: Insufficient documentation

## 2017-08-26 DIAGNOSIS — Z6839 Body mass index (BMI) 39.0-39.9, adult: Secondary | ICD-10-CM | POA: Insufficient documentation

## 2017-08-26 DIAGNOSIS — H2512 Age-related nuclear cataract, left eye: Secondary | ICD-10-CM | POA: Diagnosis not present

## 2017-08-26 DIAGNOSIS — Z96653 Presence of artificial knee joint, bilateral: Secondary | ICD-10-CM | POA: Insufficient documentation

## 2017-08-26 DIAGNOSIS — D649 Anemia, unspecified: Secondary | ICD-10-CM | POA: Diagnosis not present

## 2017-08-26 DIAGNOSIS — E119 Type 2 diabetes mellitus without complications: Secondary | ICD-10-CM | POA: Diagnosis not present

## 2017-08-26 DIAGNOSIS — Z79899 Other long term (current) drug therapy: Secondary | ICD-10-CM | POA: Diagnosis not present

## 2017-08-26 HISTORY — DX: Edema, unspecified: R60.9

## 2017-08-26 HISTORY — PX: CATARACT EXTRACTION W/PHACO: SHX586

## 2017-08-26 HISTORY — DX: Anemia, unspecified: D64.9

## 2017-08-26 SURGERY — PHACOEMULSIFICATION, CATARACT, WITH IOL INSERTION
Anesthesia: Monitor Anesthesia Care | Site: Eye | Laterality: Left | Wound class: "Clean "

## 2017-08-26 MED ORDER — FENTANYL CITRATE (PF) 100 MCG/2ML IJ SOLN
INTRAMUSCULAR | Status: DC | PRN
  Administered 2017-08-26 (×3): 25 ug via INTRAVENOUS

## 2017-08-26 MED ORDER — LIDOCAINE HCL (PF) 4 % IJ SOLN
INTRAOCULAR | Status: DC | PRN
  Administered 2017-08-26: 4 mL via OPHTHALMIC

## 2017-08-26 MED ORDER — NEOMYCIN-POLYMYXIN-DEXAMETH 3.5-10000-0.1 OP OINT
TOPICAL_OINTMENT | OPHTHALMIC | Status: AC
  Filled 2017-08-26: qty 3.5

## 2017-08-26 MED ORDER — CARBACHOL 0.01 % IO SOLN
INTRAOCULAR | Status: DC | PRN
  Administered 2017-08-26: 0.5 mL via INTRAOCULAR

## 2017-08-26 MED ORDER — NEOMYCIN-POLYMYXIN-DEXAMETH 0.1 % OP OINT
TOPICAL_OINTMENT | OPHTHALMIC | Status: DC | PRN
  Administered 2017-08-26: 1 via OPHTHALMIC

## 2017-08-26 MED ORDER — ARMC OPHTHALMIC DILATING DROPS
OPHTHALMIC | Status: AC
  Administered 2017-08-26: 1 via OPHTHALMIC
  Filled 2017-08-26: qty 0.4

## 2017-08-26 MED ORDER — NA HYALUR & NA CHOND-NA HYALUR 0.4-0.35 ML IO KIT
PACK | INTRAOCULAR | Status: DC | PRN
  Administered 2017-08-26: .35 mL via INTRAOCULAR

## 2017-08-26 MED ORDER — ARMC OPHTHALMIC DILATING DROPS
1.0000 "application " | OPHTHALMIC | Status: AC
  Administered 2017-08-26 (×3): 1 via OPHTHALMIC

## 2017-08-26 MED ORDER — POVIDONE-IODINE 5 % OP SOLN
OPHTHALMIC | Status: AC
  Filled 2017-08-26: qty 30

## 2017-08-26 MED ORDER — EPINEPHRINE PF 1 MG/ML IJ SOLN
INTRAMUSCULAR | Status: DC | PRN
  Administered 2017-08-26: 09:00:00 via OPHTHALMIC

## 2017-08-26 MED ORDER — POVIDONE-IODINE 5 % OP SOLN
OPHTHALMIC | Status: DC | PRN
  Administered 2017-08-26: 1 via OPHTHALMIC

## 2017-08-26 MED ORDER — FENTANYL CITRATE (PF) 100 MCG/2ML IJ SOLN
INTRAMUSCULAR | Status: AC
  Filled 2017-08-26: qty 2

## 2017-08-26 MED ORDER — EPINEPHRINE PF 1 MG/ML IJ SOLN
INTRAMUSCULAR | Status: AC
  Filled 2017-08-26: qty 2

## 2017-08-26 MED ORDER — LIDOCAINE HCL (PF) 4 % IJ SOLN
INTRAMUSCULAR | Status: AC
  Filled 2017-08-26: qty 5

## 2017-08-26 MED ORDER — MIDAZOLAM HCL 2 MG/2ML IJ SOLN
INTRAMUSCULAR | Status: AC
  Filled 2017-08-26: qty 2

## 2017-08-26 MED ORDER — NA HYALUR & NA CHOND-NA HYALUR 0.55-0.5 ML IO KIT
PACK | INTRAOCULAR | Status: AC
  Filled 2017-08-26: qty 1.05

## 2017-08-26 MED ORDER — MOXIFLOXACIN HCL 0.5 % OP SOLN
OPHTHALMIC | Status: AC
  Administered 2017-08-26: 1 [drp] via OPHTHALMIC
  Filled 2017-08-26: qty 3

## 2017-08-26 MED ORDER — MOXIFLOXACIN HCL 0.5 % OP SOLN
1.0000 [drp] | OPHTHALMIC | Status: AC
  Administered 2017-08-26 (×3): 1 [drp] via OPHTHALMIC

## 2017-08-26 MED ORDER — SODIUM CHLORIDE 0.9 % IV SOLN
INTRAVENOUS | Status: DC
  Administered 2017-08-26: 08:00:00 via INTRAVENOUS

## 2017-08-26 SURGICAL SUPPLY — 18 items
GLOVE BIO SURGEON STRL SZ8 (GLOVE) ×3 IMPLANT
GLOVE BIOGEL M 6.5 STRL (GLOVE) ×3 IMPLANT
GLOVE SURG LX 7.5 STRW (GLOVE) ×2
GLOVE SURG LX STRL 7.5 STRW (GLOVE) ×1 IMPLANT
GOWN STRL REUS W/ TWL LRG LVL3 (GOWN DISPOSABLE) ×2 IMPLANT
GOWN STRL REUS W/TWL LRG LVL3 (GOWN DISPOSABLE) ×4
LABEL CATARACT MEDS ST (LABEL) ×3 IMPLANT
LENS IOL TECNIS ITEC 23.5 (Intraocular Lens) ×2 IMPLANT
NDL HPO THNWL 1X22GA REG BVL (NEEDLE) ×1 IMPLANT
NEEDLE SAFETY 22GX1 (NEEDLE) ×2
PACK CATARACT (MISCELLANEOUS) ×3 IMPLANT
PACK CATARACT BRASINGTON LX (MISCELLANEOUS) ×3 IMPLANT
PACK EYE AFTER SURG (MISCELLANEOUS) ×3 IMPLANT
SOL BSS BAG (MISCELLANEOUS) ×3
SOLUTION BSS BAG (MISCELLANEOUS) ×1 IMPLANT
SYR 5ML LL (SYRINGE) ×3 IMPLANT
WATER STERILE IRR 250ML POUR (IV SOLUTION) ×3 IMPLANT
WIPE NON LINTING 3.25X3.25 (MISCELLANEOUS) ×3 IMPLANT

## 2017-08-26 NOTE — Anesthesia Post-op Follow-up Note (Signed)
Anesthesia QCDR form completed.        

## 2017-08-26 NOTE — Anesthesia Preprocedure Evaluation (Addendum)
Anesthesia Evaluation  Patient identified by MRN, date of birth, ID band Patient awake    Reviewed: Allergy & Precautions, H&P , NPO status , reviewed documented beta blocker date and time   History of Anesthesia Complications (+) Emergence Delirium and history of anesthetic complications  Airway Mallampati: III  TM Distance: >3 FB Neck ROM: limited    Dental  (+) Upper Dentures   Pulmonary    Pulmonary exam normal        Cardiovascular hypertension, Normal cardiovascular exam+ Valvular Problems/Murmurs AS   Study Conclusions  - Left ventricle: The cavity size was normal. Wall thickness was   increased in a pattern of mild LVH. Systolic function was normal.   The estimated ejection fraction was in the range of 55% to 60%.   Wall motion was normal; there were no regional wall motion   abnormalities. Doppler parameters are consistent with abnormal   left ventricular relaxation (grade 1 diastolic dysfunction).   Doppler parameters are consistent with high ventricular filling   pressure. - Aortic valve: Valve mobility was restricted. There was severe   stenosis. There was trivial regurgitation. - Ascending aorta: The ascending aorta was mildly dilated. - Mitral valve: Calcified annulus. There was mild to moderate   regurgitation. - Left atrium: The atrium was moderately dilated. - Pulmonary arteries: Systolic pressure was mildly increased. PA   peak pressure: 32 mm Hg (S).  Impressions:  - Normal LV systolic function; grade 1 diastolic dysfunction;   elevated LV filling pressure; mild LVH; moderate LAE; calcfied   aortic valve with severe AS (mean gradient 41 mmHg) and trace AI;   mild to moderate MR; trace TR with mildly elevated pulmonary   pressure.   Neuro/Psych PSYCHIATRIC DISORDERS Dementia    GI/Hepatic (+) Hepatitis -Jaundiced as child   Endo/Other    Renal/GU      Musculoskeletal  (+) Arthritis ,   Abdominal   Peds  Hematology  (+) anemia ,   Anesthesia Other Findings Osteoarthritis of shoulder Hypertension  Obesity, Class II, BMI 35-39.9, with comorbidity  Aortic stenosis, severe Thrombocytopenia (HCC) S/P total knee replacement using cement Postoperative anemia due to acute blood loss Knee pain, left Dementia  Reproductive/Obstetrics                            Anesthesia Physical Anesthesia Plan  ASA: IV  Anesthesia Plan: MAC   Post-op Pain Management:    Induction:   PONV Risk Score and Plan: TIVA  Airway Management Planned:   Additional Equipment:   Intra-op Plan:   Post-operative Plan:   Informed Consent: I have reviewed the patients History and Physical, chart, labs and discussed the procedure including the risks, benefits and alternatives for the proposed anesthesia with the patient or authorized representative who has indicated his/her understanding and acceptance.   Dental Advisory Given  Plan Discussed with: CRNA  Anesthesia Plan Comments:        Anesthesia Quick Evaluation

## 2017-08-26 NOTE — Anesthesia Postprocedure Evaluation (Signed)
Anesthesia Post Note  Patient: [name]  Procedure(s) Performed: CATARACT EXTRACTION PHACO AND INTRAOCULAR LENS PLACEMENT (IOC) (Left Eye)  Patient location during evaluation: PACU Anesthesia Type: MAC Level of consciousness: awake Pain management: pain level controlled Vital Signs Assessment: post-procedure vital signs reviewed and stable Respiratory status: spontaneous breathing Cardiovascular status: blood pressure returned to baseline Postop Assessment: no apparent nausea or vomiting Anesthetic complications: no     Last Vitals:  Vitals:   08/26/17 0802  BP: 111/79  Pulse: 80  Resp: 18  Temp: (!) 35.9 C  SpO2: 93%    Last Pain:  Vitals:   08/26/17 0802  TempSrc: Tympanic                 Carron Curie

## 2017-08-26 NOTE — H&P (Signed)
The History and Physical notes are on paper, have been signed, and are to be scanned. The patient remains stable and unchanged from the H&P.   Previous H&P reviewed, patient examined, and there are no changes.  [name] 08/26/2017 8:45 AM

## 2017-08-26 NOTE — Discharge Instructions (Signed)
Eye Surgery Discharge Instructions  Expect mild scratchy sensation or mild soreness. DO NOT RUB YOUR EYE!  The day of surgery:  Minimal physical activity, but bed rest is not required  No reading, computer work, or close hand work  No bending, lifting, or straining.  May watch TV  For 24 hours:  No driving, legal decisions, or alcoholic beverages  Safety precautions  Eat anything you prefer: It is better to start with liquids, then soup then solid foods.  _____ Eye patch should be worn until postoperative exam tomorrow.  ____ Solar shield eyeglasses should be worn for comfort in the sunlight/patch while sleeping  Resume all regular medications including aspirin or Coumadin if these were discontinued prior to surgery. You may shower, bathe, shave, or wash your hair. Tylenol may be taken for mild discomfort.  Call your doctor if you experience significant pain, nausea, or vomiting, fever > 101 or other signs of infection. [phone] or [phone] Specific instructions:  Follow-up Information    Lockie Mola, MD Follow up.   Specialty:  Ophthalmology Why:  May 2 at 10:20am Contact information: [address]   [place] [address] [phone]

## 2017-08-26 NOTE — Transfer of Care (Signed)
Immediate Anesthesia Transfer of Care Note  Patient: [name]  Procedure(s) Performed: CATARACT EXTRACTION PHACO AND INTRAOCULAR LENS PLACEMENT (IOC) (Left Eye)  Patient Location: PACU  Anesthesia Type:MAC  Level of Consciousness: awake  Airway & Oxygen Therapy: Patient Spontanous Breathing  Post-op Assessment: Report given to RN  Post vital signs: stable  Last Vitals:  Vitals Value Taken Time  BP    Temp    Pulse    Resp    SpO2      Last Pain:  Vitals:   08/26/17 0802  TempSrc: Tympanic         Complications: No apparent anesthesia complications

## 2017-08-26 NOTE — Op Note (Signed)
OPERATIVE NOTE  [name] [id number] 08/26/2017   PREOPERATIVE DIAGNOSIS:  Nuclear sclerotic cataract left eye. H25.12   POSTOPERATIVE DIAGNOSIS:    Nuclear sclerotic cataract left eye.     PROCEDURE:  Phacoemusification with posterior chamber intraocular lens placement of the left eye   LENS:   Implant Name Type Inv. Item Serial No. Manufacturer Lot No. LRB No. Used  LENS IOL DIOP 23.5 - [id number] Intraocular Lens LENS IOL DIOP 23.5 [id number] AMO  Left 1        ULTRASOUND TIME: 16  % of 0 minutes 44 seconds, CDE 9.9  SURGEON:  Deirdre Evener, MD   ANESTHESIA:  Topical with tetracaine drops and 2% Xylocaine jelly, augmented with 1% preservative-free intracameral lidocaine.    COMPLICATIONS:  None.   DESCRIPTION OF PROCEDURE:  The patient was identified in the holding room and transported to the operating room and placed in the supine position under the operating microscope.  The left eye was identified as the operative eye and it was prepped and draped in the usual sterile ophthalmic fashion.   A 1 millimeter clear-corneal paracentesis was made at the 1:30 position.  0.5 ml of preservative-free 1% lidocaine was injected into the anterior chamber.  The anterior chamber was filled with Viscoat viscoelastic.  A 2.4 millimeter keratome was used to make a near-clear corneal incision at the 10:30 position.  .  A curvilinear capsulorrhexis was made with a cystotome and capsulorrhexis forceps.  Balanced salt solution was used to hydrodissect and hydrodelineate the nucleus.   Phacoemulsification was then used in stop and chop fashion to remove the lens nucleus and epinucleus.  The remaining cortex was then removed using the irrigation and aspiration handpiece. Provisc was then placed into the capsular bag to distend it for lens placement.  A lens was then injected into the capsular bag.  The remaining viscoelastic was aspirated.   Wounds were hydrated with balanced salt  solution.  The anterior chamber was inflated to a physiologic pressure with balanced salt solution.  No wound leaks were noted. Vigamox 0.2 ml of a 1mg  per ml solution was injected into the anterior chamber for a dose of 0.2 mg of intracameral antibiotic at the completion of the case.   Timolol and Brimonidine drops were applied to the eye.  The patient was taken to the recovery room in stable condition without complications of anesthesia or surgery.  [name] 08/26/2017, 9:31 AM

## 2017-08-31 DIAGNOSIS — G301 Alzheimer's disease with late onset: Secondary | ICD-10-CM | POA: Diagnosis not present

## 2017-08-31 DIAGNOSIS — I350 Nonrheumatic aortic (valve) stenosis: Secondary | ICD-10-CM | POA: Diagnosis not present

## 2017-08-31 DIAGNOSIS — B029 Zoster without complications: Secondary | ICD-10-CM | POA: Diagnosis not present

## 2017-09-20 DIAGNOSIS — B0229 Other postherpetic nervous system involvement: Secondary | ICD-10-CM | POA: Diagnosis not present

## 2017-09-24 ENCOUNTER — Other Ambulatory Visit: Payer: Self-pay

## 2017-09-24 NOTE — Patient Outreach (Signed)
[facility]) Care Management  09/24/2017  [name] [birth year] [id number]   Telephone Screen  Referral Date: 09/24/17 Referral Source:  MD office Referral Reason: "HF,HTN, patient has dementia, husband having hard time alone managing" Insurance: Medicare   Outreach attempt # 1 to patient. Spoke with spouse who speaks for patient given her mental status(ROI on file). Spouse voiced that this was not a good time for him to talk as he was providing care to patient and requested a call back at another time.       Plan: RN CM will make outreach attempt to patient/spouse within three business days.  RN CM will send unsuccessful outreach letter to patient.  Antionette Fairy, RN,BSN,CCM [facility] Care Management Telephonic Care Management Coordinator Direct Phone: [phone] Toll Free: [phone] Fax: [phone]

## 2017-09-24 NOTE — Patient Outreach (Signed)
[facility]) Care Management  09/24/2017  [name] [birth year] [id number]    Telephone Screen  Referral Date: 09/24/17 Referral Source:  MD office Referral Reason: "HF,HTN, patient has dementia, husband having hard time alone managing" Insurance: Medicare    Outreach attempt to spouse. Spoke with spouse and screening completed.   Social: Patient resides in her home along with her spouse. He is the primary caregiver for patient. Spouse voiced multiple times throughout call with RN CM caregiver fatigue and burnout. He states that his sister lives nearby and is helping him manage patient's care. However, he is still in need of further assistance and support. He voices that although patient has worsening dementia she is still able to carry on meaningful conversation but does not recall the conversation. She requires assistance with ADLs/IADLs. Spouse voices that due to his "culture and background" he was raised to not "assist women with bathing and personal care."  He also states that patient is "[place]" and their culture is for them to be "private" people. Patient able to walk and uses walker. Spouse states that patient sustained a fall a few weeks ago. He is concerned about her safety and future falls as her mobility is slightly impaired. Spouse drives patient to all appts.    Conditions: Per chart review, patient has PMH of Alzheimer's disease-late onset, severe aortic stenosis, HTN,CHF, CKD stage 3, obesity, OA and cataracts(recent surgery earlier this month). Spouse voices that he does not monitor BP in the home. He reports that patient was diagnosed with Alzheimer's about a year ago. She has severe aortic stenosis and given her medical status is not a candidate for surgery. He states that she has SOB at times and MD notes indicate SOB with minimal exertion. Spouse voices that patient was diagnosed with shingles about two weeks ago. She did receive vaccine. She was  treated with meds but still has shingles. Spouse states that he was told by MD that "meds could only be taken for six days." He has been applying Calamine lotion and Asper cream to affected area. He voices that he tried Lidocaine patch but it made the matter worse. Spouse is very anxious and concerned regarding patient's multiple medical issues and how he will be able to manage her care. He states that he desires to keep her in the home as he does not like the idea of nursing home placement.   Medications: Per spouse patient taking less than 10 meds. He denies any issues affording and/or managing meds.  Appointments: Patient followed by PCP and saw him a few weeks ago. She is also followed by cardiologist-Dr. Anne Fu.   Advance Directives: Spouse voices that patient has living will and DNR. He voices that he was told to place "DNR form "in the freezer." RN CM asked multiple times to ensure spouse did not mean "on" (outside) of freezer/fridge. He voiced that he was told by MD to place "in the freezer" as EMS knows to look there for the forms in the home as placing it on the outside of freezer increased the chances of someone taking it down and removing it.   Consent: [facility] services reviewed and discussed with spouse. Spouse gave verbal consent and is interested in any services/support he can get to help care for patient.  Plan: RN CM will send [facility] SW referral for possible in home support resources/assistance to assist with caregiver burnout. RN CM will send [facility] community RN referral for further in home eval/assessment of care needs  and mgmt of chronic conditions.    Antionette Fairy, RN,BSN,CCM [facility] Care Management Telephonic Care Management Coordinator Direct Phone: [phone] Toll Free: [phone] Fax: [phone]

## 2017-09-25 ENCOUNTER — Encounter: Payer: Self-pay | Admitting: *Deleted

## 2017-09-25 ENCOUNTER — Other Ambulatory Visit: Payer: Self-pay | Admitting: *Deleted

## 2017-09-25 NOTE — Patient Outreach (Signed)
[facility]) Care Management  09/25/2017  Teraji Paluck [birth year] [id number]   MD referral received on 5/30    RN briefly spoke with pt's spouse Tad Moore) who indicated pt was no available and requested RN to call back if possible later today. RN will attempt another outreach call to pt later today for pending services.  Elliot Cousin, RN Care Management Coordinator [facility] Main Office [phone]

## 2017-09-25 NOTE — Patient Outreach (Signed)
[facility]) Care Management  09/25/2017  Verlisa Tackett 12-Mar-1931 [id number]   CSW was able to make initial contact with patient's husband, Nakaiya Babauta today to perform the phone assessment on patient, as well as assess and assist with social work needs and services.  CSW introduced self, explained role and types of services provided through [employer] Care Management [facility] Care Management).  CSW further explained to Mr. Huestis that CSW works with patient's Telephonic RNCM, also with [facility] Care Management, Doristine Section Florance. CSW then explained the reason for the call, indicating that Mrs. Florance thought that patient would benefit from social work services and resources to assist with arranging for in-home health services for patient.  CSW obtained two HIPAA compliant identifiers from Mr. Peruzzi, which included patient's name and date of birth. Mr. Sanor reports that he is in definite need of assistance with giving patient a bath several times per day.  Mr. Zaccaria indicated that he is able to assist patient with all other activities of daily living, as well as perform light housekeeping duties, meal preparation, grocery shopping, laundry, medication administration, etc.  CSW agreed to mail Mr. Sidman a list of home health agencies that may be able to assist with his request.  Mr. 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  Mr. Tlatelpa  Is aware that in-home aide services are not covered by insurance and that these services would be an out-of-pocket expense.  CSW will mail Mr. Pilat the list today and then follow-up in one week to ensure that he received the information, as well as answer any question he may have at that time. THN CM Care Plan Problem One     Most Recent Value  Care Plan Problem One  Patient is in need of a bath  aide several days per week.  Role Documenting the Problem One  Clinical Social Worker  Care Plan for Problem One  Active  [facility] Long Term Goal   Patient will have an in-home care bath aide in place within the next 45 days.  THN Long Term Goal Start Date  09/25/17  Interventions for Problem One Long Term Goal  CSW will assist patient and patient's husband with arranging in-home bath aide services for patient in the home.  [facility]N CM Short Term Goal #1   Patient and patient's husband will review the list of in-home care services provided to them by CSW, within the next two weeks.  [facility]N CM Short Term Goal #1 Start Date  09/25/17  Interventions for Short Term Goal #1  CSW will mail patient and patient's husband a complete list of home health agencies in [place].  THN CM Short Term Goal #2   Patient and patient's husband will decide on a in-home health agency of choice to provide bath aide services, within the next three weeks.  [facility]N CM Short Term Goal #2 Start Date  09/25/17  Interventions for Short Term Goal #2  CSW will assist patient and patient's wife with deciding on a in-home health agency of choice.     Danford Bad, BSW, MSW, LCSW  Licensed Restaurant manager, fast food Health System  Mailing [place] [address] [place], [place] [address] Physical Address-[address] [place], [place] [address] Toll Free Main # [phone] Fax # [phone] Cell # [phone]  Office #  [email or web address]

## 2017-09-28 ENCOUNTER — Other Ambulatory Visit: Payer: Self-pay | Admitting: *Deleted

## 2017-09-28 ENCOUNTER — Encounter: Payer: Self-pay | Admitting: *Deleted

## 2017-09-28 NOTE — Patient Outreach (Signed)
[facility]) Care Management  09/28/2017  [name] 1931/01/13 [id number]    Telephone Assessment   Rn spoke with pt and was provided permission to speak with spouse [name]). Caregiver indicated his issues with managing the pt's care. Requested aide assistance as RN reiterated on the last conversation he had with the [facility] SW on 5/31 with the same request. Caregiver informed that a list of in-home adie services was mailed for these requested services. RN further introduced the [facility] services and inquired on any other assistance needed for this pt. Spouse mentioned pt currently has Shingles and continues to request him to rub the area but he states she can only address this in the shower but again states it is difficult to get the pt in and out of the shower without assistance. Caregiver states he has 3 calls into the provider's office (Dr. Pete Glatter) and was informed they would call him back (last call made today). Caregiver wants the provider's office to again address the pt's Shingles. RN discussed Shingles and informed the caregiver that this is a medical condition that the provider would have to address concerning any other his request at this time. Based upon the caregiver status at this time of being exhausted RN discussed levels of care and assistance with facility if considering placement in the future. RN discussed Dementia vs. Alzheimer's  Concerning safety, coping tips and obtaining support in the home if financially possible. Caregiver is aware the [facility] does not cover some expenses for in-home services to address pt's ADL with bath services however if he is concerned he can contact his insurance carrier and inquire on coverage for such services based upon his mention of a supplement. Caregiver very appreciative and grateful for the information. Again RN offered possible one-on-one home visit to address safety in the home possible a home evaluation or any other needs that may  need to be addressed. Based upon his request all inquires would be for pt's primary provider to address. RN strongly encouraged the caregiver to mention to the provider's office when they call back to address if possible. RN provider RN case manager contact along with the involved BSW ([place]). Will also update this BSW of today's conversation and the concerns the caregiver has inquired about. Will alert Dr. Pete Glatter of accordingly concerning this disciplinary disposition with this pt.  Elliot Cousin, RN Care Management Coordinator [facility] Main Office [phone]

## 2017-10-01 ENCOUNTER — Encounter: Payer: Self-pay | Admitting: *Deleted

## 2017-10-01 ENCOUNTER — Other Ambulatory Visit: Payer: Self-pay | Admitting: *Deleted

## 2017-10-01 NOTE — Patient Outreach (Signed)
[facility]) Care Management  10/01/2017  Sibyl Mikula 1930-05-12 [id number]   CSW received an incoming call from patient's daughter, [name] today to discuss in-home care services for patient.  CSW explained to Mrs. Elwyn Reach that CSW is currently working with patient's husband, Yaretsi Humphres to establish in-home care services for patient.  CSW went on to explain to Mrs. Elwyn Reach that [facility] has mailed a complete list of home health agencies, private agency sitter's, in-home care assistants, etc., to patient's home for their review.  Mr. Texidor agreed to begin contacting agencies on the list to obtain pricing and services available.  Mrs. Elwyn Reach reported that she is traveling to [place] from [place] this weekend to try and assist patient and Mrs. Rendall with arranging in-home care services for patient.  CSW explained to Mrs. Elwyn Reach that CSW plans to contact Mr. Holsclaw tomorrow to ensure that he received the packet of resource information mailed to his home by CSW.  CSW will answer any questions that Mr. Nazario may have at that time, as well as assist with calling various agencies.  Mrs. Elwyn Reach agreed to contact CSW on Monday to report findings of her progress with patient and Mr. Gillham this weekend with regards to establishing in-home care services for patient. THN CM Care Plan Problem One     Most Recent Value  Care Plan Problem One  Patient is in need of a bath aide several days per week.  Role Documenting the Problem One  Clinical Social Worker  Care Plan for Problem One  Active  [facility] Long Term Goal   Patient will have an in-home care bath aide in place within the next 45 days.  THN Long Term Goal Start Date  09/25/17  [facility] CM Short Term Goal #1   Patient and patient's husband will review the list of in-home care services provided to them by CSW, within the next two weeks.  THN CM Short Term Goal #1 Start Date  09/25/17  [facility] CM Short Term Goal #2   Patient and  patient's husband will decide on a in-home health agency of choice to provide bath aide services, within the next three weeks.  THN CM Short Term Goal #2 Start Date  09/25/17  [facility] CM Short Term Goal #3  Patient and husband will contact home health agencies on the list provided to them by CSW and contact agencies of interest to obtain pricing and services available, within the next week.  THN CM Short Term Goal #3 Start Date  10/01/17  [facility] CM Short Term Goal #3 Met Date  10/01/17  Interventions for Short Tern Goal #3  CSW will mail patient a complete list of home health agencies in [place].    Nat Christen, BSW, MSW, LCSW  Licensed Education officer, environmental [facility] System  Mailing [place] [address] [place], [place] [address] Physical Address-[address], [place], [place] [address] Toll Free Main # [phone] Fax # [phone] Cell # [phone]  Office # [phone] [email or web address]

## 2017-10-01 NOTE — Patient Outreach (Signed)
[facility]) Care Management  10/01/2017  [name] 1931-02-13 [id number]    RN attempted to contact the pt's daughter [name]?) unsuccessful and only able to leave a HIPAA voice message requesting a call back. Will await a call back from the pt's daughter concerning her request. Note voice message indicates daughter will be in town on tomorrow.   Elliot Cousin, RN Care Management Coordinator [facility] Main Office [phone]

## 2017-10-01 NOTE — Patient Outreach (Signed)
[facility]) Care Management  09/30/2017  [name] Jun 30, 1930 [id number]  RN Received a call from the pt's daughter [name]?) who has requested a possible meeting when she comes to town on Friday concerning this pt. Due to no consent in place RN contact the pt and spoke with both the pt and her spouse receiving permission to contact this daughter. Spouse indicated there is limited communication with this daughter and she was told not to call this RN due to as things have been handles. Spouse apologized for the calls received by this daughter. RN informed both the spouse and pt that I will contact this daughter and inquire on her request. Note the requested needs have been addressed with  the social worker remaining involved if there any concerns concerning his request for additional  help arise. In-home aide agencies have been mailed to this pt for the requested assistance the pt needs at this time.  Again RN verified on any other assistance along with available home visits however spouse continues to indicated no needs for this RN case management to address at this time but has information to contact [facility] directly with any future request. No other request or inquires at this time. Call ended.  Plan to outreach to this daughter tomorrow.  Elliot Cousin, RN Care Management Coordinator [facility] Main Office [phone]

## 2017-10-02 ENCOUNTER — Other Ambulatory Visit: Payer: Self-pay | Admitting: *Deleted

## 2017-10-02 NOTE — Patient Outreach (Signed)
[name]) Care Management  10/02/2017  [name] 03-07-1931 [id number]   CSW was able to make contact with patient's husband, [name] today to follow-up regarding in-home care services for patient, as well as to ensure that [name] received the list of home health agencies mailed to his home.  [name] confirmed that he received the list and that he and his daughter, [name] will be going through the list this weekend and making calls to try and arrange in-home care services for patient.  CSW agreed to follow-up with [name] next week to ensure that in-home care services have been arranged and are in place. THN CM Care Plan Problem One     Most Recent Value  Care Plan Problem One  Patient is in need of a bath aide several days per week.  Role Documenting the Problem One  Clinical Social Worker  Care Plan for Problem One  Active  [facility] Long Term Goal   Patient will have an in-home care bath aide in place within the next 45 days.  THN Long Term Goal Start Date  09/25/17  [facility] CM Short Term Goal #1   Patient and patient's husband will review the list of in-home care services provided to them by CSW, within the next two weeks.  THN CM Short Term Goal #1 Start Date  09/25/17  [facility] CM Short Term Goal #1 Met Date  10/02/17  Interventions for Short Term Goal #1  Patient's husband and daughter have received the list of home health agencies and will be calling agencies today to try and arrange for services for patient.  THN CM Short Term Goal #2   Patient and patient's husband will decide on a in-home health agency of choice to provide bath aide services, within the next three weeks.  THN CM Short Term Goal #2 Start Date  09/25/17  [facility] CM Short Term Goal #3  Patient and husband will contact home health agencies on the list provided to them by CSW and contact agencies of interest to obtain pricing and services available, within the next week.  THN CM Short Term  Goal #3 Start Date  10/01/17  [facility] CM Short Term Goal #3 Met Date  10/01/17    Nat Christen, BSW, MSW, [place]  Licensed Clinical Social Worker  [place]  Mailing [place], [place], [place] [address] Physical Address-[address], [place], [place] [address] Toll Free Main # [phone] Fax # [phone] Cell # [phone]  Office # [phone] [email or web address]

## 2017-10-07 ENCOUNTER — Other Ambulatory Visit: Payer: Self-pay | Admitting: *Deleted

## 2017-10-07 ENCOUNTER — Encounter: Payer: Self-pay | Admitting: *Deleted

## 2017-10-07 NOTE — Patient Outreach (Signed)
[facility]) Care Management  10/07/2017  [name] 02-03-1931 [id number]   CSW was able to make contact with patient's husband, [name] today to follow-up regarding social work services and resources, as well as to ensure that home health services have been arranged and are in place.  Mr. Manfre reported that they have been able to establish home health services with [facility].  [facility] is scheduled to begin in-home care services on Friday, October 09, 2017.  Patient will receive services through [facility] for three hours on Monday, Wednesday and Friday of each week. CSW will perform a case closure on patient, as all goals of treatment have been met from social work standpoint and no additional social work needs have been identified at this time.  CSW will fax an update to patient's Primary Care Physician, Dr. Lajean Manes to ensure that they are aware of CSW's involvement with patient's plan of care.  CSW will also mail a case closure to Dr. Felipa Eth. THN CM Care Plan Problem One     Most Recent Value  Care Plan Problem One  Patient is in need of a bath aide several days per week.  Role Documenting the Problem One  Clinical Social Worker  Care Plan for Problem One  Active  [facility] Long Term Goal   Patient will have an in-home care bath aide in place within the next 45 days.  THN Long Term Goal Start Date  09/25/17  THN Long Term Goal Met Date  10/07/17  Interventions for Problem One Long Term Goal  In-home care services have been arranged for patient through [facility].  THN CM Short Term Goal #1   Patient and patient's husband will review the list of in-home care services provided to them by CSW, within the next two weeks.  THN CM Short Term Goal #1 Start Date  09/25/17  [name] CM Short Term Goal #1 Met Date  10/02/17  [name] CM Short Term Goal #2   Patient and patient's husband will decide on a in-home health agency of choice to provide bath aide services,  within the next three weeks.  THN CM Short Term Goal #2 Start Date  09/25/17  [facility] CM Short Term Goal #2 Met Date  10/07/17  Interventions for Short Term Goal #2  Patient and patient's husband have decided upon [place].  THN CM Short Term Goal #3  Patient and husband will contact home health agencies on the list provided to them by CSW and contact agencies of interest to obtain pricing and services available, within the next week.  THN CM Short Term Goal #3 Start Date  10/01/17  [facility] CM Short Term Goal #3 Met Date  10/01/17    Nat Christen, BSW, MSW, [place]  Licensed Clinical Social Worker  [place]  Mailing [place]. [address] [place], [place] [address] Physical Address-[address], [place], [place] [address] Toll Free Main # [phone] Fax # [phone] Cell # [phone]  Office # [phone] [email or web address]

## 2017-10-13 DIAGNOSIS — N183 Chronic kidney disease, stage 3 (moderate): Secondary | ICD-10-CM | POA: Diagnosis not present

## 2017-10-13 DIAGNOSIS — Z79899 Other long term (current) drug therapy: Secondary | ICD-10-CM | POA: Diagnosis not present

## 2017-10-13 DIAGNOSIS — R269 Unspecified abnormalities of gait and mobility: Secondary | ICD-10-CM | POA: Diagnosis not present

## 2017-10-13 DIAGNOSIS — I129 Hypertensive chronic kidney disease with stage 1 through stage 4 chronic kidney disease, or unspecified chronic kidney disease: Secondary | ICD-10-CM | POA: Diagnosis not present

## 2017-10-13 DIAGNOSIS — R600 Localized edema: Secondary | ICD-10-CM | POA: Diagnosis not present

## 2017-10-13 DIAGNOSIS — I350 Nonrheumatic aortic (valve) stenosis: Secondary | ICD-10-CM | POA: Diagnosis not present

## 2017-10-16 ENCOUNTER — Encounter (HOSPITAL_COMMUNITY): Payer: Self-pay | Admitting: Obstetrics and Gynecology

## 2017-10-16 ENCOUNTER — Other Ambulatory Visit: Payer: Self-pay

## 2017-10-16 ENCOUNTER — Emergency Department (HOSPITAL_COMMUNITY): Payer: Medicare Other

## 2017-10-16 ENCOUNTER — Inpatient Hospital Stay (HOSPITAL_COMMUNITY)
Admission: EM | Admit: 2017-10-16 | Discharge: 2017-10-20 | DRG: 291 | Disposition: A | Discharge status: Skilled Nursing Facility | Payer: Medicare Other | Attending: Internal Medicine | Admitting: Internal Medicine

## 2017-10-16 DIAGNOSIS — R600 Localized edema: Secondary | ICD-10-CM

## 2017-10-16 DIAGNOSIS — D72829 Elevated white blood cell count, unspecified: Secondary | ICD-10-CM | POA: Diagnosis present

## 2017-10-16 DIAGNOSIS — R008 Other abnormalities of heart beat: Secondary | ICD-10-CM | POA: Diagnosis present

## 2017-10-16 DIAGNOSIS — R269 Unspecified abnormalities of gait and mobility: Secondary | ICD-10-CM | POA: Diagnosis present

## 2017-10-16 DIAGNOSIS — G309 Alzheimer's disease, unspecified: Secondary | ICD-10-CM | POA: Diagnosis present

## 2017-10-16 DIAGNOSIS — R7989 Other specified abnormal findings of blood chemistry: Secondary | ICD-10-CM | POA: Diagnosis present

## 2017-10-16 DIAGNOSIS — M8580 Other specified disorders of bone density and structure, unspecified site: Secondary | ICD-10-CM | POA: Diagnosis present

## 2017-10-16 DIAGNOSIS — I10 Essential (primary) hypertension: Secondary | ICD-10-CM | POA: Diagnosis not present

## 2017-10-16 DIAGNOSIS — Z96653 Presence of artificial knee joint, bilateral: Secondary | ICD-10-CM | POA: Diagnosis present

## 2017-10-16 DIAGNOSIS — I8390 Asymptomatic varicose veins of unspecified lower extremity: Secondary | ICD-10-CM | POA: Diagnosis present

## 2017-10-16 DIAGNOSIS — F0390 Unspecified dementia without behavioral disturbance: Secondary | ICD-10-CM | POA: Diagnosis not present

## 2017-10-16 DIAGNOSIS — I130 Hypertensive heart and chronic kidney disease with heart failure and stage 1 through stage 4 chronic kidney disease, or unspecified chronic kidney disease: Principal | ICD-10-CM | POA: Diagnosis present

## 2017-10-16 DIAGNOSIS — E876 Hypokalemia: Secondary | ICD-10-CM | POA: Diagnosis present

## 2017-10-16 DIAGNOSIS — Z888 Allergy status to other drugs, medicaments and biological substances status: Secondary | ICD-10-CM

## 2017-10-16 DIAGNOSIS — I509 Heart failure, unspecified: Secondary | ICD-10-CM

## 2017-10-16 DIAGNOSIS — I350 Nonrheumatic aortic (valve) stenosis: Secondary | ICD-10-CM

## 2017-10-16 DIAGNOSIS — I491 Atrial premature depolarization: Secondary | ICD-10-CM | POA: Diagnosis present

## 2017-10-16 DIAGNOSIS — D631 Anemia in chronic kidney disease: Secondary | ICD-10-CM | POA: Diagnosis present

## 2017-10-16 DIAGNOSIS — I428 Other cardiomyopathies: Secondary | ICD-10-CM | POA: Diagnosis not present

## 2017-10-16 DIAGNOSIS — I5023 Acute on chronic systolic (congestive) heart failure: Secondary | ICD-10-CM | POA: Diagnosis present

## 2017-10-16 DIAGNOSIS — E669 Obesity, unspecified: Secondary | ICD-10-CM | POA: Diagnosis present

## 2017-10-16 DIAGNOSIS — M1990 Unspecified osteoarthritis, unspecified site: Secondary | ICD-10-CM | POA: Diagnosis present

## 2017-10-16 DIAGNOSIS — N183 Chronic kidney disease, stage 3 (moderate): Secondary | ICD-10-CM | POA: Diagnosis present

## 2017-10-16 DIAGNOSIS — G9341 Metabolic encephalopathy: Secondary | ICD-10-CM | POA: Diagnosis present

## 2017-10-16 DIAGNOSIS — Z66 Do not resuscitate: Secondary | ICD-10-CM | POA: Diagnosis present

## 2017-10-16 DIAGNOSIS — R0602 Shortness of breath: Secondary | ICD-10-CM

## 2017-10-16 DIAGNOSIS — Z96611 Presence of right artificial shoulder joint: Secondary | ICD-10-CM | POA: Diagnosis present

## 2017-10-16 DIAGNOSIS — F0280 Dementia in other diseases classified elsewhere without behavioral disturbance: Secondary | ICD-10-CM | POA: Diagnosis present

## 2017-10-16 DIAGNOSIS — M6281 Muscle weakness (generalized): Secondary | ICD-10-CM | POA: Diagnosis not present

## 2017-10-16 DIAGNOSIS — R2681 Unsteadiness on feet: Secondary | ICD-10-CM | POA: Diagnosis not present

## 2017-10-16 DIAGNOSIS — Z8619 Personal history of other infectious and parasitic diseases: Secondary | ICD-10-CM

## 2017-10-16 DIAGNOSIS — J810 Acute pulmonary edema: Secondary | ICD-10-CM | POA: Diagnosis present

## 2017-10-16 DIAGNOSIS — I872 Venous insufficiency (chronic) (peripheral): Secondary | ICD-10-CM | POA: Diagnosis present

## 2017-10-16 DIAGNOSIS — N179 Acute kidney failure, unspecified: Secondary | ICD-10-CM | POA: Diagnosis present

## 2017-10-16 DIAGNOSIS — R410 Disorientation, unspecified: Secondary | ICD-10-CM | POA: Diagnosis not present

## 2017-10-16 DIAGNOSIS — Z862 Personal history of diseases of the blood and blood-forming organs and certain disorders involving the immune mechanism: Secondary | ICD-10-CM

## 2017-10-16 DIAGNOSIS — Z6835 Body mass index (BMI) 35.0-35.9, adult: Secondary | ICD-10-CM

## 2017-10-16 DIAGNOSIS — R278 Other lack of coordination: Secondary | ICD-10-CM | POA: Diagnosis not present

## 2017-10-16 DIAGNOSIS — M2550 Pain in unspecified joint: Secondary | ICD-10-CM | POA: Diagnosis not present

## 2017-10-16 DIAGNOSIS — R402413 Glasgow coma scale score 13-15, at hospital admission: Secondary | ICD-10-CM | POA: Diagnosis present

## 2017-10-16 DIAGNOSIS — Z7401 Bed confinement status: Secondary | ICD-10-CM | POA: Diagnosis not present

## 2017-10-16 DIAGNOSIS — R1312 Dysphagia, oropharyngeal phase: Secondary | ICD-10-CM | POA: Diagnosis not present

## 2017-10-16 DIAGNOSIS — I444 Left anterior fascicular block: Secondary | ICD-10-CM | POA: Diagnosis present

## 2017-10-16 DIAGNOSIS — I5033 Acute on chronic diastolic (congestive) heart failure: Secondary | ICD-10-CM | POA: Diagnosis not present

## 2017-10-16 DIAGNOSIS — R488 Other symbolic dysfunctions: Secondary | ICD-10-CM | POA: Diagnosis not present

## 2017-10-16 DIAGNOSIS — Z7982 Long term (current) use of aspirin: Secondary | ICD-10-CM

## 2017-10-16 HISTORY — DX: Alzheimer's disease, unspecified: G30.9

## 2017-10-16 HISTORY — DX: Dementia in other diseases classified elsewhere, unspecified severity, without behavioral disturbance, psychotic disturbance, mood disturbance, and anxiety: F02.80

## 2017-10-16 LAB — CBC WITH DIFFERENTIAL/PLATELET
Basophils Absolute: 0 10*3/uL (ref 0.0–0.1)
Basophils Relative: 0 %
EOS ABS: 0.2 10*3/uL (ref 0.0–0.7)
EOS PCT: 1 %
HCT: 44.9 % (ref 36.0–46.0)
Hemoglobin: 15.4 g/dL — ABNORMAL HIGH (ref 12.0–15.0)
LYMPHS ABS: 0.8 10*3/uL (ref 0.7–4.0)
LYMPHS PCT: 6 %
MCH: 27.4 pg (ref 26.0–34.0)
MCHC: 34.3 g/dL (ref 30.0–36.0)
MCV: 79.9 fL (ref 78.0–100.0)
MONO ABS: 1.9 10*3/uL — AB (ref 0.1–1.0)
MONOS PCT: 13 %
Neutro Abs: 11.8 10*3/uL — ABNORMAL HIGH (ref 1.7–7.7)
Neutrophils Relative %: 80 %
PLATELETS: 289 10*3/uL (ref 150–400)
RBC: 5.62 MIL/uL — ABNORMAL HIGH (ref 3.87–5.11)
RDW: 17.6 % — AB (ref 11.5–15.5)
WBC: 14.7 10*3/uL — AB (ref 4.0–10.5)

## 2017-10-16 LAB — COMPREHENSIVE METABOLIC PANEL
ALT: 17 U/L (ref 14–54)
ANION GAP: 11 (ref 5–15)
AST: 21 U/L (ref 15–41)
Albumin: 3.7 g/dL (ref 3.5–5.0)
Alkaline Phosphatase: 78 U/L (ref 38–126)
BUN: 39 mg/dL — ABNORMAL HIGH (ref 6–20)
CHLORIDE: 105 mmol/L (ref 101–111)
CO2: 23 mmol/L (ref 22–32)
CREATININE: 1.38 mg/dL — AB (ref 0.44–1.00)
Calcium: 9.1 mg/dL (ref 8.9–10.3)
GFR calc non Af Amer: 34 mL/min — ABNORMAL LOW (ref >60)
GFR, EST AFRICAN AMERICAN: 39 mL/min — AB (ref >60)
Glucose, Bld: 140 mg/dL — ABNORMAL HIGH (ref 65–99)
POTASSIUM: 3.2 mmol/L — AB (ref 3.5–5.1)
SODIUM: 139 mmol/L (ref 135–145)
Total Bilirubin: 2.2 mg/dL — ABNORMAL HIGH (ref 0.3–1.2)
Total Protein: 6.5 g/dL (ref 6.5–8.1)

## 2017-10-16 LAB — MAGNESIUM: MAGNESIUM: 2.3 mg/dL (ref 1.7–2.4)

## 2017-10-16 LAB — BRAIN NATRIURETIC PEPTIDE: B NATRIURETIC PEPTIDE 5: 2222.6 pg/mL — AB (ref 0.0–100.0)

## 2017-10-16 LAB — TROPONIN I: Troponin I: 0.17 ng/mL (ref <0.03)

## 2017-10-16 MED ORDER — ONDANSETRON HCL 4 MG PO TABS: 4.0000 mg | ORAL_TABLET | Freq: Four times a day (QID) | ORAL | Status: DC | PRN

## 2017-10-16 MED ORDER — FUROSEMIDE 10 MG/ML IJ SOLN
60.0000 mg | Freq: Two times a day (BID) | INTRAMUSCULAR | Status: DC
  Administered 2017-10-17 – 2017-10-18 (×3): 60 mg via INTRAVENOUS
  Filled 2017-10-16 (×3): qty 6

## 2017-10-16 MED ORDER — TRAZODONE HCL 50 MG PO TABS
50.0000 mg | ORAL_TABLET | Freq: Every evening | ORAL | Status: DC | PRN
  Administered 2017-10-16 – 2017-10-19 (×4): 50 mg via ORAL
  Filled 2017-10-16 (×4): qty 1

## 2017-10-16 MED ORDER — HEPARIN SODIUM (PORCINE) 5000 UNIT/ML IJ SOLN
5000.0000 [IU] | Freq: Three times a day (TID) | INTRAMUSCULAR | Status: DC
Start: 2017-10-16 — End: 2017-10-21
  Administered 2017-10-16 – 2017-10-20 (×12): 5000 [IU] via SUBCUTANEOUS
  Filled 2017-10-16 (×12): qty 1

## 2017-10-16 MED ORDER — ACETAMINOPHEN 650 MG RE SUPP: 650.0000 mg | Freq: Four times a day (QID) | RECTAL | Status: DC | PRN

## 2017-10-16 MED ORDER — FUROSEMIDE 10 MG/ML IJ SOLN
80.0000 mg | Freq: Once | INTRAMUSCULAR | Status: AC
  Administered 2017-10-16: 80 mg via INTRAVENOUS
  Filled 2017-10-16: qty 8

## 2017-10-16 MED ORDER — MEMANTINE HCL ER 28 MG PO CP24
28.0000 mg | ORAL_CAPSULE | Freq: Every day | ORAL | Status: DC
  Administered 2017-10-16 – 2017-10-20 (×5): 28 mg via ORAL
  Filled 2017-10-16 (×5): qty 1

## 2017-10-16 MED ORDER — ACETAMINOPHEN 325 MG PO TABS
650.0000 mg | ORAL_TABLET | Freq: Four times a day (QID) | ORAL | Status: DC | PRN
  Administered 2017-10-16 – 2017-10-18 (×2): 650 mg via ORAL
  Filled 2017-10-16 (×2): qty 2

## 2017-10-16 MED ORDER — POTASSIUM CHLORIDE CRYS ER 20 MEQ PO TBCR
40.0000 meq | EXTENDED_RELEASE_TABLET | Freq: Once | ORAL | Status: AC
  Administered 2017-10-16: 40 meq via ORAL
  Filled 2017-10-16: qty 2

## 2017-10-16 MED ORDER — ASPIRIN 325 MG PO TABS
325.0000 mg | ORAL_TABLET | Freq: Once | ORAL | Status: AC
  Administered 2017-10-16: 325 mg via ORAL
  Filled 2017-10-16: qty 1

## 2017-10-16 MED ORDER — SODIUM CHLORIDE 0.9 % IV SOLN: INTRAVENOUS | Status: DC

## 2017-10-16 MED ORDER — DONEPEZIL HCL 10 MG PO TABS
10.0000 mg | ORAL_TABLET | Freq: Every day | ORAL | Status: DC
  Administered 2017-10-16 – 2017-10-20 (×5): 10 mg via ORAL
  Filled 2017-10-16 (×5): qty 1

## 2017-10-16 MED ORDER — MEMANTINE HCL-DONEPEZIL HCL ER 28-10 MG PO CP24: 1.0000 | ORAL_CAPSULE | Freq: Every evening | ORAL | Status: DC

## 2017-10-16 MED ORDER — ONDANSETRON HCL 4 MG/2ML IJ SOLN: 4.0000 mg | Freq: Four times a day (QID) | INTRAMUSCULAR | Status: DC | PRN

## 2017-10-16 NOTE — ED Notes (Signed)
CRITICAL VALUE STICKER  CRITICAL VALUE: Troponin = 0.17   DATE & TIME NOTIFIED: 10/16/17 @2027    MD NOTIFIED: MD Freida Busman  TIME OF NOTIFICATION:10/16/17 @2027   RESPONSE:

## 2017-10-16 NOTE — ED Provider Notes (Signed)
[facility]-EMERGENCY DEPT Provider Note   CSN: [id number] Arrival date & time: 10/16/17  1817     History   Chief Complaint Chief Complaint  Patient presents with  . Leg Swelling    HPI [name] is a 84 y.o. female.  84 year old female presents with worsening chronic bilateral lower extremity edema.  Has been states that she is now had increasing clear fluid from her legs with associated dyspnea as well as nonproductive cough.  No fever or chills.  No anginal type chest pain.  Does have a known history of venous insufficiency as well as severe aortic stenosis as well as CHF.  She currently takes Zaroxolyn daily and recently had her diuretic dose increased     Past Medical History:  Diagnosis Date  . Alzheimer disease   . Anemia   . Aortic stenosis   . Arthritis   . Complication of anesthesia    trouble waking up-denies  . Edema    FEET/LEGS  . Heart murmur   . Hepatitis    as a child" Liver infection", yellow jauncice  . History of kidney stones   . History of nephrolithiasis    as child  . Hypertension    on treatment  . Osteopenia   . Thrombocytopenia, unspecified (HCC)   . Venous insufficiency    only varicose veins    Patient Active Problem List   Diagnosis Date Noted  . Dementia 10/17/2013  . Knee pain, left 10/12/2013  . Unspecified constipation 10/12/2013  . Postoperative anemia due to acute blood loss 10/06/2013  . S/P total knee replacement using cement 10/04/2013  . Thrombocytopenia (HCC) 04/05/2012  . Osteoarthritis of shoulder 06/04/2011  . Hypertension 06/04/2011  . Aortic stenosis, severe 06/04/2011  . Obesity, Class II, BMI 35-39.9, with comorbidity 06/04/2011    Past Surgical History:  Procedure Laterality Date  . ABDOMINAL HYSTERECTOMY     also oohorectomy ?side  . CATARACT EXTRACTION W/PHACO Left 08/26/2017   Procedure: CATARACT EXTRACTION PHACO AND INTRAOCULAR LENS PLACEMENT (IOC);  Surgeon: Lockie Mola, MD;  Location: [facility] ORS;  Service: Ophthalmology;  Laterality: Left;  [place] 00:44.0 AP% 15.7 CDE 6.87 Fluid Pack Lot @ [id number]  . FRACTURE SURGERY     rt wrist , left hand  . JOINT REPLACEMENT  1478,2956   rt and lft  . LUNG LOBECTOMY  2004   rt upper lobe  . REVISION TOTAL KNEE ARTHROPLASTY Left 10/04/2013   DR Cleophas Dunker  . SHOULDER HEMI-ARTHROPLASTY  06/03/2011   Procedure: SHOULDER HEMI-ARTHROPLASTY;  Surgeon: Valeria Batman, MD;  [facility];  Service: Orthopedics;  Laterality: Left;  Left Hemi Shoulder Hemi-Arthroplasty   . TOTAL KNEE ARTHROPLASTY     bilateral  . TOTAL KNEE REVISION Left 10/04/2013   Procedure: TOTAL KNEE REVISION ;  Surgeon: Valeria Batman, MD;  Location: [facility] OR;  Service: Orthopedics;  Laterality: Left;  LEFT TOTAL KNEE REVISION   . TOTAL SHOULDER ARTHROPLASTY  2005   right  . VASCULAR SURGERY  2005   varicose vein      OB History   None      Home Medications    Prior to Admission medications   Medication Sig Start Date End Date Taking? Authorizing Provider  acetaminophen (TYLENOL) 325 MG tablet Take 650 mg by mouth every 6 (six) hours as needed.   Yes [provider]  aspirin EC 81 MG tablet Take 81 mg by mouth every evening.    Yes [provider]  Ferrous Gluconate-C-Folic Acid (IRON-C PO) Take 65 mg by mouth every evening.    Yes [provider]  furosemide (LASIX) 40 MG tablet Take 80 mg by mouth 2 (two) times daily.    Yes [provider]  Misc Natural Products (EQ GLUCOSAMINE CHONDR/MSM/D) TABS Take 1 tablet by mouth daily.   Yes [provider]  Multiple Vitamins-Minerals (PRESERVISION AREDS 2 PO) Take 1 tablet by mouth daily.   Yes [provider]  NAMZARIC 28-10 MG CP24 Take 1 capsule by mouth every evening.  07/09/15  Yes [provider]  Omega-3 Krill Oil 500 MG CAPS Take 500 mg by mouth daily.   Yes [provider]  metolazone (ZAROXOLYN) 2.5 MG tablet  Take 1 tablet (2.5 mg) by mouth on Monday and Friday only 01/15/17   Leone Brand, NP    Family History Family History  Problem Relation Age of Onset  . Diabetes Brother   . Anesthesia problems Neg Hx   . Hypotension Neg Hx   . Malignant hyperthermia Neg Hx   . Pseudochol deficiency Neg Hx     Social History Social History   Tobacco Use  . Smoking status: Never Smoker  . Smokeless tobacco: Never Used  Substance Use Topics  . Alcohol use: Yes    Alcohol/week: 0.6 oz    Types: 1 drink(s) per week  . Drug use: No     Allergies   Lisinopril and Micardis [telmisartan]   Review of Systems Review of Systems  All other systems reviewed and are negative.    Physical Exam Updated Vital Signs BP 126/86 (BP Location: Left Arm)   Pulse 98   Temp 98.1 F (36.7 C) (Oral)   Resp 18   Ht 1.638 m (5' 4.5")   Wt 98.9 kg (218 lb)   LMP  (LMP Unknown)   SpO2 92%   BMI 36.84 kg/m   Physical Exam  Constitutional: She is oriented to person, place, and time. She appears well-developed and well-nourished.  Non-toxic appearance. No distress.  HENT:  Head: Normocephalic and atraumatic.  Eyes: Pupils are equal, round, and reactive to light. Conjunctivae, EOM and lids are normal.  Neck: Normal range of motion. Neck supple. No tracheal deviation present. No thyroid mass present.  Cardiovascular: Normal rate, regular rhythm and normal heart sounds. Exam reveals no gallop.  No murmur heard. Pulmonary/Chest: Effort normal. No stridor. No respiratory distress. She has decreased breath sounds in the right lower field and the left lower field. She has no wheezes. She has no rhonchi. She has no rales.  Abdominal: Soft. Normal appearance and bowel sounds are normal. She exhibits no distension. There is no tenderness. There is no rebound and no CVA tenderness.  Musculoskeletal: Normal range of motion. She exhibits no edema or tenderness.  Lymphadenopathy:  3+ bilateral lower extremity  pitting edema worse on the left side with associated weeping of clear fluid  Neurological: She is alert and oriented to person, place, and time. No cranial nerve deficit or sensory deficit. GCS eye subscore is 4. GCS verbal subscore is 5. GCS motor subscore is 6.  Skin: Skin is warm and dry. No abrasion and no rash noted.  Psychiatric: She has a normal mood and affect. Her speech is normal and behavior is normal.  Nursing note and vitals reviewed.    ED Treatments / Results  Labs (all labs ordered are listed, but only abnormal results are displayed) Labs Reviewed  CBC WITH DIFFERENTIAL/PLATELET  COMPREHENSIVE METABOLIC PANEL  TROPONIN I  BRAIN NATRIURETIC PEPTIDE    EKG EKG Interpretation  Date/Time:  Friday October 16 2017 19:19:40 EDT Ventricular Rate:  81 PR Interval:    QRS Duration: 130 QT Interval:  394 QTC Calculation: 458 R Axis:   -57 Text Interpretation:  Sinus rhythm Supraventricular bigeminy Nonspecific IVCD with LAD Left ventricular hypertrophy ST elevation, consider anterior injury Confirmed by Lorre Nick ([id number]) on 10/16/2017 8:34:21 PM   Radiology No results found.  Procedures Procedures (including critical care time)  Medications Ordered in ED Medications  0.9 %  sodium chloride infusion (has no administration in time range)     Initial Impression / Assessment and Plan / ED Course  I have reviewed the triage vital signs and the nursing notes.  Pertinent labs & imaging results that were available during my care of the patient were reviewed by me and considered in my medical decision making (see chart for details).     Patient with evidence of CHF on chest x-ray as well as her blood work.  Given Lasix 80 mg IV push and will be admitted to the hospitalist service.  Final Clinical Impressions(s) / ED Diagnoses   Final diagnoses:  SOB (shortness of breath)    ED Discharge Orders    None       Lorre Nick, MD 10/16/17 [phone]

## 2017-10-16 NOTE — ED Notes (Signed)
Pt's husband has a list of lab results with her BNP showing to measure greater than 1000

## 2017-10-16 NOTE — ED Triage Notes (Signed)
Per Pt: Pt reports bilateral leg swelling with weeping. Pt has a hx of CHF and her PCP recently changed her medications to increase her lasix, pt has not started this yet.

## 2017-10-16 NOTE — H&P (Addendum)
History and Physical    [name] FBX:[id number] DOB: 07-05-1930 DOA: 10/16/2017  PCP: Merlene Laughter, MD   Patient coming from: Home   Chief Complaint: SOB, leg swelling  HPI: [name] is a 84 y.o. female with medical history significant for severe aortic stenosis, CHF, HTN, Dementia- who presented to the ED with SOB with exertion of 4 weeks and leg swelling also of 4 weeks duration. No cough,. No chest pain. Spouse is present at bedside. Hx is obtained from both. Patient saw her PCP Dr. Corbin Ade and was prescribed a new medication to help with the swelling- metolazone 5mg  twice a week, which they picked up today and so have not had the chance to take it. Patients left leg developed blisters yesterday and was wet today hence presentation to the ED. Patient reports daily compliance with her lasix, dose has being increased gradually over several months and she has being taken 80 BID for at least 2 months. She is not complaint with a low salt diet.  No recent travels. No redness, warmth or pain in legs.  Spouse reports baseline weight ~213Lbs.  ED Course: Tachycardia to 114, respiratory rate 18-26, blood pressure 108- 126 systolic, O2 sats greater than 92% on room air.  WBC elevated 14.7, hemoglobin also elevated 15.4, creatinine elevated 1.38, from 0.6- 2015.  EKG supraventricular bigeminy.  Troponin elevated 0.17.  BNP elevated 2222.  Chest x-ray showed mild central pulmonary vascular congestion, minimal bilateral pleural effusions. Patient received IV Lasix 80 mg in ED x1.  Hospitalist was called to admit for congestive heart failure.  Review of Systems: As per HPI otherwise 10 point review of systems negative.   Past Medical History:  Diagnosis Date  . Alzheimer disease   . Anemia   . Aortic stenosis   . Arthritis   . Complication of anesthesia    trouble waking up-denies  . Edema    FEET/LEGS  . Heart murmur   . Hepatitis    as a child" Liver infection", yellow  jauncice  . History of kidney stones   . History of nephrolithiasis    as child  . Hypertension    on treatment  . Osteopenia   . Thrombocytopenia, unspecified (HCC)   . Venous insufficiency    only varicose veins    Past Surgical History:  Procedure Laterality Date  . ABDOMINAL HYSTERECTOMY     also oohorectomy ?side  . CATARACT EXTRACTION W/PHACO Left 08/26/2017   Procedure: CATARACT EXTRACTION PHACO AND INTRAOCULAR LENS PLACEMENT (IOC);  Surgeon: Lockie Mola, MD;  Location: [facility] ORS;  Service: Ophthalmology;  Laterality: Left;  [place] 00:44.0 AP% 15.7 CDE 6.87 Fluid Pack Lot @ [id number] H  . FRACTURE SURGERY     rt wrist , left hand  . JOINT REPLACEMENT  6004,5997   rt and lft  . LUNG LOBECTOMY  2004   rt upper lobe  . REVISION TOTAL KNEE ARTHROPLASTY Left 10/04/2013   DR Cleophas Dunker  . SHOULDER HEMI-ARTHROPLASTY  06/03/2011   Procedure: SHOULDER HEMI-ARTHROPLASTY;  Surgeon: Valeria Batman, MD;  Location: [facility] OR;  Service: Orthopedics;  Laterality: Left;  Left Hemi Shoulder Hemi-Arthroplasty   . TOTAL KNEE ARTHROPLASTY     bilateral  . TOTAL KNEE REVISION Left 10/04/2013   Procedure: TOTAL KNEE REVISION ;  Surgeon: Valeria Batman, MD;  Location: [facility] OR;  Service: Orthopedics;  Laterality: Left;  LEFT TOTAL KNEE REVISION   . TOTAL SHOULDER ARTHROPLASTY  2005   right  . VASCULAR SURGERY  2005   varicose vein      reports that she has never smoked. She has never used smokeless tobacco. She reports that she drinks about 0.6 oz of alcohol per week. She reports that she does not use drugs.  Allergies  Allergen Reactions  . Lisinopril Cough  . Micardis [Telmisartan] Other (See Comments)    dizziness    Family History  Problem Relation Age of Onset  . Diabetes Brother   . Anesthesia problems Neg Hx   . Hypotension Neg Hx   . Malignant hyperthermia Neg Hx   . Pseudochol deficiency Neg Hx     Prior to Admission medications   Medication Sig Start Date End Date  Taking? Authorizing Provider  acetaminophen (TYLENOL) 325 MG tablet Take 650 mg by mouth every 6 (six) hours as needed.   Yes [provider]  aspirin EC 81 MG tablet Take 81 mg by mouth every evening.    Yes [provider]  Ferrous Gluconate-C-Folic Acid (IRON-C PO) Take 65 mg by mouth every evening.    Yes [provider]  furosemide (LASIX) 40 MG tablet Take 80 mg by mouth 2 (two) times daily.    Yes [provider]  Misc Natural Products (EQ GLUCOSAMINE CHONDR/MSM/D) TABS Take 1 tablet by mouth daily.   Yes [provider]  Multiple Vitamins-Minerals (PRESERVISION AREDS 2 PO) Take 1 tablet by mouth daily.   Yes [provider]  NAMZARIC 28-10 MG CP24 Take 1 capsule by mouth every evening.  07/09/15  Yes [provider]  Omega-3 Krill Oil 500 MG CAPS Take 500 mg by mouth daily.   Yes [provider]  metolazone (ZAROXOLYN) 2.5 MG tablet Take 1 tablet (2.5 mg) by mouth on Monday and Friday only 01/15/17   Leone Brand, NP    Physical Exam: Vitals:   10/16/17 1829 10/16/17 1830 10/16/17 1930 10/16/17 2000  BP: 126/86  108/75 113/66  Pulse: 98  (!) 113 (!) 114  Resp: 18  (!) 24 (!) 26  Temp: 98.1 F (36.7 C)     TempSrc: Oral     SpO2: 92%  96% 94%  Weight:  98.9 kg (218 lb)    Height:  5' 4.5" (1.638 m)      Constitutional: NAD, calm, comfortable Vitals:   10/16/17 1829 10/16/17 1830 10/16/17 1930 10/16/17 2000  BP: 126/86  108/75 113/66  Pulse: 98  (!) 113 (!) 114  Resp: 18  (!) 24 (!) 26  Temp: 98.1 F (36.7 C)     TempSrc: Oral     SpO2: 92%  96% 94%  Weight:  98.9 kg (218 lb)    Height:  5' 4.5" (1.638 m)     Eyes: PERRL, lids and conjunctivae normal ENMT: Mucous membranes are moist. Posterior pharynx clear of any exudate or lesions. Neck: normal, supple, no masses, no thyromegaly Respiratory: Right lower lung crackles. On room air. Normal respiratory effort. No accessory muscle use.    Cardiovascular: Regular rate and rhythm, 3/6 systolic murmurs loudest right sternal border / rubs / gallops. 2+ piting bilat lower extremity edema to knees.  Abdomen: no tenderness, no masses palpated. No hepatosplenomegaly. Bowel sounds positive.  Musculoskeletal: no clubbing / cyanosis. No joint deformity upper and lower extremities. Good ROM, no contractures. Normal muscle tone.  Skin: Mild chronic Stasis changes bilateral lower extremity. no induration Neurologic: CN 2-12 grossly intact. Strength 5/5 in all 4.  Psychiatric: Normal judgment and insight. Alert and oriented x  3. Normal mood.   Labs on Admission: I have personally reviewed following labs and imaging studies  CBC: Recent Labs  Lab 10/16/17 1901  WBC 14.7*  NEUTROABS 11.8*  HGB 15.4*  HCT 44.9  MCV 79.9  PLT 289   Basic Metabolic Panel: Recent Labs  Lab 10/16/17 1901  NA 139  K 3.2*  CL 105  CO2 23  GLUCOSE 140*  BUN 39*  CREATININE 1.38*  CALCIUM 9.1   Liver Function Tests: Recent Labs  Lab 10/16/17 1901  AST 21  ALT 17  ALKPHOS 78  BILITOT 2.2*  PROT 6.5  ALBUMIN 3.7    Recent Labs  Lab 10/16/17 1901  TROPONINI 0.17*    Radiological Exams on Admission: Dg Chest 2 View  Result Date: 10/16/2017 CLINICAL DATA:  Shortness of breath. EXAM: CHEST - 2 VIEW COMPARISON:  Radiographs of September 26, 2013. FINDINGS: Stable cardiomegaly with central pulmonary vascular congestion is noted. No pneumothorax is noted. Small bilateral pleural effusions are noted. No consolidative process is noted. Status post bilateral shoulder arthroplasties. IMPRESSION: Stable cardiomegaly with mild central pulmonary vascular congestion. Minimal bilateral pleural effusions are noted. Electronically Signed   By: Lupita Raider, M.D.   On: 10/16/2017 20:02    EKG: Independently reviewed.  Supraventricular bigeminy, QRS 130, QTc 458.   Assessment/Plan Principal Problem:   Acute exacerbation of CHF (congestive heart failure)  (HCC) Active Problems:   Hypertension   Aortic stenosis, severe   Dementia   Acute CHF exacerbation- SOB with exertion, bilateral lower extremity swelling.  On Room air. BNP- 2222 in the setting of renal insufficiency- no prior to compare. 2 view chest x-ray-mild central vascular congestion, minimal bilateral pleural effusion.  Lasix 80 twice daily compliant, not new med metolazone 2.5 2ce/weekly, also not compliant with low-salt diet. Baseline weight per spouse ~213Lbs.  Last echo- 2017-EF 55 to 60%, G1DD.  Trop elevated- 0.12.  No chest pain.  IV Lasix 80 given in ED.  -Updated Echocardiogram -Tropes x2 -Continue Lasix 60 twice daily -Strict input output, Daily weights - BMP a.m -Continue home aspirin  Elevated troponin- 0.12, in the setting of renal insufficiency. No chest pain.  EKG-supraventricular bigeminy.  -Trend troponins, ECho - aspirin 325mg  X 1  ?Acute kidney injury- Cr- 1.38. Last Cr on file- 2015- Cr- 0.68.  Appears volume overloaded. -Diurese -Hold home metolazone for now  Hypokalemia- 3.2.  Likely from diuretics. -Check magnesium -Replete -BMP a.m.  Leukocytosis- 14.7.  Patient refusing to answer if she has dysuria.  Spouse denies dysuria. Chest Xray- vascular congestion. Afebrile. - CBC a.m  Severe aortic stenosis-follows with Dr. Anne Fu.  Not a candidate for TAVR  Per notes. -Echocardiogram  Dementia-per spouse, mild, sometimes problem with short-term memory.  Ambulatory with cane/walker -Continue home Namzaric  HTN-blood pressure systolic 108-126. -Diuresis  DVT prophylaxis: Heparin Code Status: Full Family Communication: Spouse at bedside Disposition Plan: per rounding team Consults called: None Admission status: Inpatient, tele   Onnie Boer MD [facility] Pager [phone] From 6PM-2AM.  Otherwise please contact night-coverage [email or web address] Password [facility]  10/16/2017, 8:57 PM

## 2017-10-16 NOTE — ED Notes (Addendum)
Troponin 0.17 critical result called from lab. RN Charolett Bumpers made aware.

## 2017-10-16 NOTE — ED Notes (Signed)
Hospitalist at bedside 

## 2017-10-16 NOTE — ED Notes (Signed)
ED TO INPATIENT HANDOFF REPORT  Name/Age/Gender [name] 84 y.o. female  Code Status Code Status History    Date Active Date Inactive Code Status Order ID Comments User Context   10/04/2013 1529 10/07/2013 2136 Full Code [id number]  Garald Balding, MD Inpatient      Home/SNF/Other Home  Chief Complaint bilateral leg swelling and pain  Level of Care/Admitting Diagnosis ED Disposition    ED Disposition Condition [facility] Area: [facility] [[id number]]  Level of Care: Telemetry [5]  Admit to tele based on following criteria: Acute CHF  Diagnosis: Acute exacerbation of CHF (congestive heart failure) [facility]) [[id number]]  Admitting Physician: Kara Pacer  Attending Physician: Bethena Roys Nessa.Cuff  Estimated length of stay: past midnight tomorrow  Certification:: I certify this patient will need inpatient services for at least 2 midnights  PT Class (Do Not Modify): Inpatient [101]  PT Acc Code (Do Not Modify): Private [1]       Medical History Past Medical History:  Diagnosis Date  . Alzheimer disease   . Anemia   . Aortic stenosis   . Arthritis   . Complication of anesthesia    trouble waking up-denies  . Edema    FEET/LEGS  . Heart murmur   . Hepatitis    as a child" Liver infection", yellow jauncice  . History of kidney stones   . History of nephrolithiasis    as child  . Hypertension    on treatment  . Osteopenia   . Thrombocytopenia, unspecified ([place])   . Venous insufficiency    only varicose veins    Allergies Allergies  Allergen Reactions  . Lisinopril Cough  . Micardis [Telmisartan] Other (See Comments)    dizziness    IV Location/Drains/Wounds Patient Lines/Drains/Airways Status   Active Line/Drains/Airways    Name:   Placement date:   Placement time:   Site:   Days:   Peripheral IV 10/16/17 Left Hand   10/16/17    1937    Hand   less than 1   Incision 06/03/11 Shoulder Left    06/03/11    1424     2327   Incision (Closed) 10/04/13 Leg Left   10/04/13    1210     1473   Incision (Closed) 08/26/17 Eye Left   08/26/17    0748     51          Labs/Imaging Results for orders placed or performed during the hospital encounter of 10/16/17 (from the past 48 hour(s))  CBC with Differential/Platelet     Status: Abnormal   Collection Time: 10/16/17  7:01 PM  Result Value Ref Range   WBC 14.7 (H) 4.0 - 10.5 K/uL   RBC 5.62 (H) 3.87 - 5.11 MIL/uL   Hemoglobin 15.4 (H) 12.0 - 15.0 g/dL   HCT 44.9 36.0 - 46.0 %   MCV 79.9 78.0 - 100.0 fL   MCH 27.4 26.0 - 34.0 pg   MCHC 34.3 30.0 - 36.0 g/dL   RDW 17.6 (H) 11.5 - 15.5 %   Platelets 289 150 - 400 K/uL   Neutrophils Relative % 80 %   Neutro Abs 11.8 (H) 1.7 - 7.7 K/uL   Lymphocytes Relative 6 %   Lymphs Abs 0.8 0.7 - 4.0 K/uL   Monocytes Relative 13 %   Monocytes Absolute 1.9 (H) 0.1 - 1.0 K/uL   Eosinophils Relative 1 %   Eosinophils Absolute 0.2 0.0 -  0.7 K/uL   Basophils Relative 0 %   Basophils Absolute 0.0 0.0 - 0.1 K/uL    Comment: Performed at [facility], [place] [address], [place], [place] [address]  Comprehensive metabolic panel     Status: Abnormal   Collection Time: 10/16/17  7:01 PM  Result Value Ref Range   Sodium 139 135 - 145 mmol/L   Potassium 3.2 (L) 3.5 - 5.1 mmol/L   Chloride 105 101 - 111 mmol/L   CO2 23 22 - 32 mmol/L   Glucose, Bld 140 (H) 65 - 99 mg/dL   BUN 39 (H) 6 - 20 mg/dL   Creatinine, Ser 1.38 (H) 0.44 - 1.00 mg/dL   Calcium 9.1 8.9 - 10.3 mg/dL   Total Protein 6.5 6.5 - 8.1 g/dL   Albumin 3.7 3.5 - 5.0 g/dL   AST 21 15 - 41 U/L   ALT 17 14 - 54 U/L   Alkaline Phosphatase 78 38 - 126 U/L   Total Bilirubin 2.2 (H) 0.3 - 1.2 mg/dL   GFR calc non Af Amer 34 (L) >60 mL/min   GFR calc Af Amer 39 (L) >60 mL/min    Comment: (NOTE) The eGFR has been calculated using the CKD EPI equation. This calculation has not been validated in all clinical situations. eGFR's  persistently <60 mL/min signify possible Chronic Kidney Disease.    Anion gap 11 5 - 15    Comment: Performed at [facility], [place] [address] [place], [place] [address]  Troponin I     Status: Abnormal   Collection Time: 10/16/17  7:01 PM  Result Value Ref Range   Troponin I 0.17 (HH) <0.03 ng/mL    Comment: CRITICAL RESULT CALLED TO, READ BACK BY AND VERIFIED WITH: Edyth Gunnels [id number] @ 2023 BY J SCOTTON Performed at [place] [address], [place], [place] [address]   Brain natriuretic peptide     Status: Abnormal   Collection Time: 10/16/17  7:01 PM  Result Value Ref Range   B Natriuretic Peptide 2,222.6 (H) 0.0 - 100.0 pg/mL    Comment: Performed at [facility], [place] [address] [place], [place] [address]   Dg Chest 2 View  Result Date: 10/16/2017 CLINICAL DATA:  Shortness of breath. EXAM: CHEST - 2 VIEW COMPARISON:  Radiographs of September 26, 2013. FINDINGS: Stable cardiomegaly with central pulmonary vascular congestion is noted. No pneumothorax is noted. Small bilateral pleural effusions are noted. No consolidative process is noted. Status post bilateral shoulder arthroplasties. IMPRESSION: Stable cardiomegaly with mild central pulmonary vascular congestion. Minimal bilateral pleural effusions are noted. Electronically Signed   By: Marijo Conception, M.D.   On: 10/16/2017 20:02    Pending Labs Unresulted Labs (From admission, onward)   Start     Ordered   10/17/17 0100  Troponin I (q 6hr x 3)  Now then every 6 hours,   R     10/16/17 2057   Signed and Held  Basic metabolic panel  Tomorrow morning,   R     Signed and Held      Vitals/Pain Today's Vitals   10/16/17 1830 10/16/17 1930 10/16/17 1950 10/16/17 2000  BP:  108/75  113/66  Pulse:  (!) 113  (!) 114  Resp:  (!) 24  (!) 26  Temp:      TempSrc:      SpO2:  96%  94%  Weight: 218 lb (98.9 kg)  Height: 5' 4.5" (1.638 m)     PainSc:   0-No pain      Isolation Precautions No active isolations  Medications Medications  0.9 %  sodium chloride infusion ( Intravenous Hold 10/16/17 2032)  furosemide (LASIX) injection 80 mg (80 mg Intravenous Given 10/16/17 2123)    Mobility non-ambulatory

## 2017-10-17 ENCOUNTER — Inpatient Hospital Stay (HOSPITAL_COMMUNITY): Payer: Medicare Other

## 2017-10-17 DIAGNOSIS — R600 Localized edema: Secondary | ICD-10-CM

## 2017-10-17 DIAGNOSIS — F0390 Unspecified dementia without behavioral disturbance: Secondary | ICD-10-CM

## 2017-10-17 DIAGNOSIS — J810 Acute pulmonary edema: Secondary | ICD-10-CM

## 2017-10-17 DIAGNOSIS — I10 Essential (primary) hypertension: Secondary | ICD-10-CM

## 2017-10-17 DIAGNOSIS — I5033 Acute on chronic diastolic (congestive) heart failure: Secondary | ICD-10-CM

## 2017-10-17 LAB — TROPONIN I
TROPONIN I: 0.17 ng/mL — AB (ref <0.03)
TROPONIN I: 0.14 ng/mL — AB (ref <0.03)

## 2017-10-17 LAB — BASIC METABOLIC PANEL
Anion gap: 10 (ref 5–15)
BUN: 40 mg/dL — ABNORMAL HIGH (ref 6–20)
CALCIUM: 9.4 mg/dL (ref 8.9–10.3)
CO2: 25 mmol/L (ref 22–32)
Chloride: 105 mmol/L (ref 101–111)
Creatinine, Ser: 1.35 mg/dL — ABNORMAL HIGH (ref 0.44–1.00)
GFR calc Af Amer: 41 mL/min — ABNORMAL LOW (ref >60)
GFR, EST NON AFRICAN AMERICAN: 35 mL/min — AB (ref >60)
GLUCOSE: 182 mg/dL — AB (ref 65–99)
POTASSIUM: 3.4 mmol/L — AB (ref 3.5–5.1)
SODIUM: 140 mmol/L (ref 135–145)

## 2017-10-17 LAB — ECHOCARDIOGRAM COMPLETE
Height: 64.5 in
WEIGHTICAEL: 3545 [oz_av]

## 2017-10-17 MED ORDER — HALOPERIDOL LACTATE 5 MG/ML IJ SOLN
5.0000 mg | Freq: Once | INTRAMUSCULAR | Status: AC
  Administered 2017-10-17: 5 mg via INTRAVENOUS
  Filled 2017-10-17: qty 1

## 2017-10-17 NOTE — Progress Notes (Signed)
PROGRESS NOTE    [name]  ZOX:[id number] DOB: Jun 23, 1930 DOA: 10/16/2017 PCP: Merlene Laughter, MD    Brief Narrative:  84 year old female who presented with dyspnea and lower extremity edema.  She does have significant past medical history for severe aortic stenosis, congestive heart failure, hypertension and dementia.  Reported 4 weeks of worsening dyspnea, associated lower extremity edema.  Her symptoms have been refractory to outpatient therapy with diuretics.  On her initial physical examination blood pressure 126/86, heart rate 98-114, respiratory 26, oxygen saturation 92%.  His membranes, lungs with rales bilaterally, heart S1-S2 present and rhythmic, 3 out of systolic murmur at the right sternal border, abdomen soft nontender, lower extremities with 2+ bilateral pitting edema.  139, potassium 3.2, chloride 105, bicarb 23, glucose 140, BUN 39, creatinine 1.38, BNP 2222, troponin 0.17, white count 14.7, hemoglobin 15.4, hematocrit 44.9, platelets 289.  Chest x-ray with cardiomegaly, increased vascular congestion, small bilateral pleural effusions.  EKG sinus rhythm, premature atrial complexes, left axis deviation, anterior fascicular block, interventricular conduction delay, poor R wave progression, LVH per EKG criteria.   Patient was admitted to the hospital with the working diagnosis of acute decompensation of diastolic heart failure, complicated with cardiogenic pulmonary edema.    Assessment & Plan:   Principal Problem:   Acute exacerbation of CHF (congestive heart failure) (HCC) Active Problems:   Hypertension   Aortic stenosis, severe   Dementia   1.  Acute decompensated diastolic heart failure. Will continue furosemide IV for diuresis, target a negative fluid balance. Urine output over last 24 hours 400 cc. Continue strict in and out, plus daily weight. At home on metolazone. Last echocardiogram with left ventricle ejection fraction 55 to 60% with severe aortic stenosis.  Will follow echocardiogram on this admission.   2.  Cardiogenic pulmonary edema. Continue diuresis with furosemide, continue oxymetry monitoring and supplemental 0-2 per [place], current oxygen saturation 95% on room air and reported improvement in her dyspnea.   3.  Severe aortic stenosis with valvular heart disease. Has severe aortic stenosis, follows up with outpatient cardiology, she has been deemed not candidate for TaVR. Will continue diuresis, and medical management, follow on echocardiography, suspected worsening LV systolic function.   4. CKD stage 3. Renal function stable with serum cr at 1,35, with K at 3,4 and serum bicarbonate at 25. Will continue aggressive diuresis with high dose IV furosemide, will follow on renal panel in am, continue urine output monitoring.   4.  Hypertension. Patient not on antihypertensive agents, blood pressure systolic 120 mmHg.   5. Dementia. Patient with confusion but no agitation, will continue memantine, donepizil and trazodone.   DVT prophylaxis: heparin  Code Status:  full Family Communication: no family at the bedside  Disposition Plan: home when clinically improved    Consultants:     Procedures:     Antimicrobials:       Subjective: Patient reports improvement in her dyspnea, uses wheelchair for mobilization, no nausea or vomiting, no chest pain. Seems to be confused.   Objective: Vitals:   10/16/17 2130 10/16/17 2231 10/17/17 0510 10/17/17 0511  BP: (!) 118/106 120/77 110/84   Pulse: 89 96 94   Resp: (!) 25 18 18    Temp:  98.9 F (37.2 C) (!) 97.5 F (36.4 C)   TempSrc:  Oral Oral   SpO2: 97% 96% 95%   Weight:    100.5 kg (221 lb 9 oz)  Height:        Intake/Output Summary (Last 24  hours) at 10/17/2017 1029 Last data filed at 10/17/2017 7543 Gross per 24 hour  Intake 560 ml  Output 400 ml  Net 160 ml   Filed Weights   10/16/17 1830 10/17/17 0511  Weight: 98.9 kg (218 lb) 100.5 kg (221 lb 9 oz)    Examination:    General: deconditioned  Neurology: Awake and alert, non focal, seems to be confused   E ENT: mild  pallor, no icterus, oral mucosa moist Cardiovascular: No JVD. S1-S2 present, rhythmic, no gallops, no rubs. 3/6 systolic murmur at the right sternal border, radiated to the carotids. +++ pitting lower extremity edema bilaterally.  Pulmonary: decreased breath sounds bilaterally, poor air movement, no wheezing, rhonchi or rales. Gastrointestinal. Abdomen protuberant with no organomegaly, non tender, no rebound or guarding Skin. Atrophic and discoloration at the distal lower extremities bilaterally.  Musculoskeletal: no joint deformities     Data Reviewed: I have personally reviewed following labs and imaging studies  CBC: Recent Labs  Lab 10/16/17 1901  WBC 14.7*  NEUTROABS 11.8*  HGB 15.4*  HCT 44.9  MCV 79.9  PLT 289   Basic Metabolic Panel: Recent Labs  Lab 10/16/17 1901 10/17/17 0115  NA 139 140  K 3.2* 3.4*  CL 105 105  CO2 23 25  GLUCOSE 140* 182*  BUN 39* 40*  CREATININE 1.38* 1.35*  CALCIUM 9.1 9.4  MG 2.3  --    GFR: Estimated Creatinine Clearance: 36.1 mL/min (A) (by C-G formula based on SCr of 1.35 mg/dL (H)). Liver Function Tests: Recent Labs  Lab 10/16/17 1901  AST 21  ALT 17  ALKPHOS 78  BILITOT 2.2*  PROT 6.5  ALBUMIN 3.7   No results for input(s): LIPASE, AMYLASE in the last 168 hours. No results for input(s): AMMONIA in the last 168 hours. Coagulation Profile: No results for input(s): INR, PROTIME in the last 168 hours. Cardiac Enzymes: Recent Labs  Lab 10/16/17 1901 10/17/17 0115 10/17/17 0724  TROPONINI 0.17* 0.17* 0.14*   BNP (last 3 results) No results for input(s): PROBNP in the last 8760 hours. HbA1C: No results for input(s): HGBA1C in the last 72 hours. CBG: No results for input(s): GLUCAP in the last 168 hours. Lipid Profile: No results for input(s): CHOL, HDL, LDLCALC, TRIG, CHOLHDL, LDLDIRECT in the last 72  hours. Thyroid Function Tests: No results for input(s): TSH, T4TOTAL, FREET4, T3FREE, THYROIDAB in the last 72 hours. Anemia Panel: No results for input(s): VITAMINB12, FOLATE, FERRITIN, TIBC, IRON, RETICCTPCT in the last 72 hours.    Radiology Studies: I have reviewed all of the imaging during this hospital visit personally     Scheduled Meds: . donepezil  10 mg Oral QHS  . furosemide  60 mg Intravenous BID  . heparin  5,000 Units Subcutaneous Q8H  . memantine  28 mg Oral QHS   Continuous Infusions:   LOS: 1 day        Kaitlynd Phillips Annett Gula, MD [facility] Pager [phone]

## 2017-10-17 NOTE — Progress Notes (Signed)
  Echocardiogram 2D Echocardiogram has been performed.  [name] 10/17/2017, 2:43 PM

## 2017-10-18 DIAGNOSIS — I350 Nonrheumatic aortic (valve) stenosis: Secondary | ICD-10-CM

## 2017-10-18 LAB — BASIC METABOLIC PANEL
Anion gap: 11 (ref 5–15)
BUN: 38 mg/dL — AB (ref 6–20)
CALCIUM: 8.9 mg/dL (ref 8.9–10.3)
CO2: 26 mmol/L (ref 22–32)
Chloride: 103 mmol/L (ref 101–111)
Creatinine, Ser: 1.2 mg/dL — ABNORMAL HIGH (ref 0.44–1.00)
GFR calc Af Amer: 47 mL/min — ABNORMAL LOW (ref >60)
GFR, EST NON AFRICAN AMERICAN: 40 mL/min — AB (ref >60)
Glucose, Bld: 134 mg/dL — ABNORMAL HIGH (ref 65–99)
POTASSIUM: 3.3 mmol/L — AB (ref 3.5–5.1)
SODIUM: 140 mmol/L (ref 135–145)

## 2017-10-18 MED ORDER — HALOPERIDOL 1 MG PO TABS
1.0000 mg | ORAL_TABLET | ORAL | Status: DC | PRN
  Filled 2017-10-18: qty 1

## 2017-10-18 MED ORDER — CARVEDILOL 12.5 MG PO TABS
12.5000 mg | ORAL_TABLET | Freq: Two times a day (BID) | ORAL | Status: DC
  Administered 2017-10-18 – 2017-10-20 (×5): 12.5 mg via ORAL
  Filled 2017-10-18 (×5): qty 1

## 2017-10-18 MED ORDER — FUROSEMIDE 10 MG/ML IJ SOLN
40.0000 mg | Freq: Two times a day (BID) | INTRAMUSCULAR | Status: DC
Start: 2017-10-18 — End: 2017-10-19
  Administered 2017-10-18 – 2017-10-19 (×2): 40 mg via INTRAVENOUS
  Filled 2017-10-18 (×2): qty 4

## 2017-10-18 MED ORDER — SPIRONOLACTONE 25 MG PO TABS
25.0000 mg | ORAL_TABLET | Freq: Every day | ORAL | Status: DC
  Administered 2017-10-18 – 2017-10-20 (×3): 25 mg via ORAL
  Filled 2017-10-18 (×3): qty 1

## 2017-10-18 MED ORDER — HYDROCODONE-ACETAMINOPHEN 5-325 MG PO TABS
2.0000 | ORAL_TABLET | Freq: Once | ORAL | Status: AC
  Administered 2017-10-18: 2 via ORAL
  Filled 2017-10-18: qty 2

## 2017-10-18 MED ORDER — POTASSIUM CHLORIDE CRYS ER 20 MEQ PO TBCR
40.0000 meq | EXTENDED_RELEASE_TABLET | Freq: Once | ORAL | Status: AC
  Administered 2017-10-18: 40 meq via ORAL
  Filled 2017-10-18: qty 2

## 2017-10-18 MED ORDER — HALOPERIDOL LACTATE 5 MG/ML IJ SOLN: 1.0000 mg | INTRAMUSCULAR | Status: DC | PRN

## 2017-10-18 MED ORDER — HALOPERIDOL LACTATE 2 MG/ML PO CONC
1.0000 mg | ORAL | Status: DC | PRN
  Administered 2017-10-18 – 2017-10-19 (×3): 1 mg via ORAL
  Filled 2017-10-18 (×4): qty 0.5

## 2017-10-18 NOTE — Evaluation (Signed)
Physical Therapy Evaluation Patient Details Name: [name] MRN: [id number] DOB: [birth year] Today's Date: 10/18/2017   History of Present Illness  84 year old female who presented to ED  dyspnea and lower extremity edema.  Pt has significant past medical history for severe aortic stenosis, congestive heart failure, hypertension and dementia.  Reported 4 weeks of worsening dyspnea, associated lower extremity edema  Clinical Impression  Pt with very limited activity tolerance today and with some difficulty progressing mobility (could be fear, or motor planning, processing difficulties). Pt's husband and family states that jut 3 days ago she was walking around the house with little difficulty, having to take short breaks due to SOB recently though. Our session today with bed mobility , transfer to bed side commode, and back howeer this was very difficult and took a lot of encouraging and assistance for an hour. Very difficulty to touch and assist with B LEs due to pain, and with great dyspnea after this session. Spoke with husband about possibility of recommending a ST-SNF prior to returning home , and at this time he is hoping she will progress to return home.      Follow Up Recommendations SNF(or unless pt progresses )    Equipment Recommendations       Recommendations for Other Services       Precautions / Restrictions Precautions Precautions: None Precaution Comments: except gentle with BLE due to paina dn thin skin, difficult to assist, please don't assist LEs by holding back of calf, ask patient to assist with lifting them.  Restrictions Weight Bearing Restrictions: No      Mobility  Bed Mobility Overal bed mobility: Needs Assistance Bed Mobility: Rolling;Sidelying to Sit;Supine to Sit;Sit to Supine Rolling: +2 for physical assistance;Max assist   Supine to sit: Mod assist;+2 for physical assistance Sit to supine: Max assist;+2 for physical assistance   General bed  mobility comments: Very difficult for her to move her lower extremeities and inhibiting her from moving with all bed mobility. Also difficult to hold assist her LE due ot pain with assisting and touching anywhere on her Lower legs   Transfers Overall transfer level: Needs assistance Equipment used: Rolling walker (2 wheeled);2 person hand held assist Transfers: Sit to/from [employer] Sit to Stand: +2 physical assistance;Mod assist Stand pivot transfers: +2 physical assistance;Mod assist       General transfer comment: max cues and calming due to patient reported a lot of fear. So needed to take her time and be cued on how to move . Difficulty with pivot and moving her feet for pivot transfer to the [facility] and back to bed. trialed several different sit to stand techniques of using the RW and or using B hand held assist to stand the grab hold of the walker.   Ambulation/Gait                Stairs            Wheelchair Mobility    Modified Rankin (Stroke Patients Only)       Balance Overall balance assessment: Needs assistance Sitting-balance support: Bilateral upper extremity supported;Feet supported Sitting balance-Leahy Scale: Fair     Standing balance support: Bilateral upper extremity supported;During functional activity Standing balance-Leahy Scale: Fair                               Pertinent Vitals/Pain Pain Assessment: Faces Pain Score: 8 (when you touch her lower extremeties  below the knee (L>R) ) Pain Location: B LE  Pain Descriptors / Indicators: Burning;Dull;Grimacing Pain Intervention(s): Monitored during session;Limited activity within patient's tolerance;Repositioned    Home Living Family/patient expects to be discharged to:: Private residence Living Arrangements: Spouse/significant other Available Help at Discharge: Skilled Nursing Facility Type of Home: House Home Access: Stairs to enter Entrance Stairs-Rails:  Left;Can reach Therapist, art of Steps: 2 Home Layout: One level Home Equipment: Environmental consultant - 2 wheels;Cane - single point      Prior Function Level of Independence: Independent with assistive device(s)         Comments: Per husband she uses RW in home occassionally, however at times gets up without it and walks around the house. Lately (past few day s) very SOB so had to stop and take breaks, was getting harder.      Hand Dominance        Extremity/Trunk Assessment        Lower Extremity Assessment Lower Extremity Assessment: Generalized weakness       Communication   Communication: No difficulties  Cognition Arousal/Alertness: Awake/alert Behavior During Therapy: WFL for tasks assessed/performed Overall Cognitive Status: History of cognitive impairments - at baseline                                        General Comments      Exercises Other Exercises Other Exercises: gently worked with 5x B LE knee flexion and BLE SLR with functional activities as AAROM    Assessment/Plan    PT Assessment Patient needs continued PT services  PT Problem List Decreased strength;Decreased mobility;Decreased activity tolerance       PT Treatment Interventions DME instruction;Gait training;Therapeutic activities;Therapeutic exercise;Functional mobility training;Stair training;Patient/family education    PT Goals (Current goals can be found in the Care Plan section)  Acute Rehab PT Goals Patient Stated Goal: I want to be able to go home  PT Goal Formulation: With patient/family Time For Goal Achievement: 11/01/17 Potential to Achieve Goals: Fair    Frequency Min 3X/week   Barriers to discharge        Co-evaluation               AM-PAC PT "6 Clicks" Daily Activity  Outcome Measure Difficulty turning over in bed (including adjusting bedclothes, sheets and blankets)?: Unable Difficulty moving from lying on back to sitting on the  side of the bed? : Unable Difficulty sitting down on and standing up from a chair with arms (e.g., wheelchair, bedside commode, etc,.)?: Unable Help needed moving to and from a bed to chair (including a wheelchair)?: Total Help needed walking in hospital room?: Total Help needed climbing 3-5 steps with a railing? : Total 6 Click Score: 6    End of Session   Activity Tolerance: Patient limited by fatigue;Other (comment)(4/4 dyspnea after session , resting in bed still after 3 minutes difficulty with talking and breathing. ) Patient left: in bed;with nursing/sitter in room;with bed alarm set;with family/visitor present Nurse Communication: Mobility status PT Visit Diagnosis: Unsteadiness on feet (R26.81);Muscle weakness (generalized) (M62.81)    Time: 1730-1830 PT Time Calculation (min) (ACUTE ONLY): 60 min   Charges:   PT Evaluation $PT Eval Moderate Complexity: 1 Mod PT Treatments $Gait Training: 8-22 mins $Therapeutic Activity: 23-37 mins   PT G Codes:        Marella Bile, PT Pager: [phone] 10/18/2017   Marella Bile  10/18/2017, 7:08 PM

## 2017-10-18 NOTE — Progress Notes (Signed)
PROGRESS NOTE    [name]  ZOX:[id number] DOB: 04-23-1931 DOA: 10/16/2017 PCP: Merlene Laughter, MD    Brief Narrative:  84 year old female who presented with dyspnea and lower extremity edema.  She does have significant past medical history for severe aortic stenosis, congestive heart failure, hypertension and dementia.  Reported 4 weeks of worsening dyspnea, associated lower extremity edema.  Her symptoms have been refractory to outpatient therapy with diuretics.  On her initial physical examination blood pressure 126/86, heart rate 98-114, respiratory 26, oxygen saturation 92%.  His membranes, lungs with rales bilaterally, heart S1-S2 present and rhythmic, 3 out of systolic murmur at the right sternal border, abdomen soft nontender, lower extremities with 2+ bilateral pitting edema.  139, potassium 3.2, chloride 105, bicarb 23, glucose 140, BUN 39, creatinine 1.38, BNP 2222, troponin 0.17, white count 14.7, hemoglobin 15.4, hematocrit 44.9, platelets 289.  Chest x-ray with cardiomegaly, increased vascular congestion, small bilateral pleural effusions.  EKG sinus rhythm, premature atrial complexes, left axis deviation, anterior fascicular block, interventricular conduction delay, poor R wave progression, LVH per EKG criteria.   Patient was admitted to the hospital with the working diagnosis of acute decompensation of diastolic heart failure, complicated with cardiogenic pulmonary edema.    Assessment & Plan:   Principal Problem:   Acute exacerbation of CHF (congestive heart failure) (HCC) Active Problems:   Hypertension   Aortic stenosis, severe   Dementia  1.  Acute decompensated systolic heart failure. Patient had 5 kg weight loss over last 24 hours, will continue diuresis with furosemide. Symptoms and edema have improved. Echocardiogram showed and reduction on LV systolic function with ef down to 15 to 20%. Will add low dose coreg and aldactone, hold on ace due to risk of worsening  renal function. Old records personally reviewed, patient has been evaluated for aortic valve replacement or TaVR, and found not good candidate due to dementia and poor functional status.   2.  Cardiogenic pulmonary edema. Responding well to furosemide, improvement in dyspnea, oxygen saturation, is 96% on room air.  3.  Severe aortic stenosis with valvular heart disease. Worsening LV function, patient not candidate for invasive procedures, very poor prognosis, will continue symptomatic control and medical therapy for heart failure.   4. CKD stage 3 with hypokalemia. Stable renal function with serum cr at 2.20 with K at 3,3 and serum bicarbonate at 26. Will correct hypokalemia with kc, follow on renal panel in am, continue diuresis with furosemide.    4.  Hypertension. Will add low dose carvedilol and aldactone with close follow up on blood pressure.   5. Dementia with new acute metabolic encephalopathy and delirium Continue memantine, donepizil and trazodone. Patient responded well to haldol that will be continue as needed. Physical therapy evaluation, may need home health services.   DVT prophylaxis: heparin  Code Status:  full Family Communication: I spoke with patient's husband at the bedside and all questions were addressed.  Disposition Plan: home when clinically improved    Consultants:     Procedures:     Antimicrobials:      Subjective: Patient has been very confused and agitated, willing to get out of the bed and noted significant ambulatory dysfunction. Patient required haldol for agitation. No nausea or vomiting.   Objective: Vitals:   10/17/17 1400 10/17/17 2012 10/18/17 0500 10/18/17 0628  BP: 121/71 (!) 110/91  116/85  Pulse: (!) 101 (!) 103  98  Resp: 20 18  (!) 22  Temp: 99.2 F (37.3 C) 97.8  F (36.6 C)  97.8 F (36.6 C)  TempSrc: Oral Oral  Oral  SpO2: 95% 94%  96%  Weight:   95.8 kg (211 lb 3.2 oz)   Height:        Intake/Output Summary  (Last 24 hours) at 10/18/2017 1105 Last data filed at 10/18/2017 0900 Gross per 24 hour  Intake -  Output 700 ml  Net -700 ml   Filed Weights   10/16/17 1830 10/17/17 0511 10/18/17 0500  Weight: 98.9 kg (218 lb) 100.5 kg (221 lb 9 oz) 95.8 kg (211 lb 3.2 oz)    Examination:   General: deconditioned  Neurology: Awake and alert, non focal./ confused and disorientated.   E ENT: mild pallor, no icterus, oral mucosa moist Cardiovascular: No JVD. S1-S2 present, rhythmic, no gallops, rubs, or murmurs. ++ pitting lower extremity edema. Pulmonary: decreased breath sounds bilaterally at bases, no wheezing, rhonchi or rales. Gastrointestinal. Abdomen protuberant with no organomegaly, non tender, no rebound or guarding Skin. No rashes Musculoskeletal: no joint deformities     Data Reviewed: I have personally reviewed following labs and imaging studies  CBC: Recent Labs  Lab 10/16/17 1901  WBC 14.7*  NEUTROABS 11.8*  HGB 15.4*  HCT 44.9  MCV 79.9  PLT 289   Basic Metabolic Panel: Recent Labs  Lab 10/16/17 1901 10/17/17 0115 10/18/17 0351  NA 139 140 140  K 3.2* 3.4* 3.3*  CL 105 105 103  CO2 23 25 26   GLUCOSE 140* 182* 134*  BUN 39* 40* 38*  CREATININE 1.38* 1.35* 1.20*  CALCIUM 9.1 9.4 8.9  MG 2.3  --   --    GFR: Estimated Creatinine Clearance: 39.6 mL/min (A) (by C-G formula based on SCr of 1.2 mg/dL (H)). Liver Function Tests: Recent Labs  Lab 10/16/17 1901  AST 21  ALT 17  ALKPHOS 78  BILITOT 2.2*  PROT 6.5  ALBUMIN 3.7   No results for input(s): LIPASE, AMYLASE in the last 168 hours. No results for input(s): AMMONIA in the last 168 hours. Coagulation Profile: No results for input(s): INR, PROTIME in the last 168 hours. Cardiac Enzymes: Recent Labs  Lab 10/16/17 1901 10/17/17 0115 10/17/17 0724  TROPONINI 0.17* 0.17* 0.14*   BNP (last 3 results) No results for input(s): PROBNP in the last 8760 hours. HbA1C: No results for input(s): HGBA1C in  the last 72 hours. CBG: No results for input(s): GLUCAP in the last 168 hours. Lipid Profile: No results for input(s): CHOL, HDL, LDLCALC, TRIG, CHOLHDL, LDLDIRECT in the last 72 hours. Thyroid Function Tests: No results for input(s): TSH, T4TOTAL, FREET4, T3FREE, THYROIDAB in the last 72 hours. Anemia Panel: No results for input(s): VITAMINB12, FOLATE, FERRITIN, TIBC, IRON, RETICCTPCT in the last 72 hours.    Radiology Studies: I have reviewed all of the imaging during this hospital visit personally     Scheduled Meds: . donepezil  10 mg Oral QHS  . furosemide  60 mg Intravenous BID  . heparin  5,000 Units Subcutaneous Q8H  . memantine  28 mg Oral QHS   Continuous Infusions:   LOS: 2 days        Mauricio Annett Gula, MD [facility] Pager [phone]

## 2017-10-19 LAB — BASIC METABOLIC PANEL
Anion gap: 10 (ref 5–15)
BUN: 35 mg/dL — AB (ref 6–20)
CALCIUM: 8.8 mg/dL — AB (ref 8.9–10.3)
CO2: 29 mmol/L (ref 22–32)
CREATININE: 1.3 mg/dL — AB (ref 0.44–1.00)
Chloride: 100 mmol/L — ABNORMAL LOW (ref 101–111)
GFR, EST AFRICAN AMERICAN: 42 mL/min — AB (ref >60)
GFR, EST NON AFRICAN AMERICAN: 37 mL/min — AB (ref >60)
Glucose, Bld: 133 mg/dL — ABNORMAL HIGH (ref 65–99)
Potassium: 3.6 mmol/L (ref 3.5–5.1)
SODIUM: 139 mmol/L (ref 135–145)

## 2017-10-19 MED ORDER — FUROSEMIDE 40 MG PO TABS
80.0000 mg | ORAL_TABLET | Freq: Two times a day (BID) | ORAL | Status: DC
  Administered 2017-10-19 – 2017-10-20 (×3): 80 mg via ORAL
  Filled 2017-10-19 (×3): qty 2

## 2017-10-19 NOTE — Progress Notes (Signed)
PROGRESS NOTE    [name]  BZJ:[id number] DOB: 1931-01-24 DOA: 10/16/2017 PCP: Merlene Laughter, MD    Brief Narrative:  84 year old female who presented with dyspnea and lower extremity edema.  She does have significant past medical history for severe aortic stenosis, congestive heart failure, hypertension and dementia.  Reported 4 weeks of worsening dyspnea, associated lower extremity edema.  Her symptoms have been refractory to outpatient therapy with diuretics.  On her initial physical examination blood pressure 126/86, heart rate 98-114, respiratory 26, oxygen saturation 92%.  His membranes, lungs with rales bilaterally, heart S1-S2 present and rhythmic, 3 out of systolic murmur at the right sternal border, abdomen soft nontender, lower extremities with 2+ bilateral pitting edema.  139, potassium 3.2, chloride 105, bicarb 23, glucose 140, BUN 39, creatinine 1.38, BNP 2222, troponin 0.17, white count 14.7, hemoglobin 15.4, hematocrit 44.9, platelets 289.  Chest x-ray with cardiomegaly, increased vascular congestion, small bilateral pleural effusions.  EKG sinus rhythm, premature atrial complexes, left axis deviation, anterior fascicular block, interventricular conduction delay, poor R wave progression, LVH per EKG criteria.   Patient was admitted to the hospital with the working diagnosis of acute decompensation of diastolic heart failure, complicated with cardiogenic pulmonary edema.    Assessment & Plan:   Principal Problem:   Acute exacerbation of CHF (congestive heart failure) (HCC) Active Problems:   Hypertension   Aortic stenosis, severe   Dementia  1.  Acute decompensated systolic heart failure with LV systolic function down to 15 to 20% due to progressive worsening aortic stenosis. Urine output over last 24 hours 1,500wi ml, will continue diuresis with po furosemide, to keep negative target balance. Tolerating well aldactone and carvedilol, patient has very poor prognosis due  to symptomatic aortic stenosis with reduction in LV systolic function.  Patient not candidate for surgical procedure due to poor functional status due to dementia.   2.  Cardiogenic pulmonary edema. Clinically has improved, will continue diuresis to target negative fluid balance. Oxymetry monitoring at 94% on room air.   4. CKD stage 3 with hypokalemia. Stable renal function with serum cr at 1.3  with K at 3,6 and serum bicarbonate at 29. Change furosemide to po and will follow on renal panel in am. Avoid hypotension or nephrotoxic medications.  4.  Hypertension. Tolerating well carvedilol and aldactone, systolic blood pressure 105 to 117 mmHg.   5. Dementia with new acute metabolic encephalopathy and delirium On memantine, donepizil and trazodone. Continue as needed haldol, patient's family at the bedside. Physical therapy recommendations for snf.    DVT prophylaxis: heparin  Code Status:  full Family Communication: I spoke with patient's husband at the bedside and all questions were addressed.  Disposition Plan: pending placement at SNF.    Consultants:     Procedures:     Antimicrobials:      Subjective: Patient is more calm today, her family is at bedside, no apparent dyspnea or chest pain, history is limited due to dementia.   Objective: Vitals:   10/18/17 1947 10/18/17 2057 10/19/17 0635 10/19/17 0652  BP:  110/72 117/74   Pulse:  84 65   Resp:  20 18   Temp:  (!) 97.5 F (36.4 C) 97.9 F (36.6 C)   TempSrc:  Oral Oral   SpO2: 92% 95% 93%   Weight:    95.2 kg (209 lb 14.1 oz)  Height:        Intake/Output Summary (Last 24 hours) at 10/19/2017 1307 Last data filed at 10/19/2017  0600 Gross per 24 hour  Intake 440 ml  Output 850 ml  Net -410 ml   Filed Weights   10/17/17 0511 10/18/17 0500 10/19/17 1610  Weight: 100.5 kg (221 lb 9 oz) 95.8 kg (211 lb 3.2 oz) 95.2 kg (209 lb 14.1 oz)    Examination:   General: continue to be deconditioned    Neurology: Awake and alert, non focal./ E ENT: mild pallor, no icterus, oral mucosa dry Cardiovascular: No JVD. S1-S2 present, rhythmic, no gallops, rubs, or murmurs. + pitting lower extremity edema. Pulmonary: decreased breath sounds bilaterally at bases, with no wheezing, rhonchi or rales. Gastrointestinal. Abdomen is protuberant with no organomegaly, non tender, no rebound or guarding Skin. No rashes Musculoskeletal: no joint deformities     Data Reviewed: I have personally reviewed following labs and imaging studies  CBC: Recent Labs  Lab 10/16/17 1901  WBC 14.7*  NEUTROABS 11.8*  HGB 15.4*  HCT 44.9  MCV 79.9  PLT 289   Basic Metabolic Panel: Recent Labs  Lab 10/16/17 1901 10/17/17 0115 10/18/17 0351 10/19/17 0431  NA 139 140 140 139  K 3.2* 3.4* 3.3* 3.6  CL 105 105 103 100*  CO2 23 25 26 29   GLUCOSE 140* 182* 134* 133*  BUN 39* 40* 38* 35*  CREATININE 1.38* 1.35* 1.20* 1.30*  CALCIUM 9.1 9.4 8.9 8.8*  MG 2.3  --   --   --    GFR: Estimated Creatinine Clearance: 36.4 mL/min (A) (by C-G formula based on SCr of 1.3 mg/dL (H)). Liver Function Tests: Recent Labs  Lab 10/16/17 1901  AST 21  ALT 17  ALKPHOS 78  BILITOT 2.2*  PROT 6.5  ALBUMIN 3.7   No results for input(s): LIPASE, AMYLASE in the last 168 hours. No results for input(s): AMMONIA in the last 168 hours. Coagulation Profile: No results for input(s): INR, PROTIME in the last 168 hours. Cardiac Enzymes: Recent Labs  Lab 10/16/17 1901 10/17/17 0115 10/17/17 0724  TROPONINI 0.17* 0.17* 0.14*   BNP (last 3 results) No results for input(s): PROBNP in the last 8760 hours. HbA1C: No results for input(s): HGBA1C in the last 72 hours. CBG: No results for input(s): GLUCAP in the last 168 hours. Lipid Profile: No results for input(s): CHOL, HDL, LDLCALC, TRIG, CHOLHDL, LDLDIRECT in the last 72 hours. Thyroid Function Tests: No results for input(s): TSH, T4TOTAL, FREET4, T3FREE, THYROIDAB in  the last 72 hours. Anemia Panel: No results for input(s): VITAMINB12, FOLATE, FERRITIN, TIBC, IRON, RETICCTPCT in the last 72 hours.    Radiology Studies: I have reviewed all of the imaging during this hospital visit personally     Scheduled Meds: . carvedilol  12.5 mg Oral BID WC  . donepezil  10 mg Oral QHS  . furosemide  80 mg Oral BID  . heparin  5,000 Units Subcutaneous Q8H  . memantine  28 mg Oral QHS  . spironolactone  25 mg Oral Daily   Continuous Infusions:   LOS: 3 days        Desjuan Stearns Annett Gula, MD [facility] Pager [phone]

## 2017-10-19 NOTE — Care Management Important Message (Signed)
Important Message  Patient Details  Name: [name] MRN: [id number] Date of Birth: [birth year]   Medicare Important Message Given:  Yes    Caren Macadam 10/19/2017, 12:02 PMImportant Message  Patient Details  Name: [name] MRN: [id number] Date of Birth: 12/28/1930   Medicare Important Message Given:  Yes    Caren Macadam 10/19/2017, 12:01 PM

## 2017-10-19 NOTE — Clinical Social Work Note (Signed)
Clinical Social Work Assessment  Patient Details  Name: [name] MRN: [id number] Date of Birth: 05/23/1930  Date of referral:  10/19/17               Reason for consult:  Facility Placement                Permission sought to share information with:    Permission granted to share information::     Name::        Agency::     Relationship::     Contact Information:     Housing/Transportation Living arrangements for the past 2 months:  Single Family Home Source of Information:  Spouse Patient Interpreter Needed:  None Criminal Activity/Legal Involvement Pertinent to Current Situation/Hospitalization:  No - Comment as needed Significant Relationships:  Spouse Lives with:  Spouse Do you feel safe going back to the place where you live?  (PT recommending SNF) Need for family participation in patient care:  Yes (Comment)  Care giving concerns:  Patient from home with husband. Patient's husband reported that patient usually requires no assistance with ambulation at baseline. Patient reported that patient has been needing assistance with ADLs and has an aide from ([facility]) coming 3x/week to assist with ADLs. Patient's husband reported that due to patient's dementia she is very dependent on others. PT recommending SNF.   Social Worker assessment / plan:  CSW spoke with patient's husband at bedside regarding patient's discharge planning, patient asleep and did not participate in assessment. CSW explained PT recommendation for SNF. Patient's husband reported that patient has been to SNF in the past multiple times. Patient's husband reported that he prefers that patient return home because he feels patient will get lonely at SNF because patient doesn't like to be away from husband. Patient's husband reported that he will follow patient's attending MD recommendation but prefers to take patient home.   CSW updated patient's attending MD of patient's husband's preference to take patient  home. Attending MD agreed to follow up with patient's family regarding discharge plan.   CSW will continue to follow and assist as appropriate.    Employment status:  Retired Health and safety inspector:  Medicare PT Recommendations:  Skilled Nursing Facility Information / Referral to community resources:  Skilled Nursing Facility  Patient/Family's Response to care:  Patient's husband appreciative of CSW assistance with discharge planning. Patient's husband reported that he prefers for patient to return home but will follow recommendation of patient's attending MD.  Patient/Family's Understanding of and Emotional Response to Diagnosis, Current Treatment, and Prognosis:  Patient only oriented to self and unable to participate in assessment. Patient's husband involved in patient's care and verbalized preference to take patient home versus patient going to SNF. Patient's husband verbalized concerns about how patient will feel if discharged to SNF. CSW acknowledged patient's husband's concerns. Patient's husband reported that he is agreeable to follow patient's attending MD recommendation.   Emotional Assessment Appearance:  Appears stated age Attitude/Demeanor/Rapport:  Unable to Assess Affect (typically observed):  Unable to Assess Orientation:  Oriented to Self Alcohol / Substance use:  Not Applicable Psych involvement (Current and /or in the community):  No (Comment)  Discharge Needs  Concerns to be addressed:  Care Coordination Readmission within the last 30 days:  No Current discharge risk:  Physical Impairment Barriers to Discharge:  Other(Patient's husband prefers to take patient home but wants to know if MD is agreeable)   Antionette Poles, LCSW 10/19/2017, 4:29 PM

## 2017-10-20 DIAGNOSIS — I509 Heart failure, unspecified: Secondary | ICD-10-CM | POA: Diagnosis not present

## 2017-10-20 DIAGNOSIS — F0390 Unspecified dementia without behavioral disturbance: Secondary | ICD-10-CM | POA: Diagnosis not present

## 2017-10-20 DIAGNOSIS — M2550 Pain in unspecified joint: Secondary | ICD-10-CM | POA: Diagnosis not present

## 2017-10-20 DIAGNOSIS — R2681 Unsteadiness on feet: Secondary | ICD-10-CM | POA: Diagnosis not present

## 2017-10-20 DIAGNOSIS — I350 Nonrheumatic aortic (valve) stenosis: Secondary | ICD-10-CM | POA: Diagnosis not present

## 2017-10-20 DIAGNOSIS — Z743 Need for continuous supervision: Secondary | ICD-10-CM | POA: Diagnosis not present

## 2017-10-20 DIAGNOSIS — R600 Localized edema: Secondary | ICD-10-CM | POA: Diagnosis not present

## 2017-10-20 DIAGNOSIS — R278 Other lack of coordination: Secondary | ICD-10-CM | POA: Diagnosis not present

## 2017-10-20 DIAGNOSIS — M6281 Muscle weakness (generalized): Secondary | ICD-10-CM | POA: Diagnosis not present

## 2017-10-20 DIAGNOSIS — I998 Other disorder of circulatory system: Secondary | ICD-10-CM | POA: Diagnosis not present

## 2017-10-20 DIAGNOSIS — R279 Unspecified lack of coordination: Secondary | ICD-10-CM | POA: Diagnosis not present

## 2017-10-20 DIAGNOSIS — I5023 Acute on chronic systolic (congestive) heart failure: Secondary | ICD-10-CM

## 2017-10-20 DIAGNOSIS — R488 Other symbolic dysfunctions: Secondary | ICD-10-CM | POA: Diagnosis not present

## 2017-10-20 DIAGNOSIS — R0602 Shortness of breath: Secondary | ICD-10-CM | POA: Diagnosis not present

## 2017-10-20 DIAGNOSIS — R5383 Other fatigue: Secondary | ICD-10-CM | POA: Diagnosis not present

## 2017-10-20 DIAGNOSIS — J9600 Acute respiratory failure, unspecified whether with hypoxia or hypercapnia: Secondary | ICD-10-CM | POA: Diagnosis not present

## 2017-10-20 DIAGNOSIS — D72829 Elevated white blood cell count, unspecified: Secondary | ICD-10-CM | POA: Diagnosis not present

## 2017-10-20 DIAGNOSIS — Z6835 Body mass index (BMI) 35.0-35.9, adult: Secondary | ICD-10-CM | POA: Diagnosis not present

## 2017-10-20 DIAGNOSIS — I10 Essential (primary) hypertension: Secondary | ICD-10-CM | POA: Diagnosis not present

## 2017-10-20 DIAGNOSIS — Z7401 Bed confinement status: Secondary | ICD-10-CM | POA: Diagnosis not present

## 2017-10-20 DIAGNOSIS — R1312 Dysphagia, oropharyngeal phase: Secondary | ICD-10-CM | POA: Diagnosis not present

## 2017-10-20 DIAGNOSIS — I359 Nonrheumatic aortic valve disorder, unspecified: Secondary | ICD-10-CM | POA: Diagnosis not present

## 2017-10-20 LAB — BASIC METABOLIC PANEL
Anion gap: 10 (ref 5–15)
BUN: 36 mg/dL — ABNORMAL HIGH (ref 8–23)
CHLORIDE: 101 mmol/L (ref 98–111)
CO2: 27 mmol/L (ref 22–32)
CREATININE: 1.35 mg/dL — AB (ref 0.44–1.00)
Calcium: 8.9 mg/dL (ref 8.9–10.3)
GFR calc non Af Amer: 35 mL/min — ABNORMAL LOW (ref >60)
GFR, EST AFRICAN AMERICAN: 41 mL/min — AB (ref >60)
Glucose, Bld: 147 mg/dL — ABNORMAL HIGH (ref 70–99)
Potassium: 4.1 mmol/L (ref 3.5–5.1)
Sodium: 138 mmol/L (ref 135–145)

## 2017-10-20 MED ORDER — SPIRONOLACTONE 25 MG PO TABS: 25.0000 mg | ORAL_TABLET | Freq: Every day | ORAL | 0 refills | Status: AC

## 2017-10-20 MED ORDER — CARVEDILOL 12.5 MG PO TABS: 12.5000 mg | ORAL_TABLET | Freq: Two times a day (BID) | ORAL | 0 refills | Status: AC

## 2017-10-20 NOTE — Progress Notes (Signed)
Physical Therapy Treatment Patient Details Name: [name] MRN: [id number] DOB: Jan 19, 1931 Today's Date: 10/20/2017    History of Present Illness 84 year old female who presented to ED  dyspnea and lower extremity edema.  Pt has significant past medical history for severe aortic stenosis, congestive heart failure, hypertension and dementia.  Reported 4 weeks of worsening dyspnea, associated lower extremity edema    PT Comments    Patient not progressing with mobility needing increased assist due to lethargy today and increased time to respond to commands with multimodal cues.  Continue to recommend SNF level rehab at d/c.  PT to follow acutely.   Follow Up Recommendations  SNF     Equipment Recommendations  None recommended by PT    Recommendations for Other Services       Precautions / Restrictions Precautions Precautions: Fall    Mobility  Bed Mobility Overal bed mobility: Needs Assistance Bed Mobility: Rolling;Sidelying to Sit Rolling: Max assist;+2 for physical assistance Sidelying to sit: Max assist;+2 for physical assistance   Sit to supine: Total assist;+2 for physical assistance   General bed mobility comments: patient lethargic and needed increased time and multimodal cues to assist with any mobility,    Transfers Overall transfer level: Needs assistance   Transfers: Sit to/from Stand Sit to Stand: +2 physical assistance;Max assist;From elevated surface         General transfer comment: lifting assist from elevated bed with hands on walker and max cues increased time to respond to cues, unable to take steps so sat while bed changed and stood once more to replace clean linen under her.  soiled with urine so cleaned perienal area while standing  Ambulation/Gait                 Stairs             Wheelchair Mobility    Modified Rankin (Stroke Patients Only)       Balance Overall balance assessment: Needs  assistance Sitting-balance support: Bilateral upper extremity supported;Feet supported Sitting balance-Leahy Scale: Poor Sitting balance - Comments: minguard at least for balance sitting EOB   Standing balance support: Bilateral upper extremity supported;During functional activity Standing balance-Leahy Scale: Zero Standing balance comment: UE supported on walker and assist for keeping hips forward and head upright                            Cognition Arousal/Alertness: Lethargic Behavior During Therapy: Flat affect Overall Cognitive Status: Impaired/Different from baseline                                 General Comments: more lethargic today per spouse (RN reports was alert to take meds this am, but she did have Haldol and other sedating meds overnight)      Exercises General Exercises - Lower Extremity Heel Slides: AAROM;5 reps;Supine    General Comments        Pertinent Vitals/Pain Pain Assessment: Faces Faces Pain Scale: Hurts little more Pain Location: B LE  Pain Descriptors / Indicators: Grimacing;Guarding Pain Intervention(s): Monitored during session;Repositioned;Limited activity within patient's tolerance    Home Living                      Prior Function            PT Goals (current goals can now be found in the care plan  section) Progress towards PT goals: Not progressing toward goals - comment(more lethargic today)    Frequency    Min 2X/week      PT Plan Current plan remains appropriate;Frequency needs to be updated    Co-evaluation PT/OT/SLP Co-Evaluation/Treatment: Yes Reason for Co-Treatment: Complexity of the patient's impairments (multi-system involvement);For patient/therapist safety PT goals addressed during session: Mobility/safety with mobility;Balance;Proper use of DME        AM-PAC PT "6 Clicks" Daily Activity  Outcome Measure  Difficulty turning over in bed (including adjusting bedclothes,  sheets and blankets)?: Unable Difficulty moving from lying on back to sitting on the side of the bed? : Unable Difficulty sitting down on and standing up from a chair with arms (e.g., wheelchair, bedside commode, etc,.)?: Unable Help needed moving to and from a bed to chair (including a wheelchair)?: Total Help needed walking in hospital room?: Total Help needed climbing 3-5 steps with a railing? : Total 6 Click Score: 6    End of Session Equipment Utilized During Treatment: Gait belt Activity Tolerance: Patient limited by fatigue Patient left: in bed;with call bell/phone within reach;with bed alarm set Nurse Communication: Other (comment)(bed soiled, needed new pure wick) PT Visit Diagnosis: Unsteadiness on feet (R26.81);Muscle weakness (generalized) (M62.81)     Time: 6468-0321 PT Time Calculation (min) (ACUTE ONLY): 26 min  Charges:  $Therapeutic Activity: 8-22 mins                    G CodesSheran Lawless, [place] [phone] 10/20/2017    Elray Mcgregor 10/20/2017, 2:33 PM

## 2017-10-20 NOTE — Discharge Summary (Signed)
Physician Discharge Summary  [name] YNW:[id number] DOB: 09-10-1930 DOA: 10/16/2017  PCP: Merlene Laughter, MD  Admit date: 10/16/2017 Discharge date: 10/20/2017  Admitted From: Home  Disposition:  SNF  Recommendations for Outpatient Follow-up and new medication changes:  1. Follow up with Dr. Pete Glatter in 7 days.  2. Patient has been placed on low dose carvedilol and added spironolactone 3. Continue furosemide to 80 mg po bid.  4. Follow renal panel in 7 days  Home Health: Na  Equipment/Devices: walker   Discharge Condition: stable  CODE STATUS: DNR  Diet recommendation: Heart healthy.   Brief/Interim Summary: 84 year old female who presented with dyspnea and lower extremity edema. She does have significant past medical history for severe aortic stenosis, congestive heart failure, hypertension and dementia. Reported 4 weeks of worsening dyspnea, associated lower extremity edema.Her symptoms have been refractory to outpatient therapy with diuretics. On her initial physical examination blood pressure 126/86, heart rate 98-114, respiratory rate 26, oxygen saturation 92%. Moist membranes, lungs with rales bilaterally, heart S1-S2 present and rhythmic, positive systolic murmur at the right sternal border 3/6, abdomen soft nontender, lower extremities with 2+ bilateral pitting edema.Sodium 139, potassium 3.2, chloride 105, bicarb 23, glucose 140, BUN 39, creatinine 1.38, BNP 2222, troponin 0.17, white count 14.7, hemoglobin 15.4, hematocrit 44.9, platelets 289.Chest x-ray with cardiomegaly, increased vascular congestion, small bilateral pleural effusions. EKG sinus rhythm, premature atrial complexes, left axis deviation, anterior fascicular block, interventricular conduction delay, poor R wave progression, LVH per EKG criteria.  Patient was admitted to the hospital withtheworking diagnosis of acute decompensation of diastolic heart failure, complicated with cardiogenic  pulmonary edema.  1.Acute decompensated systolic heart failure with LV systolic function down to 15 to 20% due to progressive worsening aortic stenosis.  Patient was admitted to the medical ward, she was placed on a remote telemetry monitor, received aggressive diuresis with intravenous furosemide, negative fluid balance was achieved with significant improvement in her symptoms.  Further work-up with echocardiography showed a reduction on her LV systolic function down to 15 to 20%, with diffuse hypokinesis.  Aortic valve with severe restriction of valve mobility, critical stenosis, PA pressure was 55 with a dilated right ventricle cavity and reduction in systolic function.  Patient was placed on carvedilol and spironolacone with good toleration.   Patient does have a very poor prognosis due to her worsening heart failure related to worsening aortic stenosis.  This was discussed with her husband at the bedside, patient CODE STATUS has been addressed in the past, she has decided to be DNR.  Follow-up with the outpatient cardiology clinic.  Continue diuresis with furosemide.  Patient was seen by physical therapy, she was found to be significantly weak, recommended skilled nurse facility at discharge.  2.  Acute cardiogenic pulmonary edema.  Patient was diuresis with improvement of her symptoms, her discharge oximetry 92% on room air.  3.  Dementia with metabolic encephalopathy and delirium.  Patient was continued on memantine, donepezil and trazodone.  She had episodes of agitation and severe confusion that required Haldol. Patient has responded well to antipsychotic agents.  Continue physical therapy at the skilled nursing facility.  4.  Chronic kidney disease stage III with hypokalemia.  Her kidney function remained stable with a creatinine 1.3, potassium was corrected with potassium chloride, patient will continue furosemide for diuresis.  Follow-up kidney function as an outpatient.  5.   Hypertension.  Patient was placed on low-dose carvedilol, continue aggressive diuresis.  Discharge blood pressure 116/63.   Discharge Diagnoses:  Principal Problem:   Acute exacerbation of CHF (congestive heart failure) (HCC) Active Problems:   Hypertension   Aortic stenosis, severe   Dementia    Discharge Instructions   Allergies as of 10/20/2017      Reactions   Lisinopril Cough   Micardis [telmisartan] Other (See Comments)   dizziness      Medication List    STOP taking these medications   metolazone 2.5 MG tablet Commonly known as:  ZAROXOLYN     TAKE these medications   acetaminophen 325 MG tablet Commonly known as:  TYLENOL Take 650 mg by mouth every 6 (six) hours as needed.   aspirin EC 81 MG tablet Take 81 mg by mouth every evening.   carvedilol 12.5 MG tablet Commonly known as:  COREG Take 1 tablet (12.5 mg total) by mouth 2 (two) times daily with a meal.   EQ GLUCOSAMINE CHONDR/MSM/D Tabs Take 1 tablet by mouth daily.   furosemide 40 MG tablet Commonly known as:  LASIX Take 80 mg by mouth 2 (two) times daily.   IRON-C PO Take 65 mg by mouth every evening.   NAMZARIC 28-10 MG Cp24 Generic drug:  Memantine HCl-Donepezil HCl Take 1 capsule by mouth every evening.   Omega-3 Krill Oil 500 MG Caps Take 500 mg by mouth daily.   PRESERVISION AREDS 2 PO Take 1 tablet by mouth daily.   spironolactone 25 MG tablet Commonly known as:  ALDACTONE Take 1 tablet (25 mg total) by mouth daily.       Allergies  Allergen Reactions  . Lisinopril Cough  . Micardis [Telmisartan] Other (See Comments)    dizziness    Consultations:     Procedures/Studies: Dg Chest 2 View  Result Date: 10/16/2017 CLINICAL DATA:  Shortness of breath. EXAM: CHEST - 2 VIEW COMPARISON:  Radiographs of September 26, 2013. FINDINGS: Stable cardiomegaly with central pulmonary vascular congestion is noted. No pneumothorax is noted. Small bilateral pleural effusions are noted. No  consolidative process is noted. Status post bilateral shoulder arthroplasties. IMPRESSION: Stable cardiomegaly with mild central pulmonary vascular congestion. Minimal bilateral pleural effusions are noted. Electronically Signed   By: Lupita Raider, M.D.   On: 10/16/2017 20:02       Subjective: Patient reports improve dyspnea, noted to be very weak. No nausea or vomiting, no abdominal pain. Her edema has significantly improved per his husband at the bedside.   Discharge Exam: Vitals:   10/20/17 0423 10/20/17 0839  BP: 98/81 116/63  Pulse: 69 67  Resp: 16   Temp: 99.1 F (37.3 C)   SpO2: (!) 88%    Vitals:   10/19/17 1324 10/19/17 2202 10/20/17 0423 10/20/17 0839  BP: 105/70 105/67 98/81 116/63  Pulse: 62 77 69 67  Resp: 18 15 16    Temp: 98.4 F (36.9 C) 97.7 F (36.5 C) 99.1 F (37.3 C)   TempSrc: Oral Oral Oral   SpO2: 94% 92% (!) 88%   Weight:   95.5 kg (210 lb 8.6 oz)   Height:        General: Not in pain or dyspnea. Deconditioned.  Neurology: Somnolent, not focal, able to respond to questions, positive confusion, no agitation.  E ENT: no pallor, no icterus, oral mucosa moist Cardiovascular: No JVD. S1-S2 present, rhythmic, no gallops, rubs, or murmurs. ++  lower extremity edema. Pulmonary: vesicular breath sounds bilaterally, adequate air movement, no wheezing, rhonchi or rales. Gastrointestinal. Abdomen peotuberant no organomegaly, non tender, no rebound or guarding Skin. Venous  stasis changes distal lower extremities.  Musculoskeletal: no joint deformities   The results of significant diagnostics from this hospitalization (including imaging, microbiology, ancillary and laboratory) are listed below for reference.     Microbiology: No results found for this or any previous visit (from the past 240 hour(s)).   Labs: BNP (last 3 results) Recent Labs    10/16/17 1901  BNP 2,222.6*   Basic Metabolic Panel: Recent Labs  Lab 10/16/17 1901 10/17/17 0115  10/18/17 0351 10/19/17 0431 10/20/17 0453  NA 139 140 140 139 138  K 3.2* 3.4* 3.3* 3.6 4.1  CL 105 105 103 100* 101  CO2 23 25 26 29 27   GLUCOSE 140* 182* 134* 133* 147*  BUN 39* 40* 38* 35* 36*  CREATININE 1.38* 1.35* 1.20* 1.30* 1.35*  CALCIUM 9.1 9.4 8.9 8.8* 8.9  MG 2.3  --   --   --   --    Liver Function Tests: Recent Labs  Lab 10/16/17 1901  AST 21  ALT 17  ALKPHOS 78  BILITOT 2.2*  PROT 6.5  ALBUMIN 3.7   No results for input(s): LIPASE, AMYLASE in the last 168 hours. No results for input(s): AMMONIA in the last 168 hours. CBC: Recent Labs  Lab 10/16/17 1901  WBC 14.7*  NEUTROABS 11.8*  HGB 15.4*  HCT 44.9  MCV 79.9  PLT 289   Cardiac Enzymes: Recent Labs  Lab 10/16/17 1901 10/17/17 0115 10/17/17 0724  TROPONINI 0.17* 0.17* 0.14*   BNP: Invalid input(s): POCBNP CBG: No results for input(s): GLUCAP in the last 168 hours. D-Dimer No results for input(s): DDIMER in the last 72 hours. Hgb A1c No results for input(s): HGBA1C in the last 72 hours. Lipid Profile No results for input(s): CHOL, HDL, LDLCALC, TRIG, CHOLHDL, LDLDIRECT in the last 72 hours. Thyroid function studies No results for input(s): TSH, T4TOTAL, T3FREE, THYROIDAB in the last 72 hours.  Invalid input(s): FREET3 Anemia work up No results for input(s): VITAMINB12, FOLATE, FERRITIN, TIBC, IRON, RETICCTPCT in the last 72 hours. Urinalysis    Component Value Date/Time   COLORURINE YELLOW 09/26/2013 1047   APPEARANCEUR CLEAR 09/26/2013 1047   LABSPEC 1.027 09/26/2013 1047   PHURINE 5.0 09/26/2013 1047   GLUCOSEU NEGATIVE 09/26/2013 1047   HGBUR NEGATIVE 09/26/2013 1047   BILIRUBINUR NEGATIVE 09/26/2013 1047   KETONESUR NEGATIVE 09/26/2013 1047   PROTEINUR NEGATIVE 09/26/2013 1047   UROBILINOGEN 0.2 09/26/2013 1047   NITRITE NEGATIVE 09/26/2013 1047   LEUKOCYTESUR NEGATIVE 09/26/2013 1047   Sepsis Labs Invalid input(s): PROCALCITONIN,  WBC,  LACTICIDVEN Microbiology No  results found for this or any previous visit (from the past 240 hour(s)).   Time coordinating discharge: 45 minutes  SIGNED:   Coralie Keens, MD  [facility] 10/20/2017, 1:17 PM Pager [phone]  If 7PM-7AM, please contact night-coverage [email or web address] Password [email or web address]

## 2017-10-20 NOTE — Progress Notes (Signed)
I did spoke with Mr. Tad Moore, patient's husband, about patient's condition, likely worsening heart failure due to aortic stenosis, we discussed advanced directives.  He does mention that Mrs. Biegler does have a DO NOT RESUSCITATE form at home.  We discussed that likely this is appropriate due to patient's condition and poor prognosis.  Will sign a form for DNR while she is hospitalized.

## 2017-10-20 NOTE — Progress Notes (Signed)
Called [facility] again and awaiting a return phone call with information regarding transport.

## 2017-10-20 NOTE — Clinical Social Work Placement (Signed)
Patient received and accepted bed offer at [facility]. Facility aware of patient's discharge and confirmed bed offer. [facility] contacted. Patient's family notified. Patient's RN can call report to [phone] Room 902P, packet complete. CSW signing off, no other needs identified at this time.  CLINICAL SOCIAL WORK PLACEMENT  NOTE  Date:  10/20/2017  Patient Details  Name: [name] MRN: [id number] Date of Birth: 09-12-1930  Clinical Social Work is seeking post-discharge placement for this patient at the Skilled  Nursing Facility level of care (*CSW will initial, date and re-position this form in  chart as items are completed):  Yes   Patient/family provided with [place] Clinical Social Work Department's list of facilities offering this level of care within the geographic area requested by the patient (or if unable, by the patient's family).  Yes   Patient/family informed of their freedom to choose among providers that offer the needed level of care, that participate in Medicare, Medicaid or managed care program needed by the patient, have an available bed and are willing to accept the patient.  Yes   Patient/family informed of [facility] [place]'s ownership interest in [facility] and [facility], as well as of the fact that they are under no obligation to receive care at these facilities.  PASRR submitted to EDS on       PASRR number received on       Existing PASRR number confirmed on 10/20/17     FL2 transmitted to all facilities in geographic area requested by pt/family on 10/20/17     FL2 transmitted to all facilities within larger geographic area on       Patient informed that his/her managed care company has contracts with or will negotiate with certain facilities, including the following:        Yes   Patient/family informed of bed offers received.  Patient chooses bed at [facility]     Physician recommends and patient chooses bed at      Patient to be  transferred to [facility] [place] on 10/20/17.  Patient to be transferred to facility by [facility]     Patient family notified on 10/20/17 of transfer.  Name of family member notified:  [name]; [name]     PHYSICIAN       Additional Comment:    _______________________________________________ Antionette Poles, LCSW 10/20/2017, 2:33 PM

## 2017-10-20 NOTE — Progress Notes (Signed)
Called [facility] to see approximately how much longer it would be before the patient would be picked up and taken to [facility]. Dispatcher, Dreama [phone]), stated that there were 3 more patients ahead of her and it would be approximately 1 to 1 1/2 hours. Informed husband and he was very appreciative of this information.

## 2017-10-20 NOTE — Evaluation (Addendum)
Occupational Therapy Evaluation Patient Details Name: [name] MRN: [id number] DOB: [birth year] Today's Date: 10/20/2017    History of Present Illness 84 year old female who presented to ED  dyspnea and lower extremity edema.  Pt has significant past medical history for severe aortic stenosis, congestive heart failure, hypertension and dementia.  Reported 4 weeks of worsening dyspnea, associated lower extremity edema   Clinical Impression   Per husband, pt able to take care of her own ADL PTA. Currently pt requires max-total assist for ADL and max assist +2 for sit to stand x2 at EOB. Per husband, pt with increased lethargy today (notified RN). Recommending SNF for follow up to maximize independence and safety with ADL and functional mobility prior to return home. Pt would benefit from continued skilled OT to address established goals.     Follow Up Recommendations  SNF;Supervision/Assistance - 24 hour    Equipment Recommendations  Other (comment)(TBD at next venue)    Recommendations for Other Services       Precautions / Restrictions Precautions Precautions: Fall Restrictions Weight Bearing Restrictions: No      Mobility Bed Mobility Overal bed mobility: Needs Assistance Bed Mobility: Rolling;Sidelying to Sit Rolling: Max assist;+2 for physical assistance Sidelying to sit: Max assist;+2 for physical assistance   Sit to supine: Total assist;+2 for physical assistance   General bed mobility comments: patient lethargic and needed increased time and multimodal cues to assist with any mobility,    Transfers Overall transfer level: Needs assistance Equipment used: Rolling walker (2 wheeled) Transfers: Sit to/from Stand Sit to Stand: +2 physical assistance;Max assist;From elevated surface         General transfer comment: lifting assist from elevated bed with hands on walker and max cues increased time to respond to cues, unable to take steps so sat while bed changed  and stood once more to replace clean linen under her.  soiled with urine so cleaned perienal area while standing    Balance Overall balance assessment: Needs assistance Sitting-balance support: Feet supported;Bilateral upper extremity supported Sitting balance-Leahy Scale: Poor Sitting balance - Comments: minguard at least for balance sitting EOB   Standing balance support: Bilateral upper extremity supported;During functional activity Standing balance-Leahy Scale: Zero Standing balance comment: UE supported on walker and assist for keeping hips forward and head upright                           ADL either performed or assessed with clinical judgement   ADL Overall ADL's : Needs assistance/impaired     Grooming: Maximal assistance;Sitting   Upper Body Bathing: Total assistance;Sitting   Lower Body Bathing: Total assistance;+2 for physical assistance;Sit to/from stand   Upper Body Dressing : Maximal assistance;Sitting   Lower Body Dressing: Total assistance;+2 for physical assistance;Sit to/from stand       Toileting- Architect and Hygiene: Total assistance;+2 for physical assistance;Sit to/from stand;Bed level Toileting - Clothing Manipulation Details (indicate cue type and reason): for peri care     Functional mobility during ADLs: Maximal assistance;+2 for physical assistance;Rolling walker(for sit to stand x2 at EOB only)       Vision   Additional Comments: eyes closed throughout most of session     Perception     Praxis      Pertinent Vitals/Pain Pain Assessment: Faces Faces Pain Scale: Hurts little more Pain Location: B LE  Pain Descriptors / Indicators: Grimacing;Guarding Pain Intervention(s): Monitored during session;Limited activity within patient's tolerance;Repositioned  Hand Dominance     Extremity/Trunk Assessment Upper Extremity Assessment Upper Extremity Assessment: Generalized weakness   Lower Extremity  Assessment Lower Extremity Assessment: Defer to PT evaluation       Communication Communication Communication: No difficulties   Cognition Arousal/Alertness: Lethargic Behavior During Therapy: Flat affect Overall Cognitive Status: Impaired/Different from baseline                                 General Comments: more lethargic today per spouse (RN reports was alert to take meds this am, but she did have Haldol and other sedating meds overnight)   General Comments       Exercises General Exercises - Lower Extremity Heel Slides: AAROM;5 reps;Supine   Shoulder Instructions      Home Living Family/patient expects to be discharged to:: Skilled nursing facility                                        Prior Functioning/Environment Level of Independence: Independent with assistive device(s)        Comments: Per husband she uses RW in home occassionally, however at times gets up without it and walks around the house. Lately (past few day s) very SOB so had to stop and take breaks, was getting harder.         OT Problem List: Decreased strength;Decreased range of motion;Decreased activity tolerance;Impaired balance (sitting and/or standing);Decreased cognition;Decreased safety awareness;Decreased knowledge of use of DME or AE;Obesity;Pain;Increased edema      OT Treatment/Interventions: Self-care/ADL training;Therapeutic exercise;DME and/or AE instruction;Therapeutic activities;Patient/family education;Balance training    OT Goals(Current goals can be found in the care plan section) Acute Rehab OT Goals Patient Stated Goal: to get better OT Goal Formulation: With patient/family Time For Goal Achievement: 11/03/17 Potential to Achieve Goals: Good ADL Goals Pt Will Perform Grooming: with min guard assist;sitting Pt Will Transfer to Toilet: with max assist;stand pivot transfer;bedside commode Additional ADL Goal #1: Pt will perform bed mobility with  mod assist in preparation for ADL/mobility  OT Frequency: Min 2X/week   Barriers to D/C:            Co-evaluation PT/OT/SLP Co-Evaluation/Treatment: Yes Reason for Co-Treatment: Complexity of the patient's impairments (multi-system involvement);For patient/therapist safety;To address functional/ADL transfers PT goals addressed during session: Mobility/safety with mobility;Balance;Proper use of DME OT goals addressed during session: ADL's and self-care      AM-PAC PT "6 Clicks" Daily Activity     Outcome Measure Help from another person eating meals?: A Lot Help from another person taking care of personal grooming?: A Lot Help from another person toileting, which includes using toliet, bedpan, or urinal?: Total Help from another person bathing (including washing, rinsing, drying)?: A Lot Help from another person to put on and taking off regular upper body clothing?: A Lot Help from another person to put on and taking off regular lower body clothing?: Total 6 Click Score: 10   End of Session Equipment Utilized During Treatment: Gait belt;Rolling walker Nurse Communication: Mobility status;Other (comment)(pt very lethargic, removed purewick)  Activity Tolerance: Patient limited by lethargy Patient left: in bed;with call bell/phone within reach;with bed alarm set;with family/visitor present  OT Visit Diagnosis: Unsteadiness on feet (R26.81);Other abnormalities of gait and mobility (R26.89);Muscle weakness (generalized) (M62.81);Pain Pain - Right/Left: (bil) Pain - part of body: Leg  Time: 7482-7078 OT Time Calculation (min): 26 min Charges:  OT General Charges $OT Visit: 1 Visit OT Evaluation $OT Eval Moderate Complexity: 1 Mod G-Codes:     Tunis Gentle A. Brett Albino, M.S., OTR/L Acute Rehab Department: [phone]  Gaye Alken 10/20/2017, 3:46 PM

## 2017-10-20 NOTE — Progress Notes (Addendum)
[facility] here to pick patient up. Will call [facility] to let them know she is on the way. Tried [employer] and no one answered the phone.

## 2017-10-20 NOTE — Progress Notes (Signed)
CSW informed by patient's RN that patient's husband is now interested in patient discharging to SNF for ST rehab.  CSW spoke with patient's husband who confirmed plan for patient to dc to SNF for ST rehab. Patient's husband requested that CSW contact [facility] as it is there first choice.  CSW will complete patient's FL2 and follow up with [facility].  Celso Sickle, [place] Clinical Social Worker [facility] Cell#: [phone]

## 2017-10-20 NOTE — [place]2 (Signed)
[place] MEDICAID FL2 LEVEL OF CARE SCREENING TOOL     IDENTIFICATION  Patient Name: [name] Birthdate: 04-Dec-1930 Sex: female Admission Date (Current Location): 10/16/2017  [facility] and [place] Number:  Producer, television/film/video and Address:  [facility],  [address] [place], [place] [address]      Provider Number: [id number]  Attending Physician Name and Address:  Coralie Keens  Relative Name and Phone Number:       Current Level of Care: Hospital Recommended Level of Care: Skilled Nursing Facility Prior Approval Number:    Date Approved/Denied:   PASRR Number: [id number]  Discharge Plan: SNF    Current Diagnoses: Patient Active Problem List   Diagnosis Date Noted  . Acute exacerbation of CHF (congestive heart failure) (HCC) 10/16/2017  . Dementia 10/17/2013  . Knee pain, left 10/12/2013  . Unspecified constipation 10/12/2013  . Postoperative anemia due to acute blood loss 10/06/2013  . S/P total knee replacement using cement 10/04/2013  . Thrombocytopenia (HCC) 04/05/2012  . Osteoarthritis of shoulder 06/04/2011  . Hypertension 06/04/2011  . Aortic stenosis, severe 06/04/2011  . Obesity, Class II, BMI 35-39.9, with comorbidity 06/04/2011    Orientation RESPIRATION BLADDER Height & Weight     Self  Normal Incontinent Weight: 210 lb 8.6 oz (95.5 kg) Height:  5' 4.5" (163.8 cm)  BEHAVIORAL SYMPTOMS/MOOD NEUROLOGICAL BOWEL NUTRITION STATUS        Diet(see dc summary)  AMBULATORY STATUS COMMUNICATION OF NEEDS Skin   Extensive Assist Verbally Normal                       Personal Care Assistance Level of Assistance  Bathing, Feeding, Dressing Bathing Assistance: Maximum assistance Feeding assistance: Maximum assistance Dressing Assistance: Maximum assistance     Functional Limitations Info  Sight, Hearing, Speech Sight Info: Adequate Hearing Info: Impaired Speech Info: Adequate    SPECIAL CARE FACTORS FREQUENCY  PT  (By licensed PT), OT (By licensed OT)     PT Frequency: 5x/week OT Frequency: 5x/week            Contractures      Additional Factors Info  Code Status, Allergies Code Status Info: Full Code Allergies Info: Lisinopril;Micardis Telmisartan           Current Medications (10/20/2017):  This is the current hospital active medication list Current Facility-Administered Medications  Medication Dose Route Frequency Provider Last Rate Last Dose  . acetaminophen (TYLENOL) tablet 650 mg  650 mg Oral Q6H PRN Emokpae, Ejiroghene E, MD   650 mg at 10/18/17 5621   Or  . acetaminophen (TYLENOL) suppository 650 mg  650 mg Rectal Q6H PRN Emokpae, Ejiroghene E, MD      . carvedilol (COREG) tablet 12.5 mg  12.5 mg Oral BID WC Arrien, York Ram, MD   12.5 mg at 10/20/17 0840  . donepezil (ARICEPT) tablet 10 mg  10 mg Oral QHS Emokpae, Ejiroghene E, MD   10 mg at 10/19/17 2030  . furosemide (LASIX) tablet 80 mg  80 mg Oral BID Coralie Keens, MD   80 mg at 10/20/17 3086  . haloperidol (HALDOL) 2 MG/ML solution 1 mg  1 mg Oral Q4H PRN Arrien, York Ram, MD   1 mg at 10/19/17 2031  . haloperidol lactate (HALDOL) injection 1 mg  1 mg Intramuscular Q4H PRN Arrien, York Ram, MD      . heparin injection 5,000 Units  5,000 Units Subcutaneous Q8H Emokpae, Heloise Beecham, MD  5,000 Units at 10/20/17 0506  . memantine (NAMENDA XR) 24 hr capsule 28 mg  28 mg Oral QHS Emokpae, Ejiroghene E, MD   28 mg at 10/19/17 2030  . ondansetron (ZOFRAN) tablet 4 mg  4 mg Oral Q6H PRN Emokpae, Ejiroghene E, MD       Or  . ondansetron (ZOFRAN) injection 4 mg  4 mg Intravenous Q6H PRN Emokpae, Ejiroghene E, MD      . spironolactone (ALDACTONE) tablet 25 mg  25 mg Oral Daily Arrien, York Ram, MD   25 mg at 10/20/17 0840  . traZODone (DESYREL) tablet 50 mg  50 mg Oral QHS PRN Emokpae, Ejiroghene E, MD   50 mg at 10/19/17 2030     Discharge Medications: Please see discharge summary for a list  of discharge medications.  Relevant Imaging Results:  Relevant Lab Results:   Additional Information SSN [id number]  Antionette Poles, LCSW

## 2017-10-21 DIAGNOSIS — Z6835 Body mass index (BMI) 35.0-35.9, adult: Secondary | ICD-10-CM | POA: Diagnosis not present

## 2017-10-21 DIAGNOSIS — I350 Nonrheumatic aortic (valve) stenosis: Secondary | ICD-10-CM | POA: Diagnosis not present

## 2017-10-21 DIAGNOSIS — I5023 Acute on chronic systolic (congestive) heart failure: Secondary | ICD-10-CM | POA: Diagnosis not present

## 2017-10-22 DIAGNOSIS — I5023 Acute on chronic systolic (congestive) heart failure: Secondary | ICD-10-CM | POA: Diagnosis not present

## 2017-10-22 DIAGNOSIS — R5383 Other fatigue: Secondary | ICD-10-CM | POA: Diagnosis not present

## 2017-10-22 DIAGNOSIS — I350 Nonrheumatic aortic (valve) stenosis: Secondary | ICD-10-CM | POA: Diagnosis not present

## 2017-10-23 DIAGNOSIS — I5023 Acute on chronic systolic (congestive) heart failure: Secondary | ICD-10-CM | POA: Diagnosis not present

## 2017-10-23 DIAGNOSIS — F0390 Unspecified dementia without behavioral disturbance: Secondary | ICD-10-CM | POA: Diagnosis not present

## 2017-10-23 DIAGNOSIS — I350 Nonrheumatic aortic (valve) stenosis: Secondary | ICD-10-CM | POA: Diagnosis not present

## 2017-10-23 DIAGNOSIS — R5383 Other fatigue: Secondary | ICD-10-CM | POA: Diagnosis not present

## 2017-10-26 DIAGNOSIS — I5023 Acute on chronic systolic (congestive) heart failure: Secondary | ICD-10-CM | POA: Diagnosis not present

## 2017-10-26 DIAGNOSIS — D72829 Elevated white blood cell count, unspecified: Secondary | ICD-10-CM | POA: Diagnosis not present

## 2017-10-26 DIAGNOSIS — R5383 Other fatigue: Secondary | ICD-10-CM | POA: Diagnosis not present

## 2017-10-26 DIAGNOSIS — I998 Other disorder of circulatory system: Secondary | ICD-10-CM | POA: Diagnosis not present

## 2017-10-27 DIAGNOSIS — I5023 Acute on chronic systolic (congestive) heart failure: Secondary | ICD-10-CM | POA: Diagnosis not present

## 2017-10-27 DIAGNOSIS — I998 Other disorder of circulatory system: Secondary | ICD-10-CM | POA: Diagnosis not present

## 2017-10-27 DIAGNOSIS — I350 Nonrheumatic aortic (valve) stenosis: Secondary | ICD-10-CM | POA: Diagnosis not present

## 2017-10-27 DIAGNOSIS — R5383 Other fatigue: Secondary | ICD-10-CM | POA: Diagnosis not present

## 2017-11-05 ENCOUNTER — Ambulatory Visit: Admit: 2017-11-05 | Payer: Medicare Other | Admitting: Ophthalmology

## 2017-11-05 SURGERY — PHACOEMULSIFICATION, CATARACT, WITH IOL INSERTION
Anesthesia: Choice | Laterality: Right

## 2017-11-26 DEATH — deceased

## 2019-07-05 IMAGING — CR DG CHEST 2V
2 series · 2 of 2 positions shown · non-contrast
Comparison: Radiographs September 26, 2013.

CLINICAL DATA: Shortness of breath.

EXAM:
CHEST - 2 VIEW

[w chest lat]
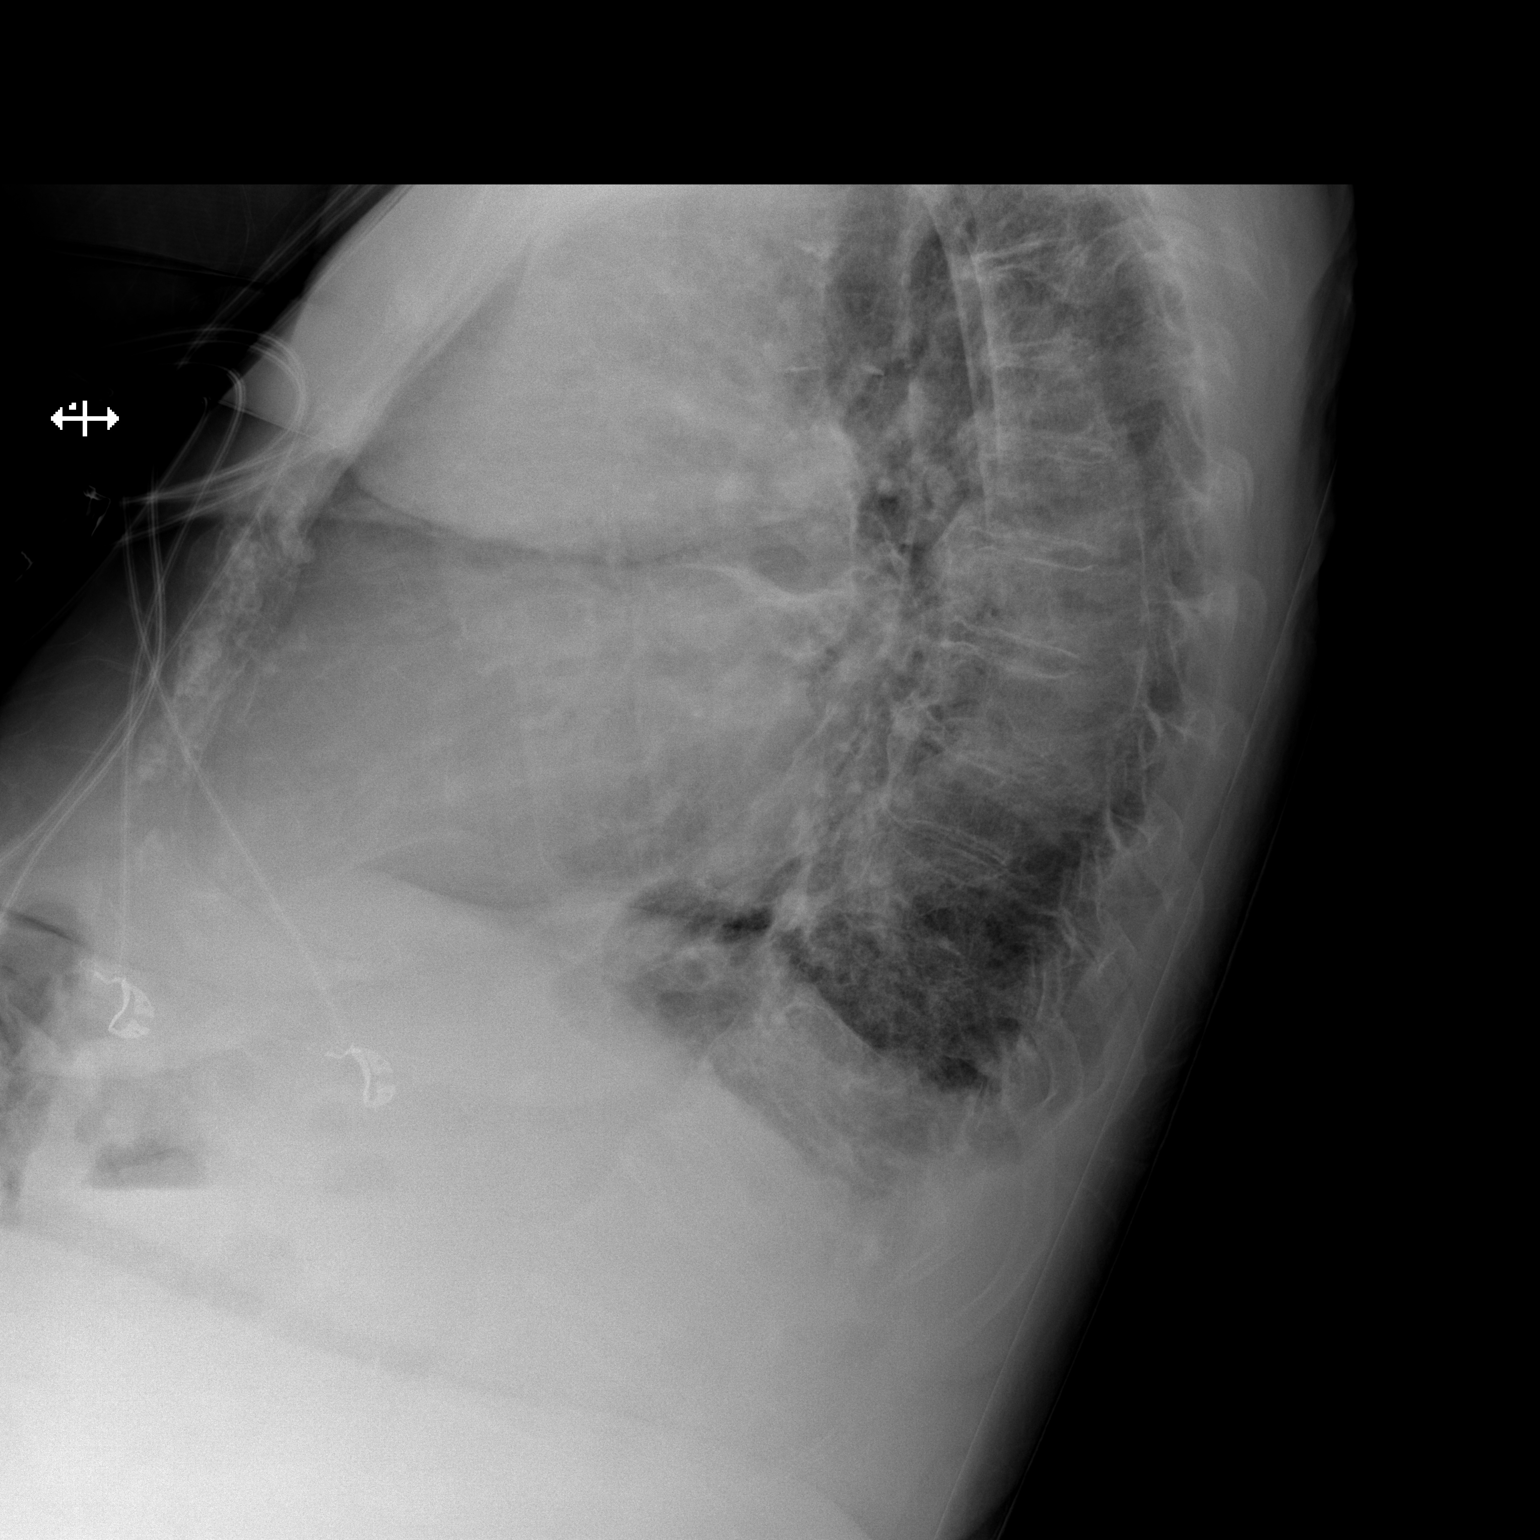

[x chest ap]
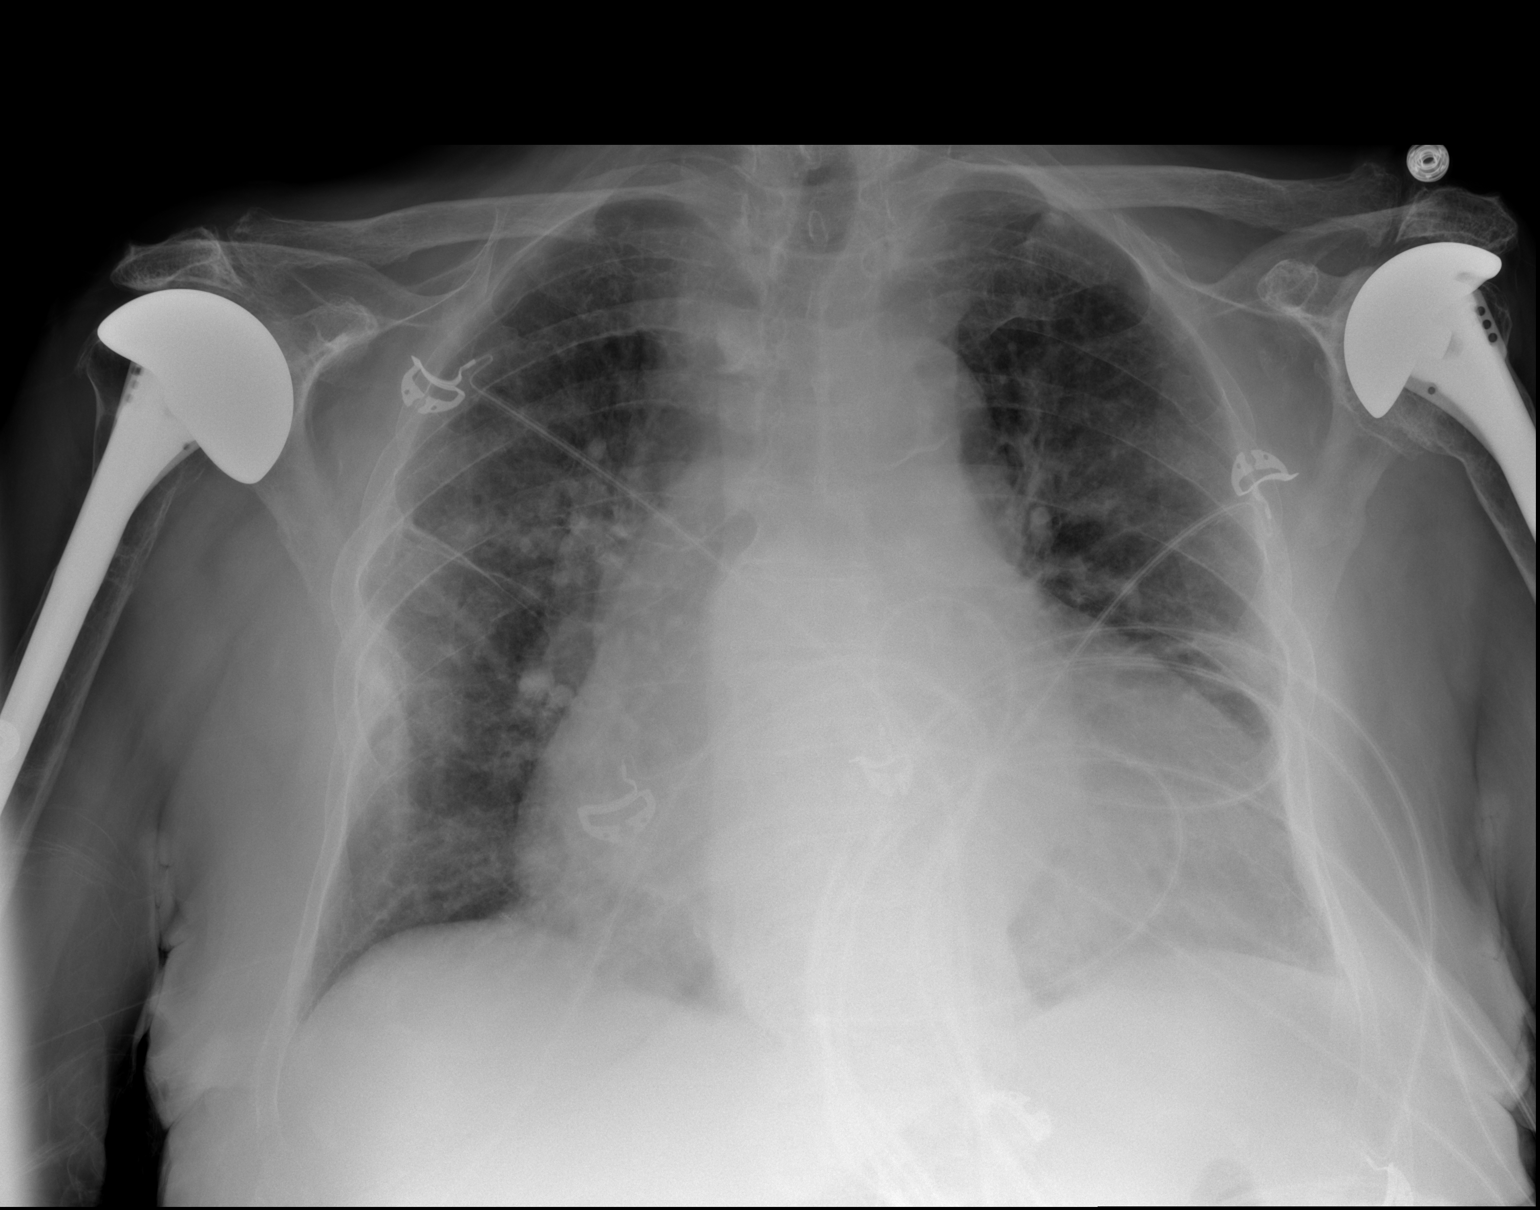

[2 of 2 positions shown; findings below may reference images not displayed]

FINDINGS: Stable cardiomegaly with central pulmonary vascular congestion is
noted. No pneumothorax is noted. Small bilateral pleural effusions
are noted. No consolidative process is noted. Status post bilateral
shoulder arthroplasties.
IMPRESSION: Stable cardiomegaly with mild central pulmonary vascular congestion.
Minimal bilateral pleural effusions are noted.
